# Patient Record
Sex: Female | Born: 1950 | Race: White | Hispanic: No | Marital: Married | Smoking: Former smoker
Health system: Southern US, Community
[De-identification: ages and names within clinical notes are randomized; demographics above are authoritative.]

## PROBLEM LIST (undated history)

## (undated) DIAGNOSIS — M5136 Other intervertebral disc degeneration, lumbar region: Secondary | ICD-10-CM

## (undated) DIAGNOSIS — M1990 Unspecified osteoarthritis, unspecified site: Secondary | ICD-10-CM

## (undated) DIAGNOSIS — K219 Gastro-esophageal reflux disease without esophagitis: Secondary | ICD-10-CM

## (undated) DIAGNOSIS — R569 Unspecified convulsions: Secondary | ICD-10-CM

## (undated) DIAGNOSIS — E119 Type 2 diabetes mellitus without complications: Secondary | ICD-10-CM

## (undated) DIAGNOSIS — R32 Unspecified urinary incontinence: Secondary | ICD-10-CM

## (undated) DIAGNOSIS — IMO0001 Reserved for inherently not codable concepts without codable children: Secondary | ICD-10-CM

## (undated) DIAGNOSIS — K589 Irritable bowel syndrome without diarrhea: Secondary | ICD-10-CM

## (undated) DIAGNOSIS — F32A Depression, unspecified: Secondary | ICD-10-CM

## (undated) DIAGNOSIS — F419 Anxiety disorder, unspecified: Secondary | ICD-10-CM

## (undated) DIAGNOSIS — F329 Major depressive disorder, single episode, unspecified: Secondary | ICD-10-CM

## (undated) DIAGNOSIS — I639 Cerebral infarction, unspecified: Secondary | ICD-10-CM

## (undated) DIAGNOSIS — M797 Fibromyalgia: Secondary | ICD-10-CM

## (undated) DIAGNOSIS — E669 Obesity, unspecified: Secondary | ICD-10-CM

## (undated) DIAGNOSIS — M51369 Other intervertebral disc degeneration, lumbar region without mention of lumbar back pain or lower extremity pain: Secondary | ICD-10-CM

## (undated) DIAGNOSIS — I10 Essential (primary) hypertension: Secondary | ICD-10-CM

## (undated) DIAGNOSIS — E782 Mixed hyperlipidemia: Secondary | ICD-10-CM

## (undated) DIAGNOSIS — R079 Chest pain, unspecified: Secondary | ICD-10-CM

## (undated) DIAGNOSIS — J449 Chronic obstructive pulmonary disease, unspecified: Secondary | ICD-10-CM

## (undated) DIAGNOSIS — J45909 Unspecified asthma, uncomplicated: Secondary | ICD-10-CM

## (undated) DIAGNOSIS — G629 Polyneuropathy, unspecified: Secondary | ICD-10-CM

## (undated) HISTORY — PX: TONSILLECTOMY: SUR1361

## (undated) HISTORY — DX: Essential (primary) hypertension: I10

## (undated) HISTORY — DX: Unspecified urinary incontinence: R32

## (undated) HISTORY — DX: Mixed hyperlipidemia: E78.2

## (undated) HISTORY — DX: Obesity, unspecified: E66.9

## (undated) HISTORY — PX: OTHER SURGICAL HISTORY: SHX169

## (undated) HISTORY — DX: Chronic obstructive pulmonary disease, unspecified: J44.9

## (undated) HISTORY — PX: CHOLECYSTECTOMY: SHX55

## (undated) HISTORY — DX: Irritable bowel syndrome, unspecified: K58.9

## (undated) HISTORY — PX: CARPAL TUNNEL RELEASE: SHX101

## (undated) HISTORY — DX: Unspecified osteoarthritis, unspecified site: M19.90

## (undated) HISTORY — DX: Fibromyalgia: M79.7

## (undated) HISTORY — DX: Type 2 diabetes mellitus without complications: E11.9

## (undated) HISTORY — DX: Chest pain, unspecified: R07.9

## (undated) HISTORY — DX: Gastro-esophageal reflux disease without esophagitis: K21.9

---

## 2002-09-13 ENCOUNTER — Encounter: Admission: RE | Admit: 2002-09-13 | Discharge: 2002-12-12 | Payer: Self-pay

## 2004-08-06 ENCOUNTER — Ambulatory Visit: Payer: Self-pay | Admitting: Internal Medicine

## 2004-08-06 ENCOUNTER — Inpatient Hospital Stay (HOSPITAL_COMMUNITY): Admission: AD | Admit: 2004-08-06 | Discharge: 2004-08-10 | Payer: Self-pay | Admitting: Cardiology

## 2005-05-13 ENCOUNTER — Ambulatory Visit: Payer: Self-pay | Admitting: *Deleted

## 2005-12-13 ENCOUNTER — Ambulatory Visit: Payer: Self-pay | Admitting: Cardiology

## 2006-04-28 ENCOUNTER — Ambulatory Visit: Payer: Self-pay | Admitting: Cardiology

## 2007-03-14 ENCOUNTER — Ambulatory Visit: Payer: Self-pay | Admitting: Cardiology

## 2009-02-22 ENCOUNTER — Ambulatory Visit: Payer: Self-pay | Admitting: Cardiology

## 2009-08-19 ENCOUNTER — Ambulatory Visit: Payer: Self-pay | Admitting: Cardiology

## 2009-12-08 ENCOUNTER — Emergency Department (HOSPITAL_COMMUNITY): Admission: EM | Admit: 2009-12-08 | Discharge: 2009-12-08 | Payer: Self-pay | Admitting: Emergency Medicine

## 2010-01-28 ENCOUNTER — Ambulatory Visit: Payer: Self-pay | Admitting: Cardiology

## 2010-11-05 NOTE — Discharge Summary (Signed)
NAME:  Valerie Wagner, Valerie Wagner NO.:  [id number]   MEDICAL RECORD NO.:  [id number]          PATIENT TYPE:  INP   LOCATION:  4714                         FACILITY:  [facility]   PHYSICIAN:  Charlies Constable, M.D. [facility] DATE OF BIRTH:  June 03, 1951   DATE OF ADMISSION:  08/06/2004  DATE OF DISCHARGE:  08/10/2004                           DISCHARGE SUMMARY - REFERRING   SUMMARY OF HISTORY:  Ms. Alton is a 60 year old white female who presented  with a sudden onset of chest discomfort while at rest which she described as  a sharp pressing substernal discomfort radiating to her left arm associated  with shortness of breath, diaphoresis, nausea, vomiting and palpitations.  Discomfort lasted approximately 30 minutes and she initially attributed this  to unpacking from her move. She presented to the ER, however, symptoms were  relieved with nitroglycerin. She ruled out for myocardial infarction at  [facility].  Due to her multiple risk factors, it was felt that she  should undergo cardiac catheterization.   History is notable for prior cardiac catheterization approximately three to  four years ago at [facility] which was unremarkable. An  adenosine Cardiolite in March 2005, showed EF of 66%, no ischemia. She also  has a history of hypertension, hyperlipidemia, diabetes, morbid obesity and  inactivity.   LABORATORY DATA:  Fasting lipids showed a total cholesterol 232,  triglycerides 311, HDL 35, LDL 135. Admission sodium was 139, potassium 3.9,  BUN 13, creatinine 1.0, glucose 94.  PT 13.0, PTT 26.  H&H 12.8 and 37.6,  normal indices, platelets 297, WBCs 9.5. Subsequent blood work was  essentially unchanged.   Chest two-view showed mild bronchitic changes, questioned 20 lung nodules,  recommend follow-up CT scan.  CT was performed and showed a 5 mm nodular  opacity in the lingula too small to completely characterize.  Felt that it  was benign process in the  absence cancer history, recommend follow-up CT  scan.   HOSPITAL COURSE:  Ms. Mascari was transferred to [facility] for  further evaluation.  She did not have any further problems with chest  discomfort or shortness of breath.  On August 09, 2004, Dr. Riley Kill  performed cardiac catheterization which did not show any coronary artery  disease with EF greater than 60%.  Findings were discussed with Dr. Reuel Boom.  CT was ordered as previously described.  This was negative for acute  processes.  Thus, it was felt that she could be discharged home with  continued cardiac risk factor modifications with her primary care physician.   DISCHARGE DIAGNOSES:  1.  Noncardiac chest discomfort without coronary artery disease on cardiac      catheterization.  2.  Abnormal chest x-ray with CT as previously described, history as      previously.   DISPOSITION:  She is discharged home. She was asked to continue her home  medications. These include:  1.  Xanax 0.5 mg q.8h. p.r.n.  2.  Relafen 750 mg b.i.d.  3.  Flexeril 10 mg q.8h. p.r.n.  4.  __________ 500 mg b.i.d.  5.  Lotensin/HCT  20/25 daily.  6.  Aspirin 81 daily.  7.  Resume Glucophage XR 500 mg four tablets daily on Wednesday.  8.  Advair 500/50 one puff b.i.d.  9.  Lipitor 80 mg q.h.s.  10. Albuterol and Atrovent as previously.   She was advised no lifting, driving, sexual activity or heavy exertion for  two days.  Maintain low salt, fat, cholesterol diet. If she had any problems  with the catheterization site, she was asked to call. She was also asked to  arrange a follow-up appointment with Dr. Reuel Boom and a noncontrast chest CT  in approximately three months for follow-up of her abnormal chest x-ray.      EW/MEDQ  D:  11/02/2004  T:  11/02/2004  Job:  [id number]   cc:   Donzetta Sprung  [address]  [place]  [place] [address]  Fax: [phone]

## 2010-11-05 NOTE — Cardiovascular Report (Signed)
Valerie Wagner, Valerie Wagner NO.:  [id number]   MEDICAL RECORD NO.:  [id number]          PATIENT TYPE:  INP   LOCATION:  4714                         FACILITY:  [facility]   PHYSICIAN:  Arturo Morton. Riley Kill, M.D. [facility] OF BIRTH:  10-03-1950   DATE OF PROCEDURE:  08/09/2004  DATE OF DISCHARGE:                              CARDIAC CATHETERIZATION   INDICATIONS:  Ms. Cliff is a 60 year old who has presented with recurrent  episodes of chest pain. The patient has non-insulin-dependent diabetes  mellitus and hypertension compatible with an adult dysmetabolic profile. She  has significant obesity. The current study was done to assess coronary  anatomy.   PROCEDURES:  1.  Left heart catheterization  2.  Selective coronary arteriography.  3.  Selective left ventriculography.   DESCRIPTION OF THE PROCEDURE:  The patient was brought to the  catheterization laboratory and prepped and draped in the usual fashion. We  were able to use an anterior puncture to gain access to the femoral artery.  We had some difficulty passing the wire. We therefore then used a Smart  needle and were able to gain immediate access and a wire was passed into the  central aorta. A 6-French sheath was placed. Central aortic and left atrial  pressures were measured with a pigtail. Ventriculography was performed in  the RAO projection. Following ventriculography the pigtail was pulled back  and removed. Coronary arteriography was then performed without complication.   Following this, she was taken to the holding area in satisfactory clinical  condition for sheath removal.   HEMODYNAMIC DATA:  1.  Central aortic pressure 130/80, mean 102.  2.  Left ventricle 120/12.  3.  No gradient pullback across aortic valve.   ANGIOGRAPHIC DATA:  1.  Ventriculography was performed in the RAO projection because of      ventricular ectopy. Ejection fraction could not be calculated, but      appeared to be vigorous. No  definite wall motion abnormalities were      seen.  2.  The left main was free of critical disease.  3.  The LAD courses to the apex. There is minimal luminal irregularity, but      no significant focal areas of stenosis. The LAD wraps the apex. There is      insignificant diagonal branches and a major septal perforator.  4.  The circumflex provides a first marginal branch and then a second      marginal branch and a fairly large AV circumflex that provides a      posterolateral wall. No significant __________ obstruction is noted.  5.  The right coronary artery provides a single PDA. The RCA is without      significant focal narrowing.   CONCLUSIONS:  1.  Preserved left ventricular function.  2.  Recurrent episodes of substernal chest pain.  3.  Well-preserved left ventricular function.  4.  No critical coronary obstruction.   DISPOSITION:  1.  Supportive treatment.  2.  Check D-dimer.  3.  The patient does have an abnormal CT with small lung nodules which need  a three-month follow-up. Follow-up will be arranged with her primary      care physician, Dr. Reuel Boom.      TDS/MEDQ  D:  08/09/2004  T:  08/09/2004  Job:  [id number]   cc:   Donzetta Sprung  [address]  [place]  [place] [address]  Fax: [phone]   CV Lab   Arvilla Meres, M.D. [facility]

## 2011-12-21 DIAGNOSIS — R079 Chest pain, unspecified: Secondary | ICD-10-CM

## 2011-12-23 DIAGNOSIS — R079 Chest pain, unspecified: Secondary | ICD-10-CM

## 2012-01-06 ENCOUNTER — Encounter: Payer: Self-pay | Admitting: Cardiology

## 2012-01-16 ENCOUNTER — Encounter: Payer: Medicaid Other | Admitting: Cardiology

## 2012-02-09 ENCOUNTER — Encounter: Payer: Self-pay | Admitting: Cardiology

## 2012-02-09 ENCOUNTER — Ambulatory Visit (INDEPENDENT_AMBULATORY_CARE_PROVIDER_SITE_OTHER): Payer: Medicaid Other | Admitting: Cardiology

## 2012-02-09 VITALS — BP 131/71 | HR 66 | Ht 63.0 in | Wt 289.0 lb

## 2012-02-09 DIAGNOSIS — I10 Essential (primary) hypertension: Secondary | ICD-10-CM

## 2012-02-09 DIAGNOSIS — R0789 Other chest pain: Secondary | ICD-10-CM

## 2012-02-09 DIAGNOSIS — E782 Mixed hyperlipidemia: Secondary | ICD-10-CM

## 2012-02-09 NOTE — Progress Notes (Signed)
Clinical Summary Ms. Shevchenko is a 61 y.o.female presenting for hospital followup. She was seen in consultation back in July with atypical chest pain at [facility]. She ruled out for myocardial nfarction and underwent a surface echocardiogram demonstrating LVEF of 60% without wall motion abnormalities or significant valvular abnormalities. She was not able to tolerate a dobutamine echocardiogram related to significant nausea and emesis, and the study was canceled.  She presents today stating that she feels much better, has not had any recurrent chest pain symptoms. We discussed the possibility of ultimately considering a followup Lexiscan Myoview for assessment of ischemia, however at this point adopted the strategy of observation since her symptoms had resolved, and she has had fairly consistent reassuring cardiac testing over time. Recommended continued followup in the interim with Dr. Reuel Boom for risk factor modification.  Allergies  Allergen Reactions  . Bee Venom   . Chocolate   . Ivp Dye (Iodinated Diagnostic Agents)   . Nsaids     Shortness of breath.   . Penicillins     Rash   . Propoxyphene And Methadone     Current Outpatient Prescriptions  Medication Sig Dispense Refill  . ALPRAZolam (XANAX) 0.5 MG tablet Take 0.5 mg by mouth 3 (three) times daily as needed.      Marland Kitchen atorvastatin (LIPITOR) 80 MG tablet Take 80 mg by mouth daily.      . benazepril-hydrochlorthiazide (LOTENSIN HCT) 20-25 MG per tablet Take 1 tablet by mouth daily.      Marland Kitchen diltiazem (CARDIZEM CD) 240 MG 24 hr capsule Take 240 mg by mouth daily.      . Fluticasone-Salmeterol (ADVAIR) 500-50 MCG/DOSE AEPB Inhale 1 puff into the lungs every 12 (twelve) hours.      Marland Kitchen glipiZIDE (GLUCOTROL XL) 10 MG 24 hr tablet Take 10 mg by mouth daily.      Marland Kitchen HYDROcodone-acetaminophen (NORCO/VICODIN) 5-325 MG per tablet Take 1 tablet by mouth 4 times daily as needed.      Marland Kitchen ipratropium (ATROVENT HFA) 17 MCG/ACT inhaler Inhale 1 puff into  the lungs 4 (four) times daily as needed.      Marland Kitchen ipratropium-albuterol (DUONEB) 0.5-2.5 (3) MG/3ML SOLN Take 3 mLs by nebulization every 6 (six) hours as needed.      . nabumetone (RELAFEN) 750 MG tablet Take 750 mg by mouth 2 (two) times daily.      . nitroGLYCERIN (NITROSTAT) 0.4 MG SL tablet Place 0.4 mg under the tongue every 5 (five) minutes as needed.      . pantoprazole (PROTONIX) 40 MG tablet Take 40 mg by mouth daily.      . pioglitazone (ACTOS) 45 MG tablet Take 45 mg by mouth daily.      . solifenacin (VESICARE) 10 MG tablet Take 5 mg by mouth daily.      Marland Kitchen tiotropium (SPIRIVA) 18 MCG inhalation capsule Place 18 mcg into inhaler and inhale daily.      . traMADol (ULTRAM) 50 MG tablet Take 50 mg by mouth every 8 (eight) hours as needed.        Past Medical History  Diagnosis Date  . Chest pain     Reassuring workup of her time including negative cardiac catheterization and stress testing  . Type 2 diabetes mellitus   . Mixed hyperlipidemia   . Obesity   . Essential hypertension, benign   . COPD (chronic obstructive pulmonary disease)   . DJD (degenerative joint disease)   . Urinary incontinence   . GERD (  gastroesophageal reflux disease)   . IBS (irritable bowel syndrome)   . Fibromyalgia     Social History Ms. Ransome reports that she quit smoking about 29 years ago. Her smoking use included Cigarettes. She has a 2 pack-year smoking history. She has never used smokeless tobacco. Ms. Leitch reports that she does not drink alcohol.  Review of Systems No palpitations, dizziness, syncope. Otherwise negative.  Physical Examination Filed Vitals:   02/09/12 1417  BP: 131/71  Pulse: 66   Obese woman in no acute distress. HEENT: Conjunctiva and lids normal, oropharynx clear. Neck: Supple, no elevated JVP or carotid bruits, no thyromegaly. Lungs: Clear to auscultation, nonlabored breathing at rest. Cardiac: Regular rate and rhythm, no S3 or significant systolic murmur, no  pericardial rub. Abdomen: Soft, nontender, bowel sounds present, no guarding or rebound. Extremities: No pitting edema, distal pulses 2+.    Problem List and Plan   Atypical chest pain Presently resolved. Although she does have multiple cardiac risk factors, previous testing has been reassuring including both angiography and noninvasive imaging. At this point would recommend observation, and follow up with Dr. Reuel Boom for risk factor modification. If she has progressive symptoms, we can consider a followup Lexiscan Myoview.  Essential hypertension, benign Blood pressure is reasonable today. Continue medical therapy.  Mixed hyperlipidemia On Lipitor. Goal LDL should be close to 70 for aggressive management.    Jonelle Sidle, M.D., F.A.C.C.

## 2012-02-09 NOTE — Assessment & Plan Note (Signed)
Presently resolved. Although she does have multiple cardiac risk factors, previous testing has been reassuring including both angiography and noninvasive imaging. At this point would recommend observation, and follow up with Dr. Reuel Boom for risk factor modification. If she has progressive symptoms, we can consider a followup Lexiscan Myoview.

## 2012-02-09 NOTE — Assessment & Plan Note (Signed)
Blood pressure is reasonable today. Continue medical therapy.

## 2012-02-09 NOTE — Assessment & Plan Note (Signed)
On Lipitor. Goal LDL should be close to 70 for aggressive management.

## 2012-02-09 NOTE — Patient Instructions (Addendum)

## 2012-08-09 ENCOUNTER — Ambulatory Visit: Payer: Medicaid Other | Admitting: Cardiology

## 2012-09-24 ENCOUNTER — Ambulatory Visit (INDEPENDENT_AMBULATORY_CARE_PROVIDER_SITE_OTHER): Payer: Medicaid Other | Admitting: Cardiology

## 2012-09-24 ENCOUNTER — Encounter: Payer: Self-pay | Admitting: Cardiology

## 2012-09-24 VITALS — BP 164/92 | HR 74 | Ht 63.0 in | Wt 294.8 lb

## 2012-09-24 DIAGNOSIS — R0789 Other chest pain: Secondary | ICD-10-CM

## 2012-09-24 DIAGNOSIS — E782 Mixed hyperlipidemia: Secondary | ICD-10-CM

## 2012-09-24 DIAGNOSIS — I10 Essential (primary) hypertension: Secondary | ICD-10-CM

## 2012-09-24 NOTE — Patient Instructions (Addendum)

## 2012-09-24 NOTE — Assessment & Plan Note (Signed)
Blood pressure is elevated today. Weight loss would be beneficial, also sodium restriction. Continue medical therapy and follow up with Dr. Reuel Boom.

## 2012-09-24 NOTE — Progress Notes (Signed)
Clinical Summary Valerie Wagner is a 62 y.o.female last seen in August 2013. Reports no significant chest pain symptoms, has not used any nitroglycerin since I last saw her.  We have discussed possibility of a followup Lexiscan Myoview were she to have progressive chest pain symptoms, and this has not been the case. Previous cardiac testing was reassuring.  Continue to recommend basic risk factor modification, and followup with Dr. Reuel Boom. She sees him on a regular basis.  Allergies  Allergen Reactions  . Bee Venom   . Chocolate   . Ivp Dye (Iodinated Diagnostic Agents)   . Nsaids     Shortness of breath.   . Penicillins     Rash   . Propoxyphene And Methadone     Current Outpatient Prescriptions  Medication Sig Dispense Refill  . ALPRAZolam (XANAX) 0.5 MG tablet Take 0.5 mg by mouth 3 (three) times daily as needed.      Marland Kitchen atorvastatin (LIPITOR) 80 MG tablet Take 80 mg by mouth daily.      . benazepril-hydrochlorthiazide (LOTENSIN HCT) 20-12.5 MG per tablet Take 1 tablet by mouth daily.      Marland Kitchen diltiazem (CARDIZEM CD) 240 MG 24 hr capsule Take 240 mg by mouth daily.      Marland Kitchen EPINEPHrine (EPIPEN JR) 0.15 MG/0.3ML injection Inject 0.15 mg into the muscle as needed for anaphylaxis.      Marland Kitchen Fluticasone-Salmeterol (ADVAIR) 500-50 MCG/DOSE AEPB Inhale 1 puff into the lungs every 12 (twelve) hours.      Marland Kitchen HYDROcodone-acetaminophen (NORCO/VICODIN) 5-325 MG per tablet Take 1 tablet by mouth 4 times daily as needed.      . insulin glargine (LANTUS) 100 UNIT/ML injection Inject 22 Units into the skin at bedtime.      Marland Kitchen ipratropium (ATROVENT HFA) 17 MCG/ACT inhaler Inhale 1 puff into the lungs 4 (four) times daily as needed.      Marland Kitchen ipratropium-albuterol (DUONEB) 0.5-2.5 (3) MG/3ML SOLN Take 3 mLs by nebulization every 6 (six) hours as needed.      . metFORMIN (GLUCOPHAGE) 500 MG tablet Take 2,000 mg by mouth daily.      . nabumetone (RELAFEN) 750 MG tablet Take 1,500 mg by mouth daily.       .  nitroGLYCERIN (NITROSTAT) 0.4 MG SL tablet Place 0.4 mg under the tongue every 5 (five) minutes as needed.      . pantoprazole (PROTONIX) 40 MG tablet Take 40 mg by mouth daily.      . pregabalin (LYRICA) 50 MG capsule Take 50 mg by mouth 2 (two) times daily.      Marland Kitchen tiotropium (SPIRIVA) 18 MCG inhalation capsule Place 18 mcg into inhaler and inhale daily.      . traMADol (ULTRAM) 50 MG tablet Take 50 mg by mouth every 8 (eight) hours as needed.       No current facility-administered medications for this visit.    Past Medical History  Diagnosis Date  . Chest pain     Reassuring workup of her time including negative cardiac catheterization and stress testing  . Type 2 diabetes mellitus   . Mixed hyperlipidemia   . Obesity   . Essential hypertension, benign   . COPD (chronic obstructive pulmonary disease)   . DJD (degenerative joint disease)   . Urinary incontinence   . GERD (gastroesophageal reflux disease)   . IBS (irritable bowel syndrome)   . Fibromyalgia     Social History Valerie Wagner reports that she quit smoking about 30  years ago. Her smoking use included Cigarettes. She has a 2 pack-year smoking history. She has never used smokeless tobacco. Valerie Wagner reports that she does not drink alcohol.  Review of Systems No palpitations. Reports an episode of amnesia recently after a stressful situation. States that she is having this further evaluated. Also to see Dr. Andrey Campanile for possible sleep testing. Otherwise negative.  Physical Examination Filed Vitals:   09/24/12 1348  BP: 164/92  Pulse: 74   Filed Weights   09/24/12 1348  Weight: 294 lb 12.8 oz (133.72 kg)    Obese woman in no acute distress.  HEENT: Conjunctiva and lids normal, oropharynx clear.  Neck: Supple, no elevated JVP or carotid bruits, no thyromegaly.  Lungs: Clear to auscultation, nonlabored breathing at rest.  Cardiac: Regular rate and rhythm, no S3 or significant systolic murmur, no pericardial rub.    Abdomen: Soft, nontender, bowel sounds present, no guarding or rebound.  Extremities: No pitting edema, distal pulses 2+.   Problem List and Plan   Atypical chest pain Has not been recurrent or progressive. Continue to recommend risk factor modification, followup with Dr. Reuel Boom. Followup stress testing could be considered if her symptoms were to progress.  Essential hypertension, benign Blood pressure is elevated today. Weight loss would be beneficial, also sodium restriction. Continue medical therapy and follow up with Dr. Reuel Boom.  Mixed hyperlipidemia She continues on high-dose Lipitor, followed by Dr. Reuel Boom. With diabetes, LDL should be under 100.    Jonelle Sidle, M.D., F.A.C.C.

## 2012-09-24 NOTE — Assessment & Plan Note (Signed)
Has not been recurrent or progressive. Continue to recommend risk factor modification, followup with Dr. Reuel Boom. Followup stress testing could be considered if her symptoms were to progress.

## 2012-09-24 NOTE — Assessment & Plan Note (Signed)
She continues on high-dose Lipitor, followed by Dr. Reuel Boom. With diabetes, LDL should be under 100.

## 2013-09-24 ENCOUNTER — Emergency Department (HOSPITAL_COMMUNITY): Payer: Medicaid Other

## 2013-09-24 ENCOUNTER — Encounter (HOSPITAL_COMMUNITY): Payer: Self-pay | Admitting: Emergency Medicine

## 2013-09-24 ENCOUNTER — Emergency Department (HOSPITAL_COMMUNITY)
Admission: EM | Admit: 2013-09-24 | Discharge: 2013-09-25 | Disposition: A | Discharge status: Home / Self Care | Payer: Medicaid Other | Attending: Emergency Medicine | Admitting: Emergency Medicine

## 2013-09-24 DIAGNOSIS — Z8719 Personal history of other diseases of the digestive system: Secondary | ICD-10-CM | POA: Insufficient documentation

## 2013-09-24 DIAGNOSIS — J45901 Unspecified asthma with (acute) exacerbation: Principal | ICD-10-CM

## 2013-09-24 DIAGNOSIS — Z9889 Other specified postprocedural states: Secondary | ICD-10-CM | POA: Insufficient documentation

## 2013-09-24 DIAGNOSIS — J40 Bronchitis, not specified as acute or chronic: Secondary | ICD-10-CM

## 2013-09-24 DIAGNOSIS — E119 Type 2 diabetes mellitus without complications: Secondary | ICD-10-CM | POA: Insufficient documentation

## 2013-09-24 DIAGNOSIS — IMO0002 Reserved for concepts with insufficient information to code with codable children: Secondary | ICD-10-CM | POA: Insufficient documentation

## 2013-09-24 DIAGNOSIS — Z8742 Personal history of other diseases of the female genital tract: Secondary | ICD-10-CM | POA: Insufficient documentation

## 2013-09-24 DIAGNOSIS — Z87891 Personal history of nicotine dependence: Secondary | ICD-10-CM | POA: Insufficient documentation

## 2013-09-24 DIAGNOSIS — Z8669 Personal history of other diseases of the nervous system and sense organs: Secondary | ICD-10-CM | POA: Insufficient documentation

## 2013-09-24 DIAGNOSIS — Z880 Allergy status to penicillin: Secondary | ICD-10-CM | POA: Insufficient documentation

## 2013-09-24 DIAGNOSIS — Z79899 Other long term (current) drug therapy: Secondary | ICD-10-CM | POA: Insufficient documentation

## 2013-09-24 DIAGNOSIS — Z794 Long term (current) use of insulin: Secondary | ICD-10-CM | POA: Insufficient documentation

## 2013-09-24 DIAGNOSIS — E785 Hyperlipidemia, unspecified: Secondary | ICD-10-CM | POA: Insufficient documentation

## 2013-09-24 DIAGNOSIS — J441 Chronic obstructive pulmonary disease with (acute) exacerbation: Secondary | ICD-10-CM | POA: Insufficient documentation

## 2013-09-24 DIAGNOSIS — I10 Essential (primary) hypertension: Secondary | ICD-10-CM | POA: Insufficient documentation

## 2013-09-24 DIAGNOSIS — E669 Obesity, unspecified: Secondary | ICD-10-CM | POA: Insufficient documentation

## 2013-09-24 HISTORY — DX: Polyneuropathy, unspecified: G62.9

## 2013-09-24 HISTORY — DX: Unspecified asthma, uncomplicated: J45.909

## 2013-09-24 LAB — CBC WITH DIFFERENTIAL/PLATELET
BASOS ABS: 0 10*3/uL (ref 0.0–0.1)
Basophils Relative: 0 % (ref 0–1)
Eosinophils Absolute: 0.5 10*3/uL (ref 0.0–0.7)
Eosinophils Relative: 4 % (ref 0–5)
HEMATOCRIT: 39.3 % (ref 36.0–46.0)
Hemoglobin: 13.4 g/dL (ref 12.0–15.0)
LYMPHS ABS: 4.5 10*3/uL — AB (ref 0.7–4.0)
Lymphocytes Relative: 42 % (ref 12–46)
MCH: 31.1 pg (ref 26.0–34.0)
MCHC: 34.1 g/dL (ref 30.0–36.0)
MCV: 91.2 fL (ref 78.0–100.0)
Monocytes Absolute: 0.4 10*3/uL (ref 0.1–1.0)
Monocytes Relative: 4 % (ref 3–12)
NEUTROS ABS: 5.4 10*3/uL (ref 1.7–7.7)
Neutrophils Relative %: 50 % (ref 43–77)
PLATELETS: 256 10*3/uL (ref 150–400)
RBC: 4.31 MIL/uL (ref 3.87–5.11)
RDW: 12.1 % (ref 11.5–15.5)
WBC: 10.9 10*3/uL — AB (ref 4.0–10.5)

## 2013-09-24 LAB — COMPREHENSIVE METABOLIC PANEL
ALK PHOS: 88 U/L (ref 39–117)
ALT: 25 U/L (ref 0–35)
AST: 22 U/L (ref 0–37)
Albumin: 3.9 g/dL (ref 3.5–5.2)
BILIRUBIN TOTAL: 0.2 mg/dL — AB (ref 0.3–1.2)
BUN: 14 mg/dL (ref 6–23)
CHLORIDE: 98 meq/L (ref 96–112)
CO2: 27 meq/L (ref 19–32)
Calcium: 10.1 mg/dL (ref 8.4–10.5)
Creatinine, Ser: 0.76 mg/dL (ref 0.50–1.10)
GFR calc Af Amer: >90 mL/min (ref >90)
GFR calc non Af Amer: 90 mL/min — ABNORMAL LOW (ref >90)
Glucose, Bld: 385 mg/dL — ABNORMAL HIGH (ref 70–99)
Potassium: 3.9 mEq/L (ref 3.7–5.3)
Sodium: 141 mEq/L (ref 137–147)
Total Protein: 7.4 g/dL (ref 6.0–8.3)

## 2013-09-24 LAB — TROPONIN I: Troponin I: <0.3 ng/mL (ref <0.30)

## 2013-09-24 LAB — PRO B NATRIURETIC PEPTIDE: Pro B Natriuretic peptide (BNP): 239.8 pg/mL — ABNORMAL HIGH (ref 0–125)

## 2013-09-24 MED ORDER — LORAZEPAM 2 MG/ML IJ SOLN
0.5000 mg | Freq: Once | INTRAMUSCULAR | Status: AC
  Administered 2013-09-24: 0.5 mg via INTRAVENOUS
  Filled 2013-09-24: qty 1

## 2013-09-24 MED ORDER — IPRATROPIUM-ALBUTEROL 0.5-2.5 (3) MG/3ML IN SOLN
3.0000 mL | RESPIRATORY_TRACT | Status: DC
  Filled 2013-09-24: qty 3

## 2013-09-24 MED ORDER — LEVOFLOXACIN 500 MG PO TABS: 500.0000 mg | ORAL_TABLET | Freq: Every day | ORAL | Status: DC

## 2013-09-24 MED ORDER — IPRATROPIUM-ALBUTEROL 0.5-2.5 (3) MG/3ML IN SOLN
3.0000 mL | Freq: Once | RESPIRATORY_TRACT | Status: AC
  Administered 2013-09-24: 3 mL via RESPIRATORY_TRACT

## 2013-09-24 MED ORDER — RACEPINEPHRINE HCL 2.25 % IN NEBU
0.5000 mL | INHALATION_SOLUTION | Freq: Once | RESPIRATORY_TRACT | Status: AC
  Administered 2013-09-24: 0.5 mL via RESPIRATORY_TRACT
  Filled 2013-09-24: qty 0.5

## 2013-09-24 MED ORDER — LEVOFLOXACIN 500 MG PO TABS
500.0000 mg | ORAL_TABLET | Freq: Once | ORAL | Status: AC
  Administered 2013-09-24: 500 mg via ORAL
  Filled 2013-09-24: qty 1

## 2013-09-24 MED ORDER — METHYLPREDNISOLONE SODIUM SUCC 125 MG IJ SOLR
125.0000 mg | Freq: Once | INTRAMUSCULAR | Status: AC
  Administered 2013-09-24: 125 mg via INTRAVENOUS
  Filled 2013-09-24: qty 2

## 2013-09-24 MED ORDER — ALBUTEROL SULFATE (2.5 MG/3ML) 0.083% IN NEBU
2.5000 mg | INHALATION_SOLUTION | Freq: Once | RESPIRATORY_TRACT | Status: AC
  Administered 2013-09-24: 2.5 mg via RESPIRATORY_TRACT
  Filled 2013-09-24: qty 3

## 2013-09-24 MED ORDER — PREDNISONE 20 MG PO TABS: ORAL_TABLET | ORAL | Status: DC

## 2013-09-24 NOTE — ED Notes (Signed)
Pt reports SOB began about noon.  Reports that a cigar triggered an asthma attack.  States that she has been using both her nebulizer and inhalers with no relief.

## 2013-09-24 NOTE — ED Provider Notes (Signed)
CSN: [id number]     Arrival date & time 09/24/13  2056 History  This chart was scribed for Benny Lennert, MD by Blanchard Kelch, ED Scribe. The patient was seen in room APA10/APA10. Patient's care was started at 9:49 PM.    Chief Complaint  Patient presents with  . Shortness of Breath     Patient is a 63 y.o. female presenting with shortness of breath. The history is provided by the patient. No language interpreter was used.  Shortness of Breath Duration:  10 hours Timing:  Constant Progression:  Unchanged Relieved by:  Nothing Ineffective treatments:  Inhaler Associated symptoms: chest pain (chest wall) and cough   Associated symptoms: no abdominal pain, no headaches and no rash     HPI Comments: Valerie Wagner is a 63 y.o. female who presents to the Emergency Department complaining of constant shortness of breath that began ten hours ago. She has had associated productive cough with yellow/green sputum and chills. She also reports chest wall tenderness from coughing. She used her nebulizer and inhaler at home without relief.    Past Medical History  Diagnosis Date  . Chest pain     Reassuring workup of her time including negative cardiac catheterization and stress testing  . Type 2 diabetes mellitus   . Mixed hyperlipidemia   . Obesity   . Essential hypertension, benign   . COPD (chronic obstructive pulmonary disease)   . DJD (degenerative joint disease)   . Urinary incontinence   . GERD (gastroesophageal reflux disease)   . IBS (irritable bowel syndrome)   . Fibromyalgia   . Asthma   . Neuropathy    Past Surgical History  Procedure Laterality Date  . Tonsillectomy    . Hysterectomy -- unknown    . Cholecystectomy    . Carpal tunnel release      Right  . Left thumb surgery    . Cesarean section     Family History  Problem Relation Age of Onset  . Coronary artery disease Father   . Heart attack Father     Died age 91   History  Substance Use Topics  .  Smoking status: Former Smoker -- 1.00 packs/day for 2 years    Types: Cigarettes    Quit date: 06/20/1982  . Smokeless tobacco: Never Used  . Alcohol Use: No   OB History   Grav Para Term Preterm Abortions TAB SAB Ect Mult Living                 Review of Systems  Constitutional: Positive for chills. Negative for appetite change and fatigue.  HENT: Negative for congestion, ear discharge and sinus pressure.   Eyes: Negative for discharge.  Respiratory: Positive for cough and shortness of breath.   Cardiovascular: Positive for chest pain (chest wall).  Gastrointestinal: Negative for abdominal pain and diarrhea.  Genitourinary: Negative for frequency and hematuria.  Musculoskeletal: Negative for back pain.  Skin: Negative for rash.  Neurological: Negative for seizures and headaches.  Psychiatric/Behavioral: Negative for hallucinations.      Allergies  Nsaids; Bee venom; Chocolate; Darvon; Ivp Wagner; and Penicillins  Home Medications   Current Outpatient Rx  Name  Route  Sig  Dispense  Refill  . albuterol (VENTOLIN HFA) 108 (90 BASE) MCG/ACT inhaler   Inhalation   Inhale 1 puff into the lungs every 6 (six) hours as needed for wheezing or shortness of breath.         . ALPRAZolam Prudy Feeler)  0.5 MG tablet   Oral   Take 0.5 mg by mouth 3 (three) times daily as needed for anxiety or sleep.          Marland Kitchen. atorvastatin (LIPITOR) 80 MG tablet   Oral   Take 80 mg by mouth daily.         . benazepril-hydrochlorthiazide (LOTENSIN HCT) 20-12.5 MG per tablet   Oral   Take 1 tablet by mouth daily.         . cyclobenzaprine (FLEXERIL) 10 MG tablet   Oral   Take 10 mg by mouth at bedtime as needed for muscle spasms.         Marland Kitchen. EPINEPHrine (EPIPEN JR) 0.15 MG/0.3ML injection   Intramuscular   Inject 0.15 mg into the muscle as needed for anaphylaxis.         Marland Kitchen. Fluticasone-Salmeterol (ADVAIR) 500-50 MCG/DOSE AEPB   Inhalation   Inhale 1 puff into the lungs every 12 (twelve)  hours.         Marland Kitchen. glipiZIDE (GLUCOTROL XL) 10 MG 24 hr tablet   Oral   Take 10 mg by mouth daily with breakfast.         . HYDROcodone-acetaminophen (NORCO/VICODIN) 5-325 MG per tablet   Oral   Take 1 tablet by mouth 4 times daily as needed for moderate pain or severe pain.          Marland Kitchen. insulin glargine (LANTUS) 100 UNIT/ML injection   Subcutaneous   Inject 30 Units into the skin at bedtime.          Marland Kitchen. ipratropium (ATROVENT HFA) 17 MCG/ACT inhaler   Inhalation   Inhale 1 puff into the lungs 4 (four) times daily as needed.         Marland Kitchen. ipratropium-albuterol (DUONEB) 0.5-2.5 (3) MG/3ML SOLN   Nebulization   Take 3 mLs by nebulization every 6 (six) hours as needed (for shortness of breath).          . nitroGLYCERIN (NITROSTAT) 0.4 MG SL tablet   Sublingual   Place 0.4 mg under the tongue every 5 (five) minutes as needed.         . pregabalin (LYRICA) 50 MG capsule   Oral   Take 50 mg by mouth 2 (two) times daily.         Marland Kitchen. tiotropium (SPIRIVA) 18 MCG inhalation capsule   Inhalation   Place 18 mcg into inhaler and inhale daily.         . traMADol (ULTRAM) 50 MG tablet   Oral   Take 50 mg by mouth every 8 (eight) hours as needed for moderate pain.           Triage Vitals: BP 149/79  Pulse 76  Temp(Src) 97.9 F (36.6 C) (Oral)  Resp 26  Ht 5\' 3"  (1.6 m)  Wt 295 lb (133.811 kg)  BMI 52.27 kg/m2  SpO2 97%  Physical Exam  Nursing note and vitals reviewed. Constitutional: She is oriented to person, place, and time. She appears well-developed.  Obese  HENT:  Head: Normocephalic.  Eyes: Conjunctivae and EOM are normal. No scleral icterus.  Neck: Neck supple. No thyromegaly present.  Cardiovascular: Normal rate and regular rhythm.  Exam reveals no gallop and no friction rub.   No murmur heard. Pulmonary/Chest: No stridor. She exhibits no tenderness.  Moderate stridor.  Abdominal: She exhibits no distension. There is no tenderness. There is no rebound.   Musculoskeletal: Normal range of motion. She exhibits no edema.  Lymphadenopathy:    She has no cervical adenopathy.  Neurological: She is oriented to person, place, and time. She exhibits normal muscle tone. Coordination normal.  Skin: No rash noted. No erythema.  Psychiatric: She has a normal mood and affect. Her behavior is normal.    ED Course  Procedures (including critical care time)  DIAGNOSTIC STUDIES: Oxygen Saturation is 97% on room air, adequate by my interpretation.    COORDINATION OF CARE: 10:33 PM -Ordered CBC, CMP, Troponin I, BNP, EKG and chest x-ray. Patient verbalizes understanding and agrees with treatment plan.    Labs Review Labs Reviewed  CBC WITH DIFFERENTIAL - Abnormal; Notable for the following:    WBC 10.9 (*)    Lymphs Abs 4.5 (*)    All other components within normal limits  COMPREHENSIVE METABOLIC PANEL  TROPONIN I  PRO B NATRIURETIC PEPTIDE   Imaging Review Dg Chest Portable 1 View  09/24/2013   CLINICAL DATA:  Shortness of breath and wheezing. History of asthma and smoking.  EXAM: PORTABLE CHEST - 1 VIEW  COMPARISON:  Chest radiograph performed 07/06/2013  FINDINGS: The lungs are well-aerated. Mild vascular congestion is noted. There is no evidence of pleural effusion or pneumothorax.  The cardiomediastinal silhouette is within normal limits. No acute osseous abnormalities are seen.  IMPRESSION: Mild vascular congestion noted; lungs remain grossly clear.   Electronically Signed   By: Roanna Raider M.D.   On: 09/24/2013 22:13     EKG Interpretation None      MDM   Final diagnoses:  None    Pt improved with tx.  Follow up with pcp in 2 days.  tx with levaquin and prednisone The chart was scribed for me under my direct supervision.  I personally performed the history, physical, and medical decision making and all procedures in the evaluation of this patient.Benny Lennert, MD 09/24/13 [phone]

## 2013-09-24 NOTE — Discharge Instructions (Signed)
Follow up with your md in 2 days for recheck 

## 2013-09-25 MED ORDER — IPRATROPIUM-ALBUTEROL 0.5-2.5 (3) MG/3ML IN SOLN
RESPIRATORY_TRACT | Status: AC
  Filled 2013-09-25: qty 3

## 2013-09-25 NOTE — ED Notes (Signed)
Pt discharged at 0030 on 09/25/13. Unable to document at that time due to system downtime.

## 2013-10-14 ENCOUNTER — Emergency Department (HOSPITAL_COMMUNITY)
Admission: EM | Admit: 2013-10-14 | Discharge: 2013-10-14 | Disposition: A | Discharge status: Home / Self Care | Payer: Medicaid Other | Attending: Emergency Medicine | Admitting: Emergency Medicine

## 2013-10-14 ENCOUNTER — Emergency Department (HOSPITAL_COMMUNITY): Payer: Medicaid Other

## 2013-10-14 ENCOUNTER — Encounter (HOSPITAL_COMMUNITY): Payer: Self-pay | Admitting: Emergency Medicine

## 2013-10-14 DIAGNOSIS — Z79899 Other long term (current) drug therapy: Secondary | ICD-10-CM | POA: Insufficient documentation

## 2013-10-14 DIAGNOSIS — Z794 Long term (current) use of insulin: Secondary | ICD-10-CM | POA: Insufficient documentation

## 2013-10-14 DIAGNOSIS — J449 Chronic obstructive pulmonary disease, unspecified: Secondary | ICD-10-CM | POA: Insufficient documentation

## 2013-10-14 DIAGNOSIS — J4489 Other specified chronic obstructive pulmonary disease: Secondary | ICD-10-CM | POA: Insufficient documentation

## 2013-10-14 DIAGNOSIS — N200 Calculus of kidney: Secondary | ICD-10-CM | POA: Insufficient documentation

## 2013-10-14 DIAGNOSIS — E669 Obesity, unspecified: Secondary | ICD-10-CM | POA: Insufficient documentation

## 2013-10-14 DIAGNOSIS — E119 Type 2 diabetes mellitus without complications: Secondary | ICD-10-CM | POA: Insufficient documentation

## 2013-10-14 DIAGNOSIS — I10 Essential (primary) hypertension: Secondary | ICD-10-CM | POA: Insufficient documentation

## 2013-10-14 DIAGNOSIS — Z8669 Personal history of other diseases of the nervous system and sense organs: Secondary | ICD-10-CM | POA: Insufficient documentation

## 2013-10-14 DIAGNOSIS — E785 Hyperlipidemia, unspecified: Secondary | ICD-10-CM | POA: Insufficient documentation

## 2013-10-14 DIAGNOSIS — Z880 Allergy status to penicillin: Secondary | ICD-10-CM | POA: Insufficient documentation

## 2013-10-14 DIAGNOSIS — M1990 Unspecified osteoarthritis, unspecified site: Secondary | ICD-10-CM | POA: Insufficient documentation

## 2013-10-14 DIAGNOSIS — Z87891 Personal history of nicotine dependence: Secondary | ICD-10-CM | POA: Insufficient documentation

## 2013-10-14 DIAGNOSIS — Z8719 Personal history of other diseases of the digestive system: Secondary | ICD-10-CM | POA: Insufficient documentation

## 2013-10-14 DIAGNOSIS — Z792 Long term (current) use of antibiotics: Secondary | ICD-10-CM | POA: Insufficient documentation

## 2013-10-14 DIAGNOSIS — IMO0002 Reserved for concepts with insufficient information to code with codable children: Secondary | ICD-10-CM | POA: Insufficient documentation

## 2013-10-14 LAB — CBC WITH DIFFERENTIAL/PLATELET
Basophils Absolute: 0.1 10*3/uL (ref 0.0–0.1)
Basophils Relative: 0 % (ref 0–1)
EOS ABS: 0.5 10*3/uL (ref 0.0–0.7)
Eosinophils Relative: 4 % (ref 0–5)
HEMATOCRIT: 43.6 % (ref 36.0–46.0)
HEMOGLOBIN: 14.5 g/dL (ref 12.0–15.0)
LYMPHS ABS: 3.9 10*3/uL (ref 0.7–4.0)
Lymphocytes Relative: 32 % (ref 12–46)
MCH: 30.3 pg (ref 26.0–34.0)
MCHC: 33.3 g/dL (ref 30.0–36.0)
MCV: 91 fL (ref 78.0–100.0)
MONOS PCT: 4 % (ref 3–12)
Monocytes Absolute: 0.5 10*3/uL (ref 0.1–1.0)
NEUTROS PCT: 60 % (ref 43–77)
Neutro Abs: 7.4 10*3/uL (ref 1.7–7.7)
Platelets: 294 10*3/uL (ref 150–400)
RBC: 4.79 MIL/uL (ref 3.87–5.11)
RDW: 12.4 % (ref 11.5–15.5)
WBC: 12.4 10*3/uL — ABNORMAL HIGH (ref 4.0–10.5)

## 2013-10-14 LAB — COMPREHENSIVE METABOLIC PANEL
ALK PHOS: 95 U/L (ref 39–117)
ALT: 36 U/L — ABNORMAL HIGH (ref 0–35)
AST: 38 U/L — ABNORMAL HIGH (ref 0–37)
Albumin: 3.9 g/dL (ref 3.5–5.2)
BILIRUBIN TOTAL: 0.3 mg/dL (ref 0.3–1.2)
BUN: 12 mg/dL (ref 6–23)
CHLORIDE: 100 meq/L (ref 96–112)
CO2: 27 mEq/L (ref 19–32)
Calcium: 10.3 mg/dL (ref 8.4–10.5)
Creatinine, Ser: 0.68 mg/dL (ref 0.50–1.10)
GFR calc non Af Amer: >90 mL/min (ref >90)
GLUCOSE: 248 mg/dL — AB (ref 70–99)
Potassium: 3.9 mEq/L (ref 3.7–5.3)
Sodium: 141 mEq/L (ref 137–147)
TOTAL PROTEIN: 7.2 g/dL (ref 6.0–8.3)

## 2013-10-14 LAB — URINALYSIS, ROUTINE W REFLEX MICROSCOPIC
Bilirubin Urine: NEGATIVE
GLUCOSE, UA: 500 mg/dL — AB
HGB URINE DIPSTICK: NEGATIVE
KETONES UR: NEGATIVE mg/dL
LEUKOCYTES UA: NEGATIVE
Nitrite: NEGATIVE
PH: 5.5 (ref 5.0–8.0)
Protein, ur: NEGATIVE mg/dL
Specific Gravity, Urine: 1.025 (ref 1.005–1.030)
Urobilinogen, UA: 0.2 mg/dL (ref 0.0–1.0)

## 2013-10-14 LAB — CBG MONITORING, ED: Glucose-Capillary: 296 mg/dL — ABNORMAL HIGH (ref 70–99)

## 2013-10-14 MED ORDER — TAMSULOSIN HCL 0.4 MG PO CAPS: 0.4000 mg | ORAL_CAPSULE | Freq: Every day | ORAL | Status: DC

## 2013-10-14 MED ORDER — HYDROMORPHONE HCL PF 1 MG/ML IJ SOLN
1.0000 mg | Freq: Once | INTRAMUSCULAR | Status: AC
  Administered 2013-10-14: 1 mg via INTRAMUSCULAR
  Filled 2013-10-14: qty 1

## 2013-10-14 MED ORDER — HYDROMORPHONE HCL PF 2 MG/ML IJ SOLN
2.0000 mg | Freq: Once | INTRAMUSCULAR | Status: AC
  Administered 2013-10-14: 2 mg via INTRAMUSCULAR
  Filled 2013-10-14: qty 1

## 2013-10-14 MED ORDER — ONDANSETRON 4 MG PO TBDP: ORAL_TABLET | ORAL | Status: DC

## 2013-10-14 MED ORDER — OXYCODONE-ACETAMINOPHEN 5-325 MG PO TABS
1.0000 | ORAL_TABLET | Freq: Four times a day (QID) | ORAL | Status: DC | PRN
Start: 2013-10-14 — End: 2015-06-25

## 2013-10-14 NOTE — ED Provider Notes (Signed)
CSN: [id number]     Arrival date & time 10/14/13  1634 History  This chart was scribed for Valerie LennertJoseph L Latrecia Capito, MD by Beverly MilchJ Harrison Collins, ED Scribe. This patient was seen in room APA19/APA19 and the patient's care was started at 9:19 PM.    Chief Complaint  Patient presents with  . Flank Pain    Patient is a 63 y.o. female presenting with flank pain. The history is provided by the patient. No language interpreter was used.  Flank Pain This is a new problem. The current episode started more than 2 days ago. The problem occurs constantly. Pertinent negatives include no chest pain, no abdominal pain and no headaches. The symptoms are aggravated by bending, twisting, exertion and walking. The symptoms are relieved by lying down and rest. She has tried nothing for the symptoms. The treatment provided no relief.  Pt reports experiencing mild burning on urination and decreased urine output.   Past Medical History  Diagnosis Date  . Chest pain     Reassuring workup of her time including negative cardiac catheterization and stress testing  . Type 2 diabetes mellitus   . Mixed hyperlipidemia   . Obesity   . Essential hypertension, benign   . COPD (chronic obstructive pulmonary disease)   . DJD (degenerative joint disease)   . Urinary incontinence   . GERD (gastroesophageal reflux disease)   . IBS (irritable bowel syndrome)   . Fibromyalgia   . Asthma   . Neuropathy     Past Surgical History  Procedure Laterality Date  . Tonsillectomy    . Hysterectomy -- unknown    . Cholecystectomy    . Carpal tunnel release      Right  . Left thumb surgery    . Cesarean section      Family History  Problem Relation Age of Onset  . Coronary artery disease Father   . Heart attack Father     Died age 61    History  Substance Use Topics  . Smoking status: Former Smoker -- 1.00 packs/day for 2 years    Types: Cigarettes    Quit date: 06/20/1982  . Smokeless tobacco: Never Used  . Alcohol Use:  No    OB History   Grav Para Term Preterm Abortions TAB SAB Ect Mult Living                  Review of Systems  Constitutional: Negative for appetite change and fatigue.  HENT: Negative for congestion, ear discharge and sinus pressure.   Eyes: Negative for discharge.  Respiratory: Negative for cough.   Cardiovascular: Negative for chest pain.  Gastrointestinal: Negative for abdominal pain and diarrhea.  Genitourinary: Positive for dysuria, flank pain and decreased urine volume. Negative for frequency and hematuria.  Musculoskeletal: Negative for back pain.  Skin: Negative for rash.  Neurological: Negative for seizures and headaches.  Psychiatric/Behavioral: Negative for hallucinations.      Allergies  Nsaids; Bee venom; Chocolate; Darvon; Ivp dye; and Penicillins  Home Medications   Prior to Admission medications   Medication Sig Start Date End Date Taking? Authorizing Provider  albuterol (VENTOLIN HFA) 108 (90 BASE) MCG/ACT inhaler Inhale 1 puff into the lungs every 6 (six) hours as needed for wheezing or shortness of breath.    Historical Provider, MD  ALPRAZolam Prudy Feeler(XANAX) 0.5 MG tablet Take 0.5 mg by mouth 3 (three) times daily as needed for anxiety or sleep.     Historical Provider, MD  atorvastatin (LIPITOR)  80 MG tablet Take 80 mg by mouth daily.    Historical Provider, MD  benazepril-hydrochlorthiazide (LOTENSIN HCT) 20-12.5 MG per tablet Take 1 tablet by mouth daily.    Historical Provider, MD  cyclobenzaprine (FLEXERIL) 10 MG tablet Take 10 mg by mouth at bedtime as needed for muscle spasms.    Historical Provider, MD  EPINEPHrine (EPIPEN JR) 0.15 MG/0.3ML injection Inject 0.15 mg into the muscle as needed for anaphylaxis.    Historical Provider, MD  Fluticasone-Salmeterol (ADVAIR) 500-50 MCG/DOSE AEPB Inhale 1 puff into the lungs every 12 (twelve) hours.    Historical Provider, MD  glipiZIDE (GLUCOTROL XL) 10 MG 24 hr tablet Take 10 mg by mouth daily with breakfast.     Historical Provider, MD  HYDROcodone-acetaminophen (NORCO/VICODIN) 5-325 MG per tablet Take 1 tablet by mouth 4 times daily as needed for moderate pain or severe pain.  11/28/11   Historical Provider, MD  insulin glargine (LANTUS) 100 UNIT/ML injection Inject 30 Units into the skin at bedtime.     Historical Provider, MD  ipratropium (ATROVENT HFA) 17 MCG/ACT inhaler Inhale 1 puff into the lungs 4 (four) times daily as needed.    Historical Provider, MD  ipratropium-albuterol (DUONEB) 0.5-2.5 (3) MG/3ML SOLN Take 3 mLs by nebulization every 6 (six) hours as needed (for shortness of breath).     Historical Provider, MD  levofloxacin (LEVAQUIN) 500 MG tablet Take 1 tablet (500 mg total) by mouth daily. 09/24/13   Valerie Lennert, MD  nitroGLYCERIN (NITROSTAT) 0.4 MG SL tablet Place 0.4 mg under the tongue every 5 (five) minutes as needed.    Historical Provider, MD  predniSONE (DELTASONE) 20 MG tablet 2 tabs po daily x 3 days 09/24/13   Valerie Lennert, MD  pregabalin (LYRICA) 50 MG capsule Take 50 mg by mouth 2 (two) times daily.    Historical Provider, MD  tiotropium (SPIRIVA) 18 MCG inhalation capsule Place 18 mcg into inhaler and inhale daily.    Historical Provider, MD  traMADol (ULTRAM) 50 MG tablet Take 50 mg by mouth every 8 (eight) hours as needed for moderate pain.     Historical Provider, MD   Triage Vitals: BP 155/85  Pulse 91  Temp(Src) 97.7 F (36.5 C) (Oral)  Resp 22  Ht 5\' 3"  (1.6 m)  Wt 283 lb (128.368 kg)  BMI 50.14 kg/m2  SpO2 100%  Physical Exam  Nursing note and vitals reviewed. Constitutional: She is oriented to person, place, and time. She appears well-developed.  HENT:  Head: Normocephalic.  Eyes: Conjunctivae and EOM are normal. No scleral icterus.  Neck: Neck supple. No thyromegaly present.  Cardiovascular: Normal rate and regular rhythm.  Exam reveals no gallop and no friction rub.   No murmur heard. Pulmonary/Chest: No stridor. She has no wheezes. She has no rales.  She exhibits no tenderness.  Abdominal: She exhibits no distension. There is tenderness (Moderate left flank tenderness). There is no rebound.  Musculoskeletal: Normal range of motion. She exhibits no edema.  Lymphadenopathy:    She has no cervical adenopathy.  Neurological: She is oriented to person, place, and time. She exhibits normal muscle tone. Coordination normal.  Skin: No rash noted. No erythema.  Psychiatric: She has a normal mood and affect. Her behavior is normal.    ED Course  Procedures (including critical care time)  DIAGNOSTIC STUDIES: Oxygen Saturation is 100% on RA, normal by my interpretation.    COORDINATION OF CARE: 9:23 PM- Pt advised of plan for treatment  and pt agrees.   Labs Review Labs Reviewed  URINALYSIS, ROUTINE W REFLEX MICROSCOPIC - Abnormal; Notable for the following:    APPearance CLOUDY (*)    Glucose, UA 500 (*)    All other components within normal limits  CBG MONITORING, ED - Abnormal; Notable for the following:    Glucose-Capillary 296 (*)    All other components within normal limits  CBG MONITORING, ED    Imaging Review No results found.   EKG Interpretation None      MDM   Final diagnoses:  None   Kidney stone The chart was scribed for me under my direct supervision.  I personally performed the history, physical, and medical decision making and all procedures in the evaluation of this patient.Valerie Wagner.   Harbour Nordmeyer L Wilbert Schouten, MD 10/14/13 [phone]

## 2013-10-14 NOTE — ED Notes (Signed)
Pt c/o left flank pain and dysuria x 3 days.

## 2013-10-17 ENCOUNTER — Encounter: Payer: Medicaid Other | Admitting: Cardiology

## 2013-10-17 NOTE — Progress Notes (Signed)
Patient canceled.  This encounter was created in error - please disregard. 

## 2013-11-19 ENCOUNTER — Encounter: Payer: Self-pay | Admitting: Cardiology

## 2013-11-19 ENCOUNTER — Encounter: Payer: Medicaid Other | Admitting: Cardiology

## 2013-11-19 NOTE — Progress Notes (Signed)
No show  This encounter was created in error - please disregard.

## 2014-04-06 ENCOUNTER — Emergency Department (HOSPITAL_COMMUNITY)
Admission: EM | Admit: 2014-04-06 | Discharge: 2014-04-07 | Disposition: A | Discharge status: Home / Self Care | Payer: Medicaid Other | Attending: Emergency Medicine | Admitting: Emergency Medicine

## 2014-04-06 ENCOUNTER — Encounter (HOSPITAL_COMMUNITY): Payer: Self-pay | Admitting: Emergency Medicine

## 2014-04-06 ENCOUNTER — Emergency Department (HOSPITAL_COMMUNITY): Payer: Medicaid Other

## 2014-04-06 DIAGNOSIS — R51 Headache: Secondary | ICD-10-CM | POA: Diagnosis not present

## 2014-04-06 DIAGNOSIS — E782 Mixed hyperlipidemia: Secondary | ICD-10-CM | POA: Insufficient documentation

## 2014-04-06 DIAGNOSIS — Z87891 Personal history of nicotine dependence: Secondary | ICD-10-CM | POA: Diagnosis not present

## 2014-04-06 DIAGNOSIS — Z794 Long term (current) use of insulin: Secondary | ICD-10-CM | POA: Insufficient documentation

## 2014-04-06 DIAGNOSIS — E119 Type 2 diabetes mellitus without complications: Secondary | ICD-10-CM | POA: Diagnosis not present

## 2014-04-06 DIAGNOSIS — Z880 Allergy status to penicillin: Secondary | ICD-10-CM | POA: Diagnosis not present

## 2014-04-06 DIAGNOSIS — R2242 Localized swelling, mass and lump, left lower limb: Secondary | ICD-10-CM

## 2014-04-06 DIAGNOSIS — I10 Essential (primary) hypertension: Secondary | ICD-10-CM | POA: Diagnosis not present

## 2014-04-06 DIAGNOSIS — M549 Dorsalgia, unspecified: Secondary | ICD-10-CM | POA: Diagnosis not present

## 2014-04-06 DIAGNOSIS — R1032 Left lower quadrant pain: Secondary | ICD-10-CM | POA: Insufficient documentation

## 2014-04-06 DIAGNOSIS — E669 Obesity, unspecified: Secondary | ICD-10-CM | POA: Diagnosis not present

## 2014-04-06 DIAGNOSIS — R110 Nausea: Secondary | ICD-10-CM | POA: Insufficient documentation

## 2014-04-06 DIAGNOSIS — Z87442 Personal history of urinary calculi: Secondary | ICD-10-CM | POA: Diagnosis not present

## 2014-04-06 DIAGNOSIS — R109 Unspecified abdominal pain: Secondary | ICD-10-CM

## 2014-04-06 DIAGNOSIS — J441 Chronic obstructive pulmonary disease with (acute) exacerbation: Secondary | ICD-10-CM | POA: Insufficient documentation

## 2014-04-06 DIAGNOSIS — Z8719 Personal history of other diseases of the digestive system: Secondary | ICD-10-CM | POA: Diagnosis not present

## 2014-04-06 DIAGNOSIS — Z7951 Long term (current) use of inhaled steroids: Secondary | ICD-10-CM | POA: Insufficient documentation

## 2014-04-06 DIAGNOSIS — Z79899 Other long term (current) drug therapy: Secondary | ICD-10-CM | POA: Diagnosis not present

## 2014-04-06 DIAGNOSIS — G629 Polyneuropathy, unspecified: Secondary | ICD-10-CM | POA: Insufficient documentation

## 2014-04-06 LAB — URINALYSIS, ROUTINE W REFLEX MICROSCOPIC
Bilirubin Urine: NEGATIVE
Glucose, UA: NEGATIVE mg/dL
Hgb urine dipstick: NEGATIVE
KETONES UR: NEGATIVE mg/dL
LEUKOCYTES UA: NEGATIVE
NITRITE: NEGATIVE
Protein, ur: NEGATIVE mg/dL
Specific Gravity, Urine: >1.03 — ABNORMAL HIGH (ref 1.005–1.030)
UROBILINOGEN UA: 0.2 mg/dL (ref 0.0–1.0)
pH: 5.5 (ref 5.0–8.0)

## 2014-04-06 LAB — BASIC METABOLIC PANEL
ANION GAP: 12 (ref 5–15)
BUN: 19 mg/dL (ref 6–23)
CO2: 28 mEq/L (ref 19–32)
Calcium: 10.6 mg/dL — ABNORMAL HIGH (ref 8.4–10.5)
Chloride: 99 mEq/L (ref 96–112)
Creatinine, Ser: 0.77 mg/dL (ref 0.50–1.10)
GFR calc non Af Amer: 89 mL/min — ABNORMAL LOW (ref >90)
Glucose, Bld: 191 mg/dL — ABNORMAL HIGH (ref 70–99)
Potassium: 4 mEq/L (ref 3.7–5.3)
Sodium: 139 mEq/L (ref 137–147)

## 2014-04-06 LAB — CBC WITH DIFFERENTIAL/PLATELET
BASOS PCT: 0 % (ref 0–1)
Basophils Absolute: 0.1 10*3/uL (ref 0.0–0.1)
EOS ABS: 0.7 10*3/uL (ref 0.0–0.7)
Eosinophils Relative: 5 % (ref 0–5)
HCT: 43.2 % (ref 36.0–46.0)
Hemoglobin: 14.5 g/dL (ref 12.0–15.0)
Lymphocytes Relative: 30 % (ref 12–46)
Lymphs Abs: 4.1 10*3/uL — ABNORMAL HIGH (ref 0.7–4.0)
MCH: 30.7 pg (ref 26.0–34.0)
MCHC: 33.6 g/dL (ref 30.0–36.0)
MCV: 91.3 fL (ref 78.0–100.0)
MONO ABS: 0.6 10*3/uL (ref 0.1–1.0)
Monocytes Relative: 4 % (ref 3–12)
NEUTROS ABS: 8.2 10*3/uL — AB (ref 1.7–7.7)
Neutrophils Relative %: 61 % (ref 43–77)
Platelets: 345 10*3/uL (ref 150–400)
RBC: 4.73 MIL/uL (ref 3.87–5.11)
RDW: 12.3 % (ref 11.5–15.5)
WBC: 13.6 10*3/uL — ABNORMAL HIGH (ref 4.0–10.5)

## 2014-04-06 MED ORDER — SODIUM CHLORIDE 0.9 % IV BOLUS (SEPSIS)
500.0000 mL | Freq: Once | INTRAVENOUS | Status: AC
  Administered 2014-04-06: 500 mL via INTRAVENOUS

## 2014-04-06 MED ORDER — HYDROMORPHONE HCL 1 MG/ML IJ SOLN
1.0000 mg | Freq: Once | INTRAMUSCULAR | Status: AC
  Administered 2014-04-06: 1 mg via INTRAVENOUS
  Filled 2014-04-06: qty 1

## 2014-04-06 NOTE — ED Provider Notes (Signed)
CSN: [id number]     Arrival date & time 04/06/14  2121 History  This chart was scribed for Enid SkeensJoshua M Alta Shober, MD by Richarda Overlieichard Holland, ED Scribe. This patient was seen in room APA11/APA11 and the patient's care was started 10:40 PM.    Chief Complaint  Patient presents with  . Flank Pain    The history is provided by the patient. No language interpreter was used.   HPI Comments: Valerie Wagner is a 63 y.o. female with a history of kidney starts presents to the Emergency Department complaining of gradually worsening, constant left sided flank pain that radiates to her abdomen and groin for the past month. She reports she was admitted for the same symptoms at the beginning of September for 5 days. She states that she was diagnosed with mass on kidney. She reports associated nausea and headaches. She reports a PSHx of hysterectomy, cholecystectomy, and appendectomy. Patient says her current symptoms do not feel like her symptoms from kidney stones in the past. She denies cough and SOB as associated symptoms.   Past Medical History  Diagnosis Date  . Chest pain     Reassuring workup of her time including negative cardiac catheterization and stress testing  . Type 2 diabetes mellitus   . Mixed hyperlipidemia   . Obesity   . Essential hypertension, benign   . COPD (chronic obstructive pulmonary disease)   . DJD (degenerative joint disease)   . Urinary incontinence   . GERD (gastroesophageal reflux disease)   . IBS (irritable bowel syndrome)   . Fibromyalgia   . Asthma   . Neuropathy    Past Surgical History  Procedure Laterality Date  . Tonsillectomy    . Hysterectomy -- unknown    . Cholecystectomy    . Carpal tunnel release      Right  . Left thumb surgery    . Cesarean section     Family History  Problem Relation Age of Onset  . Coronary artery disease Father   . Heart attack Father     Died age 61   History  Substance Use Topics  . Smoking status: Former Smoker -- 1.00  packs/day for 2 years    Types: Cigarettes    Quit date: 06/20/1982  . Smokeless tobacco: Never Used  . Alcohol Use: No   OB History   Grav Para Term Preterm Abortions TAB SAB Ect Mult Living                 Review of Systems  Respiratory: Negative for cough and shortness of breath.   Gastrointestinal: Positive for nausea and abdominal pain. Negative for vomiting.  Genitourinary: Positive for flank pain.  Musculoskeletal: Positive for back pain.  Neurological: Positive for headaches.  All other systems reviewed and are negative.     Allergies  Nsaids; Bee venom; Chocolate; Darvon; Ivp dye; and Penicillins  Home Medications   Prior to Admission medications   Medication Sig Start Date End Date Taking? Authorizing Provider  albuterol (VENTOLIN HFA) 108 (90 BASE) MCG/ACT inhaler Inhale 1 puff into the lungs every 6 (six) hours as needed for wheezing or shortness of breath.   Yes Historical Provider, MD  ALPRAZolam Prudy Feeler(XANAX) 0.5 MG tablet Take 0.5 mg by mouth 3 (three) times daily as needed for anxiety or sleep.    Yes Historical Provider, MD  atorvastatin (LIPITOR) 80 MG tablet Take 80 mg by mouth daily.   Yes Historical Provider, MD  benazepril-hydrochlorthiazide (LOTENSIN HCT) 20-12.5 MG  per tablet Take 1 tablet by mouth daily.   Yes Historical Provider, MD  cyclobenzaprine (FLEXERIL) 10 MG tablet Take 10 mg by mouth at bedtime as needed for muscle spasms.   Yes Historical Provider, MD  DULoxetine (CYMBALTA) 60 MG capsule Take 60 mg by mouth daily.   Yes Historical Provider, MD  EPINEPHrine (EPIPEN JR) 0.15 MG/0.3ML injection Inject 0.15 mg into the muscle as needed for anaphylaxis.   Yes Historical Provider, MD  Fluticasone-Salmeterol (ADVAIR) 500-50 MCG/DOSE AEPB Inhale 1 puff into the lungs every 12 (twelve) hours.   Yes Historical Provider, MD  glipiZIDE (GLUCOTROL XL) 10 MG 24 hr tablet Take 10 mg by mouth daily with breakfast.   Yes Historical Provider, MD   HYDROcodone-acetaminophen (NORCO) 7.5-325 MG per tablet Take 1 tablet by mouth every 6 (six) hours as needed for moderate pain.   Yes Historical Provider, MD  insulin glargine (LANTUS) 100 UNIT/ML injection Inject 42 Units into the skin at bedtime.    Yes Historical Provider, MD  ipratropium (ATROVENT HFA) 17 MCG/ACT inhaler Inhale 1 puff into the lungs 4 (four) times daily as needed.   Yes Historical Provider, MD  ipratropium-albuterol (DUONEB) 0.5-2.5 (3) MG/3ML SOLN Take 3 mLs by nebulization every 6 (six) hours as needed (for shortness of breath).    Yes Historical Provider, MD  nitroGLYCERIN (NITROSTAT) 0.4 MG SL tablet Place 0.4 mg under the tongue every 5 (five) minutes as needed.   Yes Historical Provider, MD  potassium chloride SA (KLOR-CON M15) 15 MEQ tablet Take 15 mEq by mouth 2 (two) times daily.   Yes Historical Provider, MD  pregabalin (LYRICA) 50 MG capsule Take 50 mg by mouth 2 (two) times daily.   Yes Historical Provider, MD  tiotropium (SPIRIVA) 18 MCG inhalation capsule Place 18 mcg into inhaler and inhale daily.   Yes Historical Provider, MD  traMADol (ULTRAM) 50 MG tablet Take 50 mg by mouth every 8 (eight) hours as needed for moderate pain.    Yes Historical Provider, MD  HYDROcodone-acetaminophen (NORCO) 5-325 MG per tablet Take 2 tablets by mouth every 4 (four) hours as needed. 04/07/14   Enid SkeensJoshua M Icyss Skog, MD  ondansetron (ZOFRAN ODT) 4 MG disintegrating tablet 4mg  ODT q4 hours prn nausea/vomit 10/14/13   Benny LennertJoseph L Zammit, MD  ondansetron (ZOFRAN ODT) 4 MG disintegrating tablet 4mg  ODT q4 hours prn nausea/vomit 04/07/14   Enid SkeensJoshua M Orvell Careaga, MD  oxyCODONE-acetaminophen (PERCOCET/ROXICET) 5-325 MG per tablet Take 1 tablet by mouth every 6 (six) hours as needed for severe pain. 10/14/13   Benny LennertJoseph L Zammit, MD  tamsulosin (FLOMAX) 0.4 MG CAPS capsule Take 1 capsule (0.4 mg total) by mouth daily. 10/14/13   Benny LennertJoseph L Zammit, MD   BP 131/63  Pulse 76  Temp(Src) 97.9 F (36.6 C) (Oral)   Resp 16  Ht 5\' 3"  (1.6 m)  Wt 264 lb (119.75 kg)  BMI 46.78 kg/m2  SpO2 96% Physical Exam  Nursing note and vitals reviewed. Constitutional: She is oriented to person, place, and time. She appears well-developed and well-nourished.  HENT:  Head: Normocephalic and atraumatic.  Eyes: Pupils are equal, round, and reactive to light. No scleral icterus.  Neck: Neck supple.  Cardiovascular: Normal rate, regular rhythm and normal heart sounds.   Pulmonary/Chest: Effort normal. No respiratory distress. She has wheezes.  Slight expiratory wheeze  Abdominal: Soft. There is no guarding.  No focal tenderness    Musculoskeletal: Normal range of motion. She exhibits tenderness. She exhibits no edema.  Tenderness  in left lower flank   Patient has mild tenderness anterior left mid thigh, no significant swelling, no erythema warmth or sign of infection.   Neurological: She is alert and oriented to person, place, and time.  Skin: Skin is warm and dry.  Psychiatric: She has a normal mood and affect.    ED Course  Procedures  DIAGNOSTIC STUDIES: Oxygen Saturation is 96% on RA, normal by my interpretation.    COORDINATION OF CARE: 10:45 PM Discussed treatment plan with pt at bedside and pt agreed to plan.   Labs Review Labs Reviewed  URINALYSIS, ROUTINE W REFLEX MICROSCOPIC - Abnormal; Notable for the following:    APPearance HAZY (*)    Specific Gravity, Urine >1.030 (*)    All other components within normal limits  BASIC METABOLIC PANEL - Abnormal; Notable for the following:    Glucose, Bld 191 (*)    Calcium 10.6 (*)    GFR calc non Af Amer 89 (*)    All other components within normal limits  CBC WITH DIFFERENTIAL - Abnormal; Notable for the following:    WBC 13.6 (*)    Neutro Abs 8.2 (*)    Lymphs Abs 4.1 (*)    All other components within normal limits    Imaging Review Ct Renal Stone Study  04/07/2014   CLINICAL DATA:  Gradually worsening constant left-sided flank pain  radiating to the abdomen and groin for a month. Nausea and headaches. Prior history of renal stones. Patient passed 10 stones in August 2015.  EXAM: CT ABDOMEN AND PELVIS WITHOUT CONTRAST  TECHNIQUE: Multidetector CT imaging of the abdomen and pelvis was performed following the standard protocol without IV contrast.  COMPARISON:  Ultrasound kidneys 03/16/2014. CT abdomen and pelvis 03/15/2014.  FINDINGS: Lung bases are clear.  No renal, ureteral, or bladder stones are identified. No hydronephrosis or hydroureter. Prominent intrarenal collecting system versus parapelvic cysts on the left are unchanged. Small exophytic lesion off of the right kidney is unchanged. This was terminal ultrasound represent a small cyst. Bladder wall is not thickened.  Surgical absence of the gallbladder. The unenhanced appearance of the liver, spleen, pancreas, adrenal glands, abdominal aorta, inferior vena cava, and retroperitoneal lymph nodes is unremarkable. Small accessory spleens. Stomach, small bowel, and colon are not abnormally distended. No free air or free fluid in the abdomen. Small umbilical hernia containing fat.  Pelvis: Tiny gas bubble demonstrated in the bladder could be result of instrumentation or bladder infection. No bladder wall thickening. No free or loculated pelvic fluid collections. No pelvic mass or lymphadenopathy. Appendix is not identified but no inflammatory changes are seen in the right lower quadrant. No evidence of diverticulitis. Cystic collection again demonstrated in the left upper thigh musculature anterior to the proximal femoral shaft. This could represent bursal fluid collection or mass lesion. No change since prior study. Degenerative changes in the lumbar spine. No destructive bone lesions appreciated.  IMPRESSION: No evidence of renal or ureteral stone or obstruction. Tiny gas bubble demonstrated in the bladder could indicate infection or prior instrumentation. Unchanged appearance of the kidneys  since previous study. Again demonstrated is a mass or fluid collection in the left upper thigh musculature anteriorly.   Electronically Signed   By: Burman Nieves M.D.   On: 04/07/2014 00:03     EKG Interpretation None      MDM   Final diagnoses:  Acute left flank pain  Mass of left thigh   I personally performed the services described in  this documentation, which was scribed in my presence. The recorded information has been reviewed and is accurate.  Patient with recurrent left flank pain for months, patient feels this feels different than her kidney stone history. Patient was evaluated at an outside small hospital and was told she had "kidney mass. With worsening pain discussed repeating CT scan to look for any acute findings. Discussed outpatient followup with urology.  Pain medicines given in ER. Discuss CT results with the patient. Stressed very close followup outpatient and with primary Dr. in orthopedics for further workup of left thigh mass. No sign of infection in ER.   Filed Vitals:   04/06/14 2137  BP: 131/63  Pulse: 76  Temp: 97.9 F (36.6 C)  Resp: 16       Enid Skeens, MD 04/07/14 [phone]

## 2014-04-06 NOTE — ED Notes (Signed)
Patient reports left flank pain for over a month. States recently admitted for same and diagnosed with mass on kidney. States pain is increasing and she has noticed blood in urine.

## 2014-04-07 LAB — CBG MONITORING, ED: Glucose-Capillary: 160 mg/dL — ABNORMAL HIGH (ref 70–99)

## 2014-04-07 MED ORDER — ONDANSETRON HCL 4 MG/2ML IJ SOLN
4.0000 mg | Freq: Once | INTRAMUSCULAR | Status: AC
  Administered 2014-04-07: 4 mg via INTRAVENOUS
  Filled 2014-04-07: qty 2

## 2014-04-07 MED ORDER — ONDANSETRON 4 MG PO TBDP: ORAL_TABLET | ORAL | Status: DC

## 2014-04-07 MED ORDER — HYDROCODONE-ACETAMINOPHEN 5-325 MG PO TABS: 2.0000 | ORAL_TABLET | ORAL | Status: DC | PRN

## 2014-04-07 NOTE — Discharge Instructions (Signed)
If you were given medicines take as directed.  If you are on coumadin or contraceptives realize their levels and effectiveness is altered by many different medicines.  If you have any reaction (rash, tongues swelling, other) to the medicines stop taking and see a physician.   Discuss further work up for your thigh mass with your doctor and orthopaedics this week. Please follow up as directed and return to the ER or see a physician for new or worsening symptoms.  Thank you. Filed Vitals:   04/06/14 2137  BP: 131/63  Pulse: 76  Temp: 97.9 F (36.6 C)  TempSrc: Oral  Resp: 16  Height: 5\' 3"  (1.6 m)  Weight: 264 lb (119.75 kg)  SpO2: 96%

## 2014-12-02 IMAGING — CT CT ABD-PELV W/O CM
2 of 4 series · 16 of 46 positions shown, 18 images · non-contrast
Comparison: None.

CLINICAL DATA: Left flank pain and lower abdominal pain. With
dysuria and oliguria

EXAM:
CT ABDOMEN AND PELVIS WITHOUT CONTRAST
TECHNIQUE: Multidetector CT imaging of the abdomen and pelvis was performed
following the standard protocol without intravenous contrast.

[Series 2: standard/full over (age)lbs 5.0 · axial · 0.98mm/px · z∈[-474,-30]mm · 13 of 97 slices shown, 15 images]
[im 4/97  soft-tissue]
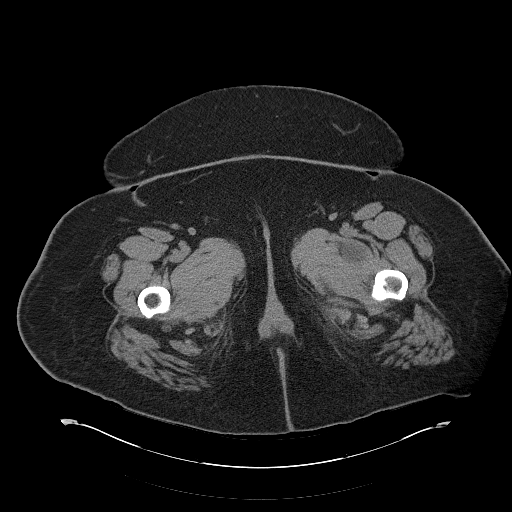
[im 4/97  bone]
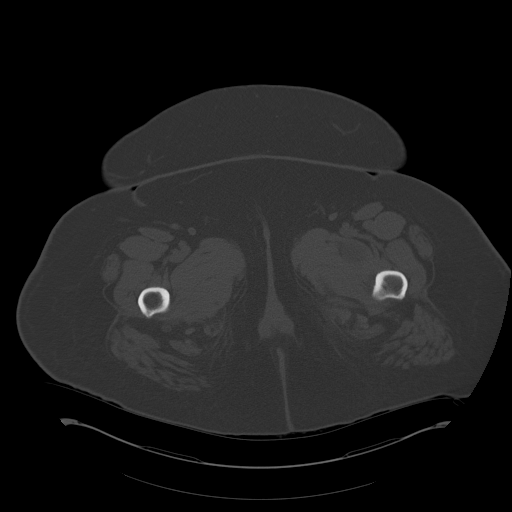
[im 12/97  soft-tissue]
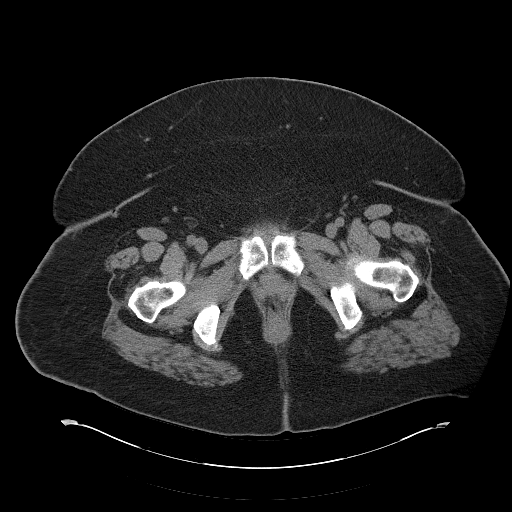
[im 20/97  soft-tissue]
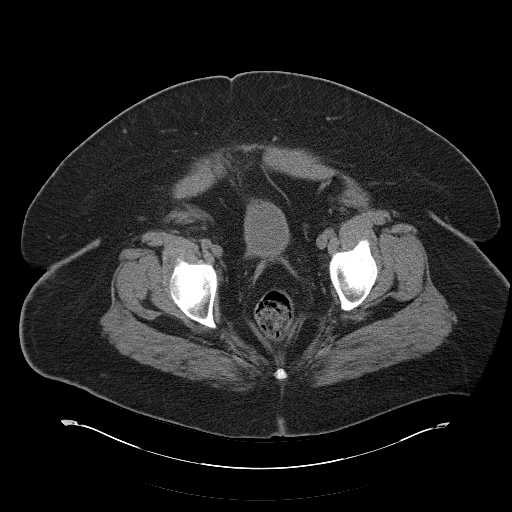
[im 27/97  soft-tissue]
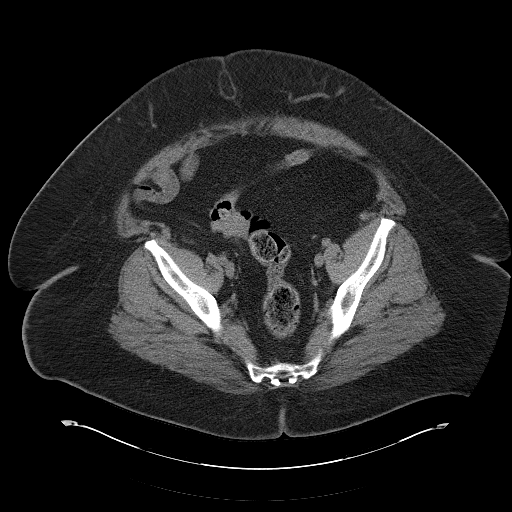
[im 35/97  soft-tissue]
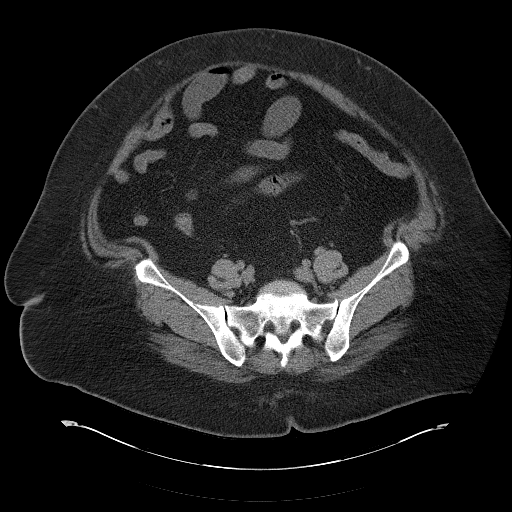
[im 43/97  soft-tissue]
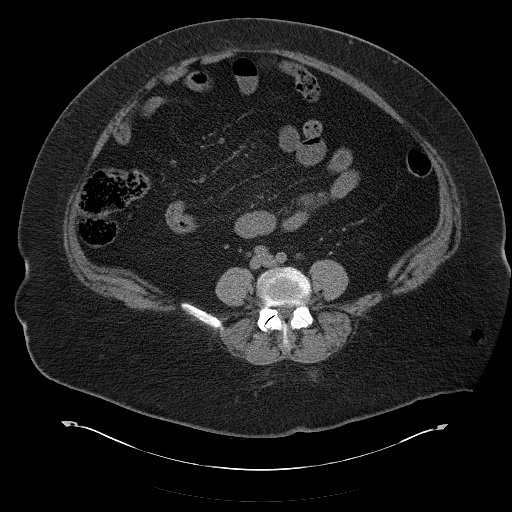
[im 50/97  soft-tissue]
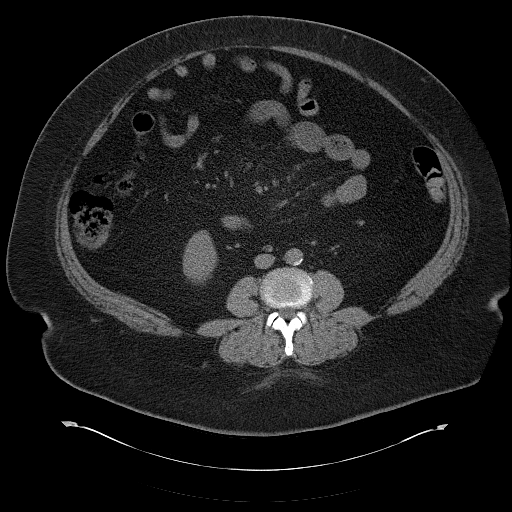
[im 54/97  soft-tissue]
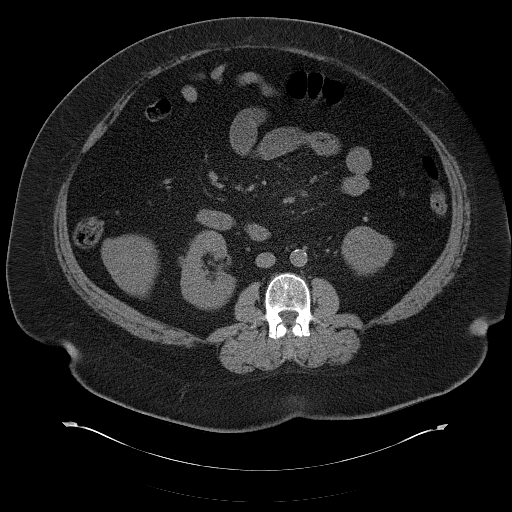
[im 62/97  soft-tissue]
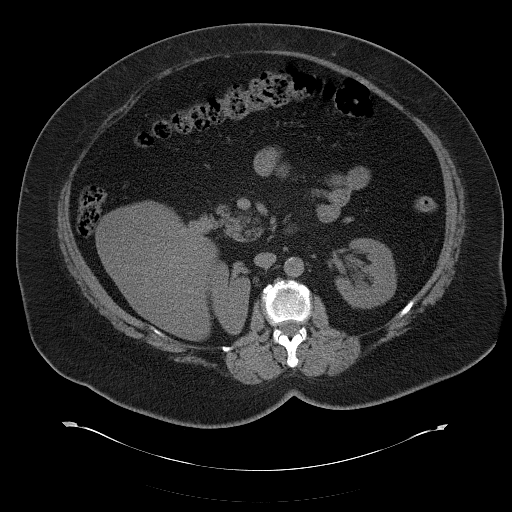
[im 62/97  bone]
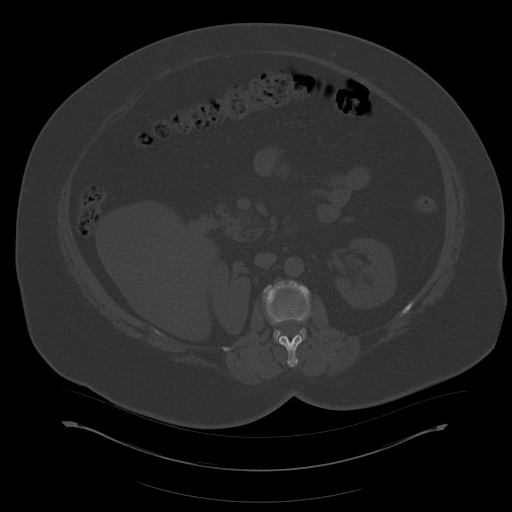
[im 70/97  soft-tissue]
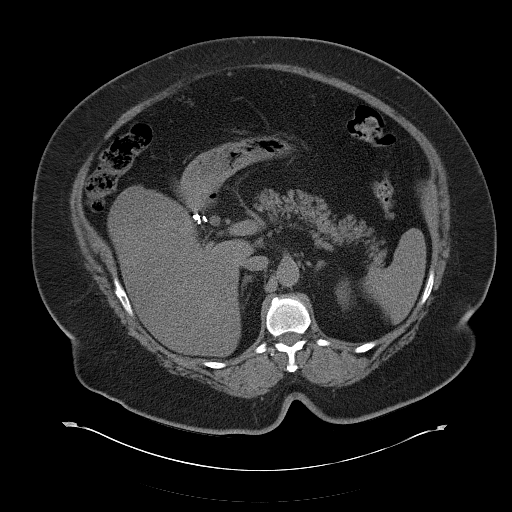
[im 77/97  soft-tissue]
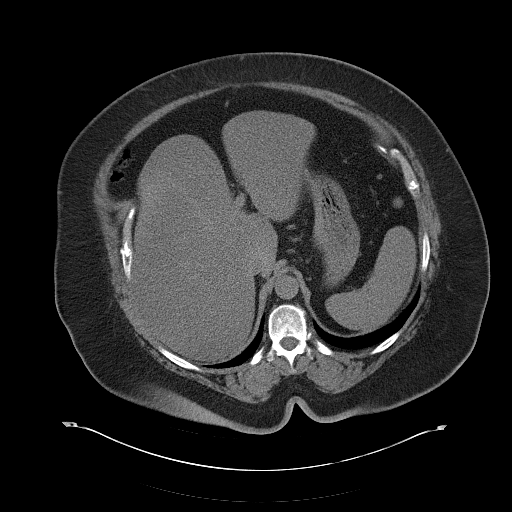
[im 85/97  soft-tissue]
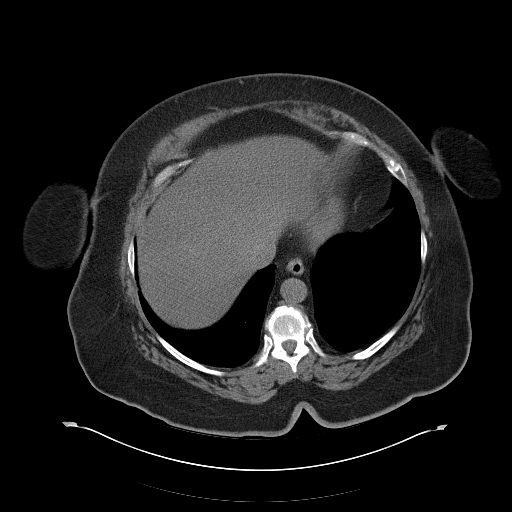
[im 93/97  soft-tissue]
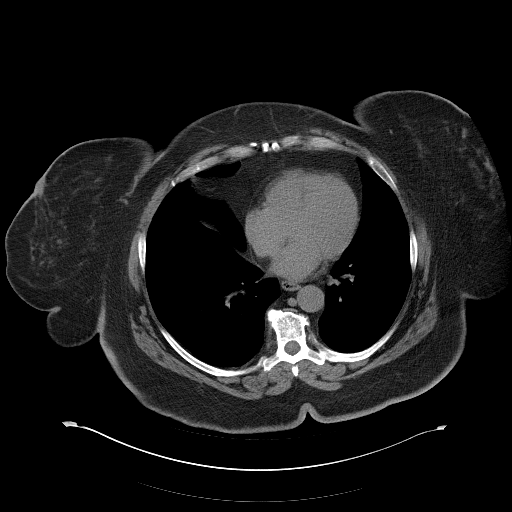

[Series 5: mpr coronal · coronal · 0.95mm/px · 3 of 129 slices shown]
[im 43/129  soft-tissue]
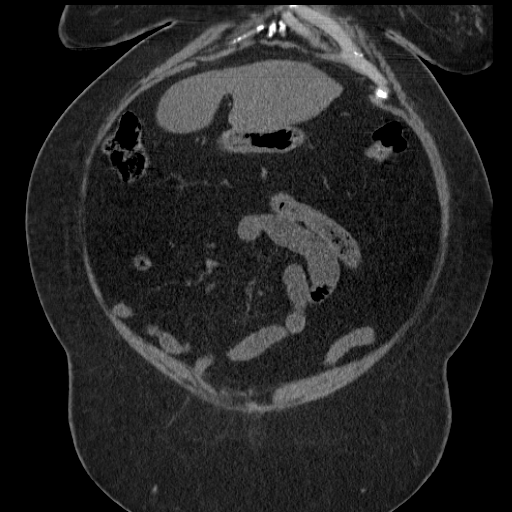
[im 57/129  soft-tissue]
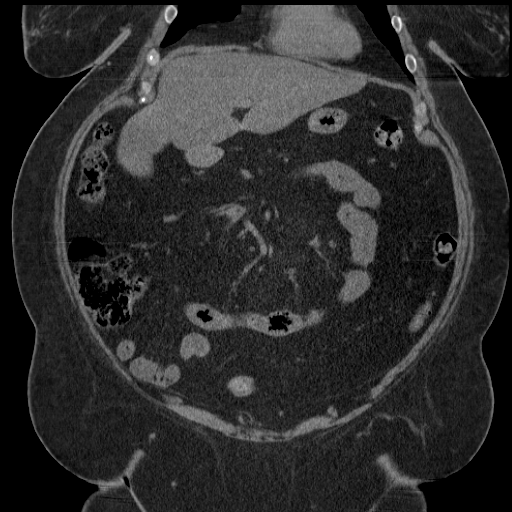
[im 72/129  soft-tissue]
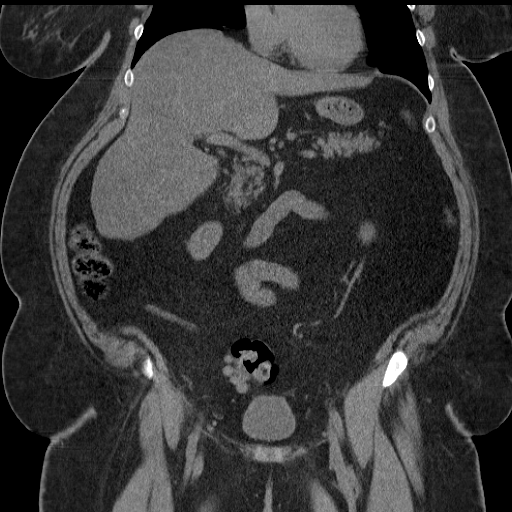

[16 of 46 positions shown; findings below may reference images not displayed]

FINDINGS: The kidneys are normal in contour. There is mild hydronephrosis on
the left which may be secondary to a 1 mm diameter stone at the
ureteropelvic junction. This demonstrated on image 40. The
perinephric fat is normal in density. The right kidney is normal in
contour. There is an exophytic 1 cm diameter hypodensity arising
from the lateral aspect of the mid pole with Hounsfield measurement
of 24. This may reflect a cyst but is nonspecific. Along the course
of the ureters no stones are evident. The partially distended
urinary bladder is normal in appearance. The uterus is surgically
absent. There are no adnexal masses. There is no free pelvic fluid.

The liver, spleen, pancreas, and adrenal glands are normal in
appearance. There is a 1.5 cm diameter accessory spleen on image 22
of series 2. The gallbladder is surgically absent. The caliber of
the abdominal aorta is normal. The periaortic and pericaval regions
are normal.

The stomach, small bowel, and large bowel are normal in appearance
for the noncontrast technique. No pathologic size intra-abdominal or
pelvic lymph nodes are demonstrated.

The lumbar vertebral bodies are preserved in height. The
intervertebral disc space heights are well maintained. The bony
pelvis exhibits no acute abnormality.

The lung bases are clear.  The
IMPRESSION: 1. There is mild left-sided hydronephrosis which may be secondary to
an approximately 1 mm diameter stone at the UPJ demonstrated on
image 40 of series 2. The right kidney exhibits no evidence of
obstruction or stones. An exophytic 1 cm diameter focus arising from
the lateral aspect of the midpole of the right kidney merits further
evaluation. Elective ultrasound or renal protocol MRI is recommended
when the patient has recovered from her acute illness.
2. No acute abnormality elsewhere within the abdomen or pelvis is
demonstrated.

## 2015-06-25 ENCOUNTER — Inpatient Hospital Stay (HOSPITAL_COMMUNITY): Payer: Medicaid Other

## 2015-06-25 ENCOUNTER — Emergency Department (HOSPITAL_COMMUNITY): Payer: Medicaid Other

## 2015-06-25 ENCOUNTER — Inpatient Hospital Stay (HOSPITAL_COMMUNITY)
Admission: EM | Admit: 2015-06-25 | Discharge: 2015-07-07 | DRG: 871 | Disposition: A | Discharge status: Skilled Nursing Facility | Payer: Medicaid Other | Attending: Internal Medicine | Admitting: Internal Medicine

## 2015-06-25 DIAGNOSIS — Z79899 Other long term (current) drug therapy: Secondary | ICD-10-CM

## 2015-06-25 DIAGNOSIS — M797 Fibromyalgia: Secondary | ICD-10-CM | POA: Diagnosis present

## 2015-06-25 DIAGNOSIS — R509 Fever, unspecified: Secondary | ICD-10-CM | POA: Diagnosis present

## 2015-06-25 DIAGNOSIS — R0902 Hypoxemia: Secondary | ICD-10-CM | POA: Diagnosis not present

## 2015-06-25 DIAGNOSIS — E876 Hypokalemia: Secondary | ICD-10-CM | POA: Diagnosis not present

## 2015-06-25 DIAGNOSIS — R400 Somnolence: Secondary | ICD-10-CM

## 2015-06-25 DIAGNOSIS — Z6841 Body Mass Index (BMI) 40.0 and over, adult: Secondary | ICD-10-CM | POA: Diagnosis not present

## 2015-06-25 DIAGNOSIS — K118 Other diseases of salivary glands: Secondary | ICD-10-CM | POA: Diagnosis not present

## 2015-06-25 DIAGNOSIS — J449 Chronic obstructive pulmonary disease, unspecified: Secondary | ICD-10-CM | POA: Diagnosis present

## 2015-06-25 DIAGNOSIS — R269 Unspecified abnormalities of gait and mobility: Secondary | ICD-10-CM | POA: Diagnosis present

## 2015-06-25 DIAGNOSIS — R6520 Severe sepsis without septic shock: Secondary | ICD-10-CM | POA: Diagnosis present

## 2015-06-25 DIAGNOSIS — E875 Hyperkalemia: Secondary | ICD-10-CM | POA: Diagnosis present

## 2015-06-25 DIAGNOSIS — G9340 Encephalopathy, unspecified: Secondary | ICD-10-CM | POA: Diagnosis not present

## 2015-06-25 DIAGNOSIS — R062 Wheezing: Secondary | ICD-10-CM

## 2015-06-25 DIAGNOSIS — E1140 Type 2 diabetes mellitus with diabetic neuropathy, unspecified: Secondary | ICD-10-CM | POA: Diagnosis present

## 2015-06-25 DIAGNOSIS — I10 Essential (primary) hypertension: Secondary | ICD-10-CM | POA: Diagnosis present

## 2015-06-25 DIAGNOSIS — R4020 Unspecified coma: Secondary | ICD-10-CM | POA: Diagnosis present

## 2015-06-25 DIAGNOSIS — Z87891 Personal history of nicotine dependence: Secondary | ICD-10-CM

## 2015-06-25 DIAGNOSIS — Z794 Long term (current) use of insulin: Secondary | ICD-10-CM | POA: Diagnosis not present

## 2015-06-25 DIAGNOSIS — J45909 Unspecified asthma, uncomplicated: Secondary | ICD-10-CM | POA: Diagnosis present

## 2015-06-25 DIAGNOSIS — Z7401 Bed confinement status: Secondary | ICD-10-CM | POA: Diagnosis not present

## 2015-06-25 DIAGNOSIS — R1310 Dysphagia, unspecified: Secondary | ICD-10-CM | POA: Diagnosis not present

## 2015-06-25 DIAGNOSIS — R0600 Dyspnea, unspecified: Secondary | ICD-10-CM

## 2015-06-25 DIAGNOSIS — R0602 Shortness of breath: Secondary | ICD-10-CM

## 2015-06-25 DIAGNOSIS — G629 Polyneuropathy, unspecified: Secondary | ICD-10-CM | POA: Diagnosis not present

## 2015-06-25 DIAGNOSIS — Z7951 Long term (current) use of inhaled steroids: Secondary | ICD-10-CM

## 2015-06-25 DIAGNOSIS — G8929 Other chronic pain: Secondary | ICD-10-CM | POA: Diagnosis present

## 2015-06-25 DIAGNOSIS — A419 Sepsis, unspecified organism: Principal | ICD-10-CM | POA: Diagnosis present

## 2015-06-25 DIAGNOSIS — E872 Acidosis: Secondary | ICD-10-CM | POA: Diagnosis present

## 2015-06-25 DIAGNOSIS — G0490 Encephalitis and encephalomyelitis, unspecified: Secondary | ICD-10-CM

## 2015-06-25 DIAGNOSIS — G92 Toxic encephalopathy: Secondary | ICD-10-CM | POA: Diagnosis present

## 2015-06-25 DIAGNOSIS — E119 Type 2 diabetes mellitus without complications: Secondary | ICD-10-CM

## 2015-06-25 DIAGNOSIS — R4189 Other symptoms and signs involving cognitive functions and awareness: Secondary | ICD-10-CM | POA: Diagnosis present

## 2015-06-25 DIAGNOSIS — Z4659 Encounter for fitting and adjustment of other gastrointestinal appliance and device: Secondary | ICD-10-CM

## 2015-06-25 DIAGNOSIS — E782 Mixed hyperlipidemia: Secondary | ICD-10-CM | POA: Diagnosis present

## 2015-06-25 DIAGNOSIS — R4182 Altered mental status, unspecified: Secondary | ICD-10-CM

## 2015-06-25 DIAGNOSIS — R2990 Unspecified symptoms and signs involving the nervous system: Secondary | ICD-10-CM

## 2015-06-25 DIAGNOSIS — M549 Dorsalgia, unspecified: Secondary | ICD-10-CM | POA: Diagnosis present

## 2015-06-25 DIAGNOSIS — E669 Obesity, unspecified: Secondary | ICD-10-CM | POA: Diagnosis not present

## 2015-06-25 HISTORY — DX: Anxiety disorder, unspecified: F41.9

## 2015-06-25 HISTORY — DX: Depression, unspecified: F32.A

## 2015-06-25 HISTORY — DX: Major depressive disorder, single episode, unspecified: F32.9

## 2015-06-25 HISTORY — DX: Other intervertebral disc degeneration, lumbar region: M51.36

## 2015-06-25 HISTORY — DX: Other intervertebral disc degeneration, lumbar region without mention of lumbar back pain or lower extremity pain: M51.369

## 2015-06-25 HISTORY — DX: Reserved for inherently not codable concepts without codable children: IMO0001

## 2015-06-25 LAB — COMPREHENSIVE METABOLIC PANEL
ALBUMIN: 4.5 g/dL (ref 3.5–5.0)
ALK PHOS: 81 U/L (ref 38–126)
ALT: 36 U/L (ref 14–54)
ANION GAP: 13 (ref 5–15)
AST: 38 U/L (ref 15–41)
BUN: 11 mg/dL (ref 6–20)
CALCIUM: 10 mg/dL (ref 8.9–10.3)
CO2: 25 mmol/L (ref 22–32)
Chloride: 101 mmol/L (ref 101–111)
Creatinine, Ser: 0.85 mg/dL (ref 0.44–1.00)
GFR calc Af Amer: >60 mL/min (ref >60)
GFR calc non Af Amer: >60 mL/min (ref >60)
GLUCOSE: 351 mg/dL — AB (ref 65–99)
Potassium: 3.7 mmol/L (ref 3.5–5.1)
Sodium: 139 mmol/L (ref 135–145)
TOTAL PROTEIN: 8.1 g/dL (ref 6.5–8.1)
Total Bilirubin: 0.9 mg/dL (ref 0.3–1.2)

## 2015-06-25 LAB — CBC WITH DIFFERENTIAL/PLATELET
BASOS ABS: 0.1 10*3/uL (ref 0.0–0.1)
BASOS ABS: 0 10*3/uL (ref 0.0–0.1)
BASOS PCT: 0 %
Basophils Relative: 0 %
EOS ABS: 0 10*3/uL (ref 0.0–0.7)
EOS PCT: 1 %
EOS PCT: 0 %
Eosinophils Absolute: 0.1 10*3/uL (ref 0.0–0.7)
HCT: 45.1 % (ref 36.0–46.0)
HCT: 39.3 % (ref 36.0–46.0)
Hemoglobin: 15.6 g/dL — ABNORMAL HIGH (ref 12.0–15.0)
Hemoglobin: 12.7 g/dL (ref 12.0–15.0)
Lymphocytes Relative: 10 %
Lymphocytes Relative: 7 %
Lymphs Abs: 1.8 10*3/uL (ref 0.7–4.0)
Lymphs Abs: 1.2 10*3/uL (ref 0.7–4.0)
MCH: 31.4 pg (ref 26.0–34.0)
MCH: 29.1 pg (ref 26.0–34.0)
MCHC: 34.6 g/dL (ref 30.0–36.0)
MCHC: 32.3 g/dL (ref 30.0–36.0)
MCV: 90.7 fL (ref 78.0–100.0)
MCV: 90.1 fL (ref 78.0–100.0)
Monocytes Absolute: 0.6 10*3/uL (ref 0.1–1.0)
Monocytes Absolute: 0.6 10*3/uL (ref 0.1–1.0)
Monocytes Relative: 3 %
Monocytes Relative: 4 %
Neutro Abs: 16.7 10*3/uL — ABNORMAL HIGH (ref 1.7–7.7)
Neutro Abs: 14.5 10*3/uL — ABNORMAL HIGH (ref 1.7–7.7)
Neutrophils Relative %: 86 %
Neutrophils Relative %: 89 %
PLATELETS: 319 10*3/uL (ref 150–400)
PLATELETS: 254 10*3/uL (ref 150–400)
RBC: 4.97 MIL/uL (ref 3.87–5.11)
RBC: 4.36 MIL/uL (ref 3.87–5.11)
RDW: 12.1 % (ref 11.5–15.5)
RDW: 12.2 % (ref 11.5–15.5)
WBC: 19.3 10*3/uL — ABNORMAL HIGH (ref 4.0–10.5)
WBC: 16.4 10*3/uL — AB (ref 4.0–10.5)

## 2015-06-25 LAB — RAPID URINE DRUG SCREEN, HOSP PERFORMED
AMPHETAMINES: NOT DETECTED
BARBITURATES: NOT DETECTED
Benzodiazepines: POSITIVE — AB
Cocaine: NOT DETECTED
Opiates: POSITIVE — AB
TETRAHYDROCANNABINOL: NOT DETECTED

## 2015-06-25 LAB — URINALYSIS, ROUTINE W REFLEX MICROSCOPIC
Bilirubin Urine: NEGATIVE
Glucose, UA: >1000 mg/dL — AB
Ketones, ur: 40 mg/dL — AB
Leukocytes, UA: NEGATIVE
Nitrite: NEGATIVE
Protein, ur: 100 mg/dL — AB
Specific Gravity, Urine: 1.025 (ref 1.005–1.030)
pH: 5.5 (ref 5.0–8.0)

## 2015-06-25 LAB — BLOOD GAS, VENOUS
Acid-Base Excess: 4.5 mmol/L — ABNORMAL HIGH (ref 0.0–2.0)
Bicarbonate: 20.8 mEq/L (ref 20.0–24.0)
DRAWN BY: 25788
O2 Saturation: 93 %
PCO2 VEN: 35.4 mmHg — AB (ref 45.0–50.0)
PO2 VEN: 70.9 mmHg — AB (ref 30.0–45.0)
pH, Ven: 7.367 — ABNORMAL HIGH (ref 7.250–7.300)

## 2015-06-25 LAB — PROTIME-INR
INR: 1.24 (ref 0.00–1.49)
Prothrombin Time: 15.8 seconds — ABNORMAL HIGH (ref 11.6–15.2)

## 2015-06-25 LAB — I-STAT CG4 LACTIC ACID, ED
LACTIC ACID, VENOUS: 2.97 mmol/L — AB (ref 0.5–2.0)
Lactic Acid, Venous: 4.24 mmol/L (ref 0.5–2.0)

## 2015-06-25 LAB — URINE MICROSCOPIC-ADD ON
Bacteria, UA: NONE SEEN
Squamous Epithelial / LPF: NONE SEEN
WBC, UA: NONE SEEN WBC/hpf (ref 0–5)

## 2015-06-25 LAB — TROPONIN I: TROPONIN I: 0.03 ng/mL (ref <0.031)

## 2015-06-25 LAB — LACTIC ACID, PLASMA: LACTIC ACID, VENOUS: 3.8 mmol/L — AB (ref 0.5–2.0)

## 2015-06-25 LAB — APTT: APTT: 27 s (ref 24–37)

## 2015-06-25 LAB — MRSA PCR SCREENING: MRSA BY PCR: NEGATIVE

## 2015-06-25 MED ORDER — ACETAMINOPHEN 650 MG RE SUPP
RECTAL | Status: AC
  Administered 2015-06-25: 650 mg via RECTAL
  Filled 2015-06-25: qty 1

## 2015-06-25 MED ORDER — LORAZEPAM 2 MG/ML IJ SOLN
INTRAMUSCULAR | Status: AC
  Administered 2015-06-25: 1 mg
  Filled 2015-06-25: qty 1

## 2015-06-25 MED ORDER — ACETAMINOPHEN 650 MG RE SUPP
RECTAL | Status: AC
  Administered 2015-06-25: 650 mg
  Filled 2015-06-25: qty 1

## 2015-06-25 MED ORDER — LORAZEPAM 2 MG/ML IJ SOLN
INTRAMUSCULAR | Status: AC
  Administered 2015-06-25: 2 mg via INTRAVENOUS
  Filled 2015-06-25: qty 1

## 2015-06-25 MED ORDER — SODIUM CHLORIDE 0.9 % IV SOLN
INTRAVENOUS | Status: DC
  Administered 2015-06-25 – 2015-06-27 (×3): via INTRAVENOUS

## 2015-06-25 MED ORDER — LORAZEPAM 2 MG/ML IJ SOLN
2.0000 mg | Freq: Once | INTRAMUSCULAR | Status: AC
  Administered 2015-06-25: 2 mg via INTRAVENOUS

## 2015-06-25 MED ORDER — MORPHINE SULFATE (PF) 2 MG/ML IV SOLN
1.0000 mg | INTRAVENOUS | Status: DC | PRN
Start: 2015-06-25 — End: 2015-06-26
  Administered 2015-06-25 – 2015-06-26 (×2): 1 mg via INTRAVENOUS
  Filled 2015-06-25 (×3): qty 1

## 2015-06-25 MED ORDER — HYDROCODONE-ACETAMINOPHEN 5-325 MG PO TABS: 1.0000 | ORAL_TABLET | ORAL | Status: DC | PRN

## 2015-06-25 MED ORDER — ACETAMINOPHEN 650 MG RE SUPP
650.0000 mg | Freq: Once | RECTAL | Status: AC
  Administered 2015-06-25: 650 mg via RECTAL

## 2015-06-25 MED ORDER — ONDANSETRON HCL 4 MG/2ML IJ SOLN
4.0000 mg | Freq: Four times a day (QID) | INTRAMUSCULAR | Status: DC | PRN
  Administered 2015-06-26: 4 mg via INTRAVENOUS
  Filled 2015-06-25: qty 2

## 2015-06-25 MED ORDER — INSULIN ASPART 100 UNIT/ML ~~LOC~~ SOLN
0.0000 [IU] | Freq: Three times a day (TID) | SUBCUTANEOUS | Status: DC
  Administered 2015-06-26: 7 [IU] via SUBCUTANEOUS
  Administered 2015-06-26: 11 [IU] via SUBCUTANEOUS

## 2015-06-25 MED ORDER — VANCOMYCIN HCL IN DEXTROSE 1-5 GM/200ML-% IV SOLN: 1000.0000 mg | Freq: Once | INTRAVENOUS | Status: DC

## 2015-06-25 MED ORDER — SODIUM CHLORIDE 0.9 % IJ SOLN
3.0000 mL | Freq: Two times a day (BID) | INTRAMUSCULAR | Status: DC
  Administered 2015-06-27 – 2015-07-07 (×15): 3 mL via INTRAVENOUS

## 2015-06-25 MED ORDER — DEXTROSE 5 % IV SOLN
2.0000 g | Freq: Once | INTRAVENOUS | Status: DC
  Filled 2015-06-25: qty 2

## 2015-06-25 MED ORDER — ACYCLOVIR SODIUM 50 MG/ML IV SOLN
600.0000 mg | Freq: Once | INTRAVENOUS | Status: AC
  Administered 2015-06-25: 600 mg via INTRAVENOUS
  Filled 2015-06-25: qty 12

## 2015-06-25 MED ORDER — VANCOMYCIN HCL IN DEXTROSE 1-5 GM/200ML-% IV SOLN
1000.0000 mg | Freq: Once | INTRAVENOUS | Status: AC
  Administered 2015-06-25: 1000 mg via INTRAVENOUS
  Filled 2015-06-25: qty 200

## 2015-06-25 MED ORDER — ACETAMINOPHEN 650 MG RE SUPP
650.0000 mg | Freq: Four times a day (QID) | RECTAL | Status: DC | PRN
  Administered 2015-06-25 – 2015-06-28 (×5): 650 mg via RECTAL
  Filled 2015-06-25 (×6): qty 1

## 2015-06-25 MED ORDER — LEVOFLOXACIN IN D5W 750 MG/150ML IV SOLN
750.0000 mg | INTRAVENOUS | Status: DC
  Administered 2015-06-26: 750 mg via INTRAVENOUS
  Filled 2015-06-25: qty 150

## 2015-06-25 MED ORDER — VANCOMYCIN HCL IN DEXTROSE 1-5 GM/200ML-% IV SOLN
1000.0000 mg | Freq: Two times a day (BID) | INTRAVENOUS | Status: DC
  Administered 2015-06-26 – 2015-06-29 (×8): 1000 mg via INTRAVENOUS
  Filled 2015-06-25 (×9): qty 200

## 2015-06-25 MED ORDER — SODIUM CHLORIDE 0.9 % IV BOLUS (SEPSIS)
1000.0000 mL | INTRAVENOUS | Status: AC
  Administered 2015-06-25 – 2015-06-26 (×3): 1000 mL via INTRAVENOUS

## 2015-06-25 MED ORDER — SODIUM CHLORIDE 0.9 % IV BOLUS (SEPSIS)
1000.0000 mL | INTRAVENOUS | Status: AC
  Administered 2015-06-25 (×4): 1000 mL via INTRAVENOUS

## 2015-06-25 MED ORDER — SODIUM CHLORIDE 0.9 % IV SOLN
Freq: Once | INTRAVENOUS | Status: AC
  Administered 2015-06-25: 18:00:00 via INTRAVENOUS

## 2015-06-25 MED ORDER — DEXTROSE 5 % IV SOLN
2.0000 g | Freq: Three times a day (TID) | INTRAVENOUS | Status: DC
  Administered 2015-06-25 – 2015-06-26 (×3): 2 g via INTRAVENOUS
  Filled 2015-06-25 (×5): qty 2

## 2015-06-25 MED ORDER — DEXTROSE 5 % IV SOLN
2.0000 g | Freq: Once | INTRAVENOUS | Status: AC
  Administered 2015-06-25: 2 g via INTRAVENOUS
  Filled 2015-06-25: qty 2

## 2015-06-25 MED ORDER — ONDANSETRON HCL 4 MG PO TABS: 4.0000 mg | ORAL_TABLET | Freq: Four times a day (QID) | ORAL | Status: DC | PRN

## 2015-06-25 MED ORDER — LEVOFLOXACIN IN D5W 750 MG/150ML IV SOLN
750.0000 mg | Freq: Once | INTRAVENOUS | Status: AC
  Administered 2015-06-25: 750 mg via INTRAVENOUS
  Filled 2015-06-25: qty 150

## 2015-06-25 MED ORDER — ACETAMINOPHEN 325 MG PO TABS
650.0000 mg | ORAL_TABLET | Freq: Four times a day (QID) | ORAL | Status: DC | PRN
  Administered 2015-07-02 – 2015-07-04 (×2): 650 mg via ORAL
  Filled 2015-06-25 (×2): qty 2

## 2015-06-25 NOTE — Progress Notes (Signed)
Pt arrived to 2C17 via [facility] around 2050. Pt opens eyes spontaneously and moves all 4 extremities but does not respond to commands. On RA pt sats 92-95%, on 2L [place] pt sats are 99%. Temperature remains high between 101 and 102.4.  Tylenol suppository given at 2230 and has NOT been effective. BP trending up from 150 systolic to now 180's.  HR has been mildly tachy in the low 100's. Pt has had one episode of vomiting since arriving to 2C17, small amount of green emesis. RN will continue to monitor.   Three of the pts children arrived and stated that the pt had been having an increasingly worse headaches over the past 3 days and that she had not been acting like herself. Pt had been less talkative and very "snappy". Pts son found her naked on the floor "smashed between the bed and the closet" Son is concerned she may have hit her head.  Also, son stated that pt has had bug bites on her left forearm that had been concerning and the doctor had given her antibiotics for them.

## 2015-06-25 NOTE — ED Notes (Signed)
Pt found on floor at 1000 by husband, pt was unresponsive at time. Pt combative with EMS, 2 mg of versed given. Son reports pt was normal at 0700 this morning.

## 2015-06-25 NOTE — ED Notes (Signed)
1mg of ativan given

## 2015-06-25 NOTE — ED Notes (Signed)
Pt starting to move around more and making more facial movements. Moving head around. Dr Saul FordyceZacowski  In room

## 2015-06-25 NOTE — ED Notes (Signed)
Patient vomited large amt of bright yellow vomitus and large loose brown stool. Sats 93-94% on room air. No resp. Distress noted. lungs clear Dr Rennis ChrisJacobowitz notified

## 2015-06-25 NOTE — ED Notes (Signed)
Dr. Zackowski at bedside  

## 2015-06-25 NOTE — ED Notes (Signed)
Pt moving extremities but remains unresponsive.

## 2015-06-25 NOTE — Progress Notes (Signed)
ANTIBIOTIC CONSULT NOTE-Preliminary  Pharmacy Consult for Vancomycin, Aztreonam, Levaquin Indication: rule out sepsis  Allergies  Allergen Reactions  . Nsaids Shortness Of Breath  . Bee Venom   . Chocolate   . Darvon [Propoxyphene]   . Ivp Dye [Iodinated Diagnostic Agents] Swelling  . Penicillins Rash   Patient Measurements:   Last recorded weight = 119.7 Kg  Vital Signs: Temp: 100.5 F (38.1 C) (01/05 1101) Temp Source: Rectal (01/05 1101) BP: 144/84 mmHg (01/05 1101) Pulse Rate: 103 (01/05 1101)  Labs: No results for input(s): WBC, HGB, PLT, LABCREA, CREATININE in the last 72 hours.  CrCl cannot be calculated (Unknown ideal weight.).  No results for input(s): VANCOTROUGH, VANCOPEAK, VANCORANDOM, GENTTROUGH, GENTPEAK, GENTRANDOM, TOBRATROUGH, TOBRAPEAK, TOBRARND, AMIKACINPEAK, AMIKACINTROU, AMIKACIN in the last 72 hours.   Microbiology: No results found for this or any previous visit (from the past 720 hour(s)).  Medical History: Past Medical History  Diagnosis Date  . Chest pain     Reassuring workup of her time including negative cardiac catheterization and stress testing  . Type 2 diabetes mellitus   . Mixed hyperlipidemia   . Obesity   . Essential hypertension, benign   . COPD (chronic obstructive pulmonary disease)   . DJD (degenerative joint disease)   . Urinary incontinence   . GERD (gastroesophageal reflux disease)   . IBS (irritable bowel syndrome)   . Fibromyalgia   . Asthma   . Neuropathy    Medications:   (Not in a hospital admission)  Assessment: 65yo female presented to ED, initially unresponsive with low BP.  Asked to initiate ABX for suspected sepsis.  SCr OK.   Goal of Therapy:  Eradicate infection.  Plan:  Preliminary review of pertinent patient information completed.  Protocol will be initiated with a one-time dose(s) of Aztreonam 2gm, Levaquin 750mg , and Vancomycin 2000mg .   Jeani HawkingAnnie Penn clinical pharmacist will complete review  during morning rounds to assess patient and finalize treatment regimen.  Valrie HartHall, Eula Jaster A, [facility]inteRPH 06/25/2015,11:22 AM

## 2015-06-25 NOTE — ED Notes (Addendum)
Husband no longer at bedside. Attempted to call family to give update of pt. [facility]. No answer.

## 2015-06-25 NOTE — ED Notes (Signed)
IV antibx started prior to second set of blood culture being drawn due to fact that patient is a difficult stick and is still combative.

## 2015-06-25 NOTE — Progress Notes (Signed)
ANTIBIOTIC CONSULT NOTE-Preliminary  Pharmacy Consult for Acyclovir Indication: rule out sepsis unresponsive / r/o meningitis - CNS infection  Allergies  Allergen Reactions  . Nsaids Shortness Of Breath  . Bee Venom   . Chocolate   . Darvon [Propoxyphene]   . Ivp Dye [Iodinated Diagnostic Agents] Swelling  . Penicillins Rash   Patient Measurements:   Last recorded weight = 119.7 Kg IBW ~ 60Kg  Vital Signs: Temp: 102.4 F (39.1 C) (01/05 1400) Temp Source: Core (Comment) (01/05 1221) BP: 167/80 mmHg (01/05 1400) Pulse Rate: 103 (01/05 1330)  Labs:  Recent Labs  06/25/15 1105  WBC 19.3*  HGB 15.6*  PLT 319  CREATININE 0.85    CrCl cannot be calculated (Unknown ideal weight.).  No results for input(s): VANCOTROUGH, VANCOPEAK, VANCORANDOM, GENTTROUGH, GENTPEAK, GENTRANDOM, TOBRATROUGH, TOBRAPEAK, TOBRARND, AMIKACINPEAK, AMIKACINTROU, AMIKACIN in the last 72 hours.   Microbiology: No results found for this or any previous visit (from the past 720 hour(s)).  Medical History: Past Medical History  Diagnosis Date  . Chest pain     Reassuring workup of her time including negative cardiac catheterization and stress testing  . Type 2 diabetes mellitus   . Mixed hyperlipidemia   . Obesity   . Essential hypertension, benign   . COPD (chronic obstructive pulmonary disease)   . DJD (degenerative joint disease)   . Urinary incontinence   . GERD (gastroesophageal reflux disease)   . IBS (irritable bowel syndrome)   . Fibromyalgia   . Asthma   . Neuropathy    Medications:   (Not in a hospital admission)  Assessment: 65yo female presented to ED, initially unresponsive with low BP.  Asked to initiate ABX and Acyclovir for suspected sepsis / meningitis.  SCr OK.   Goal of Therapy:  Eradicate infection.  Plan:  Preliminary review of pertinent patient information completed.  Protocol will be initiated with a one-time dose(s) of Acyclovir 600mg .   Jeani HawkingAnnie Penn clinical  pharmacist will complete review during morning rounds to assess patient and finalize treatment regimen.  Valrie HartHall, Eberardo Demello A, RPH 06/25/2015,2:05 PM

## 2015-06-25 NOTE — ED Notes (Signed)
[facility] called [facility]

## 2015-06-25 NOTE — ED Notes (Signed)
POC lactic ran but not resulted. Attempted to transmit data, but unsuccessful. However, Lactic acid was drawn with 1st blood draw and placed on ice. Lab notified to run lactic acid on this specimen.

## 2015-06-25 NOTE — H&P (Signed)
[facility] History and Physical  Valerie Wagner ZOX:[id number] DOB: 1950/10/01 DOA: 06/25/2015  Referring physician: EDP PCP: [name], [name], MD   Chief Complaint: Fever and somnolence  HPI: Valerie Patriciaonya Swor is a 65 y.o. female with past medical history of type 2 diabetes mellitus, essential hypertension, chronic back pain and obesity. She was brought to the hospital by EMS for altered mental status and fever. She is unresponsive, the history was taken from EDP notes and husband at bedside, husband reported he found her on the floor, he tried to wake her up but she is unresponsive so he called 911. On route according to EMS she was combative and unresponsive so she was given 1 dose of Versed. In the ED temperature 102.9, WBC is 19.3 and lactic acid of 3.8. Code sepsis initiated, quick scan without contrast of head and abdomen/pelvis did not show acute abnormalities (both pictures motion degraded), patient responds to painful stimuli able to protect her airway. Unable to identify the reason for sepsis, patient needs LP, [facility].  Review of Systems:  Unable to do review of systems secondary to unresponsiveness  Past Medical History  Diagnosis Date  . Chest pain     Reassuring workup of her time including negative cardiac catheterization and stress testing  . Type 2 diabetes mellitus   . Mixed hyperlipidemia   . Obesity   . Essential hypertension, benign   . COPD (chronic obstructive pulmonary disease)   . DJD (degenerative joint disease)   . Urinary incontinence   . GERD (gastroesophageal reflux disease)   . IBS (irritable bowel syndrome)   . Fibromyalgia   . Asthma   . Neuropathy    Past Surgical History  Procedure Laterality Date  . Tonsillectomy    . Hysterectomy -- unknown    . Cholecystectomy    . Carpal tunnel release      Right  . Left thumb surgery    . Cesarean section     Social History:   reports that she quit smoking about 33  years ago. Her smoking use included Cigarettes. She has a 2 pack-year smoking history. She has never used smokeless tobacco. She reports that she does not drink alcohol or use illicit drugs.  Allergies  Allergen Reactions  . Nsaids Shortness Of Breath  . Bee Venom   . Chocolate   . Darvon [Propoxyphene]   . Ivp Dye [Iodinated Diagnostic Agents] Swelling  . Penicillins Rash    Family History  Problem Relation Age of Onset  . Coronary artery disease Father   . Heart attack Father     Died age 63     Prior to Admission medications   Medication Sig Start Date End Date Taking? Authorizing Provider  albuterol (VENTOLIN HFA) 108 (90 BASE) MCG/ACT inhaler Inhale 2 puffs into the lungs every 6 (six) hours as needed for wheezing or shortness of breath.    Yes Historical Provider, MD  ALPRAZolam Prudy Feeler(XANAX) 0.5 MG tablet Take 0.5 mg by mouth 3 (three) times daily as needed for anxiety or sleep.    Yes Historical Provider, MD  benazepril-hydrochlorthiazide (LOTENSIN HCT) 20-12.5 MG per tablet Take 2 tablets by mouth daily.    Yes Historical Provider, MD  EPINEPHrine (EPIPEN JR) 0.15 MG/0.3ML injection Inject 0.15 mg into the muscle as needed for anaphylaxis.   Yes Historical Provider, MD  Fluticasone-Salmeterol (ADVAIR) 500-50 MCG/DOSE AEPB Inhale 1 puff into the lungs every 12 (twelve) hours.   Yes Historical  Provider, MD  glipiZIDE (GLUCOTROL XL) 10 MG 24 hr tablet Take 10 mg by mouth daily with breakfast.   Yes Historical Provider, MD  HYDROcodone-acetaminophen (NORCO) 7.5-325 MG per tablet Take 1 tablet by mouth every 6 (six) hours as needed for moderate pain.   Yes Historical Provider, MD  insulin glargine (LANTUS) 100 UNIT/ML injection Inject 70 Units into the skin at bedtime.    Yes Historical Provider, MD  ipratropium-albuterol (DUONEB) 0.5-2.5 (3) MG/3ML SOLN Take 3 mLs by nebulization every 4 (four) hours as needed (wheezing).    Yes Historical Provider, MD  nitroGLYCERIN (NITROSTAT) 0.4 MG  SL tablet Place 0.4 mg under the tongue every 5 (five) minutes as needed for chest pain.    Yes Historical Provider, MD  Potassium Citrate (UROCIT-K 15) 15 MEQ (1620 MG) TBCR Take 1 tablet by mouth 2 (two) times daily.   Yes Historical Provider, MD  pregabalin (LYRICA) 50 MG capsule Take 50 mg by mouth 2 (two) times daily.   Yes Historical Provider, MD  tiotropium (SPIRIVA) 18 MCG inhalation capsule Place 18 mcg into inhaler and inhale daily.   Yes Historical Provider, MD   Physical Exam: Filed Vitals:   06/25/15 1330 06/25/15 1400  BP: 174/82 167/80  Pulse: 103   Temp: 102.4 F (39.1 C) 102.4 F (39.1 C)  Resp: 17 16   Constitutional: Responds only to painful stimuli, she clenches her mouth, obese Caucasian female Head: Normocephalic and atraumatic.  Nose: Nose normal.  Mouth/Throat: Very dry mucous membranes and lips. Eyes: Conjunctivae and EOM are normal. Pupils are equal, round, and reactive to light.  Neck: Trachea normal and normal range of motion. Neck supple.  Cardiovascular: Normal rate, regular rhythm, S1 normal, S2 normal, normal heart sounds and intact distal pulses.   Pulmonary/Chest: Effort normal and breath sounds normal.  Abdominal: Soft. Bowel sounds are normal. There is no hepatosplenomegaly. There is no tenderness.  Musculoskeletal: No lower extremity edema  Neurological: Responds only to painful stimuli. Skin: Skin is warm, dry and intact.  Psychiatric: Unable to examine  Labs on Admission:  Basic Metabolic Panel:  Recent Labs Lab 06/25/15 1105  NA 139  K 3.7  CL 101  CO2 25  GLUCOSE 351*  BUN 11  CREATININE 0.85  CALCIUM 10.0   Liver Function Tests:  Recent Labs Lab 06/25/15 1105  AST 38  ALT 36  ALKPHOS 81  BILITOT 0.9  PROT 8.1  ALBUMIN 4.5   No results for input(s): LIPASE, AMYLASE in the last 168 hours. No results for input(s): AMMONIA in the last 168 hours. CBC:  Recent Labs Lab 06/25/15 1105  WBC 19.3*  NEUTROABS 16.7*  HGB  15.6*  HCT 45.1  MCV 90.7  PLT 319   Cardiac Enzymes: No results for input(s): CKTOTAL, CKMB, CKMBINDEX, TROPONINI in the last 168 hours.  BNP (last 3 results) No results for input(s): BNP in the last 8760 hours.  ProBNP (last 3 results) No results for input(s): PROBNP in the last 8760 hours.  CBG: No results for input(s): GLUCAP in the last 168 hours.  Radiological Exams on Admission: Ct Abdomen Pelvis Wo Contrast  06/25/2015  CLINICAL DATA:  Found on the floor unresponsive at 10 a.m. EXAM: CT ABDOMEN AND PELVIS WITHOUT CONTRAST TECHNIQUE: Multidetector CT imaging of the abdomen and pelvis was performed following the standard protocol without IV contrast. COMPARISON:  10/14/2013, 05/20/2014 FINDINGS: Lower chest:  Clear lung bases.  Normal heart size. Hepatobiliary: Normal liver.  Prior cholecystectomy. Pancreas: Normal.  Spleen: Normal. Adrenals/Urinary Tract: Normal adrenal glands. 12 mm exophytic hypodense, fluid attenuating right renal mass most consistent with a cyst. No urolithiasis or obstructive uropathy. Decompressed bladder with a Foley catheter present. Stomach/Bowel: No bowel wall thickening or dilatation. Small hiatal hernia. No pneumatosis, pneumoperitoneum or portal venous gas. No abdominal pelvic free fluid. Vascular/Lymphatic: Normal caliber abdominal aorta with mild atherosclerosis. No lymphadenopathy. Reproductive: No adnexal mass.  Prior hysterectomy. Musculoskeletal: No acute osseous abnormality. No lytic or sclerotic osseous lesion. There is a 2.3 cm hypodense, fluid attenuating partially visualized mass in the left adductor brevis muscle concerning for a liquified hematoma versus ganglion cyst, but this is incompletely characterized. IMPRESSION: 1. No acute abdominal or pelvic pathology. 2. 2.3 cm hypodense, fluid attenuating partially visualized mass in the left adductor brevis muscle concerning for a liquified hematoma versus ganglion cyst, but this is incompletely  characterized. This is unchanged compared with 05/20/2014. Electronically Signed   By: Elige Ko   On: 06/25/2015 12:25   Ct Head Wo Contrast  06/25/2015  CLINICAL DATA:  Found on floor.  Altered mental status EXAM: CT HEAD WITHOUT CONTRAST TECHNIQUE: Contiguous axial images were obtained from the base of the skull through the vertex without intravenous contrast. COMPARISON:  MRI 01/22/2015 FINDINGS: Image quality degraded by significant motion. This study was repeated however there is persistent motion on the repeat exam Ventricle size normal. Negative for acute infarct. Negative for acute hemorrhage or mass. Subtle findings could be missed on the study given the amount of motion. IMPRESSION: Image quality degraded by significant motion. No acute abnormality. Repeat study recommended if symptoms persist. Electronically Signed   By: Marlan Palau M.D.   On: 06/25/2015 12:24   Dg Chest Port 1 View  06/25/2015  CLINICAL DATA:  Altered mental status. EXAM: PORTABLE CHEST 1 VIEW COMPARISON:  01/21/2015 FINDINGS: Cardiac enlargement with vascular congestion. Mild right lower lobe atelectasis. No effusion. IMPRESSION: Cardiac enlargement with vascular congestion Mild right lower lobe atelectasis. Electronically Signed   By: Marlan Palau M.D.   On: 06/25/2015 11:44    EKG: Independently reviewed. Sinus tachycardia at 100.  Assessment/Plan Principal Problem:   Severe sepsis (HCC) Active Problems:   Essential hypertension, benign   Somnolence   Chronic pain    Severe sepsis Patient presented with temperature of 102.6, WBC of 19.3 and suspicion of infection. Lactic acid of 3.8. Reportedly BP was 60/40 and pulse of 120 on route to the hospital. Patient started on aggressive IV fluid hydration, 30 mL/KG bolus, she will receive 4 L of fluids. Continue IV fluids at 100. Started on broad-spectrum antibiotics after culture obtained, she will be on levofloxacin, aztreonam and vancomycin. No  identifiable reason for sepsis, will rule out meningitis needs help.  Somnolence Altered mental status secondary to sepsis, cannot rule out meningitis. I couldn't appreciate neck stiffness, but still cannot rule out meningitis as a cause of sepsis. Able to protect her airway, tongue depressor used, patient has gag reflex and clenches her teeth.  Chronic pain Hold narcotic home medications.  Essential hypertension Hold home medications.   Code Status: Full code Family Communication: plan discussed with hospital bedside.  Disposition Plan: Stepdown, transferred to [facility] for consideration of LP. DVT prophylaxis: Started on SCDs can be placed on Lovenox or subcutaneous heparin after obtaining LP.  Time spent: 70 minutes  Hadasah Brugger A, MD [facility] Pager [phone]

## 2015-06-25 NOTE — ED Notes (Signed)
[facility] aware of pt., waiting on bed to be assigned.

## 2015-06-25 NOTE — ED Notes (Addendum)
Pt. sats remain 100% on 2L. Nasal canula removed, if sats remain good, will draw a venous blood gas per EDP order.

## 2015-06-25 NOTE — ED Notes (Signed)
CRITICAL VALUE ALERT  Critical value received:  Lactic acid 3.8  Date of notification:  06/25/15  Time of notification:  1318  Critical value read back:Yes.    Nurse who received alert:  Docia BarrierMeagan Cristiano Capri MD notified (1st page): Dr. Ethelda ChickJacubowitz.

## 2015-06-25 NOTE — ED Notes (Signed)
Son called room requesting to speak with mother. Informed she would be transferred to [place]. States he is on his way

## 2015-06-25 NOTE — ED Notes (Signed)
lactic acid obtained left hand

## 2015-06-25 NOTE — ED Notes (Signed)
Soft wrist restraints applied

## 2015-06-25 NOTE — ED Notes (Signed)
Dr Deretha EmoryZackowski notified that pt vomited small amt of green substance. Noted to be wheezing at this time. o2 sat 93 % on room air. Resp 14. Order to obtain portable cxr and fluids at 100ml /hr

## 2015-06-25 NOTE — Progress Notes (Signed)
eLink Physician-Brief Progress Note Patient Name: Valerie Wagner DOB: [birth year] MRN: [id number]   Date of Service  06/25/2015  HPI/Events of Note  Spoke with EDP at OSH. Patient presented with being found down at home this AM by husband comatose. Patient combative for EMS and given Ativan. Lactic acidosis is improving post IVF. Mental status also improving as patient now opening eyes spontaneously and looking around but not following commands. Hyperkalemia & early signs of DKA on labs. No obvious source of infection with UA & CXR. Patient did have some nausea & emesis. Question possible overdose, myocardial infarction, etc.  eICU Interventions  1. Continue current plan for SDU admission 2. Close monitoring of mental status 3. Consider EEG 4. Consider Urine Drug Screen 5. Consider Troponin I, total CK, & CKMB 6. Consider serum Procalcitonin     Intervention Category Evaluation Type: New Patient Evaluation  Valerie Wagner 06/25/2015, 7:26 PM

## 2015-06-25 NOTE — ED Notes (Signed)
Pt continues to be combative. Another order for ativan given,

## 2015-06-25 NOTE — ED Notes (Signed)
Respiratory notified of venous blood gas. 

## 2015-06-25 NOTE — ED Provider Notes (Addendum)
CSN: [id number]     Arrival date & time 06/25/15  1058 History   None   Seen on arrival Chief Complaint  Patient presents with  . Altered Mental Status     (Consider location/radiation/quality/duration/timing/severity/associated sxs/prior Treatment) HPI Level V caveat altered mental status history is obtained from EMS. I attempted to call patient's home for further history. No answer. EMS reports to me the patient was well at 7 AM. A thallium or asked if she wants a beach she stated no. Shortly thereafter patient was on the floor. Initially unresponsive. Patient regained consciousness spontaneously, and became combative. EMS performed Cb which was 382. They reported to me that blood pressure in the field was 60/40 pulse 120. They treated patient with intraosseous line into her left shin as they were unable to establish IV access she received saline 300 mL via intraosseous line prior to arrival. EMS called me for online commanded patient was combative and they could not manage her in the right ambulance. I ordered Versed 2 mg IO in the prehospital setting.  Past Medical History  Diagnosis Date  . Chest pain     Reassuring workup of her time including negative cardiac catheterization and stress testing  . Type 2 diabetes mellitus   . Mixed hyperlipidemia   . Obesity   . Essential hypertension, benign   . COPD (chronic obstructive pulmonary disease)   . DJD (degenerative joint disease)   . Urinary incontinence   . GERD (gastroesophageal reflux disease)   . IBS (irritable bowel syndrome)   . Fibromyalgia   . Asthma   . Neuropathy    Past Surgical History  Procedure Laterality Date  . Tonsillectomy    . Hysterectomy -- unknown    . Cholecystectomy    . Carpal tunnel release      Right  . Left thumb surgery    . Cesarean section     Family History  Problem Relation Age of Onset  . Coronary artery disease Father   . Heart attack Father     Died age 82   Social History  Substance  Use Topics  . Smoking status: Former Smoker -- 1.00 packs/day for 2 years    Types: Cigarettes    Quit date: 06/20/1982  . Smokeless tobacco: Never Used  . Alcohol Use: No   OB History    No data available     Review of Systems  Unable to perform ROS: Mental status change  Allergic/Immunologic: Positive for immunocompromised state.       Diabetic      Allergies  Nsaids; Bee venom; Chocolate; Darvon; Ivp dye; and Penicillins  Home Medications   Prior to Admission medications   Medication Sig Start Date End Date Taking? Authorizing Provider  albuterol (VENTOLIN HFA) 108 (90 BASE) MCG/ACT inhaler Inhale 1 puff into the lungs every 6 (six) hours as needed for wheezing or shortness of breath.    Historical Provider, MD  ALPRAZolam Prudy Feeler) 0.5 MG tablet Take 0.5 mg by mouth 3 (three) times daily as needed for anxiety or sleep.     Historical Provider, MD  atorvastatin (LIPITOR) 80 MG tablet Take 80 mg by mouth daily.    Historical Provider, MD  benazepril-hydrochlorthiazide (LOTENSIN HCT) 20-12.5 MG per tablet Take 1 tablet by mouth daily.    Historical Provider, MD  cyclobenzaprine (FLEXERIL) 10 MG tablet Take 10 mg by mouth at bedtime as needed for muscle spasms.    Historical Provider, MD  DULoxetine (CYMBALTA) 60 MG  capsule Take 60 mg by mouth daily.    Historical Provider, MD  EPINEPHrine (EPIPEN JR) 0.15 MG/0.3ML injection Inject 0.15 mg into the muscle as needed for anaphylaxis.    Historical Provider, MD  Fluticasone-Salmeterol (ADVAIR) 500-50 MCG/DOSE AEPB Inhale 1 puff into the lungs every 12 (twelve) hours.    Historical Provider, MD  glipiZIDE (GLUCOTROL XL) 10 MG 24 hr tablet Take 10 mg by mouth daily with breakfast.    Historical Provider, MD  HYDROcodone-acetaminophen (NORCO) 5-325 MG per tablet Take 2 tablets by mouth every 4 (four) hours as needed. 04/07/14   Blane Ohara, MD  HYDROcodone-acetaminophen (NORCO) 7.5-325 MG per tablet Take 1 tablet by mouth every 6 (six)  hours as needed for moderate pain.    Historical Provider, MD  insulin glargine (LANTUS) 100 UNIT/ML injection Inject 42 Units into the skin at bedtime.     Historical Provider, MD  ipratropium (ATROVENT HFA) 17 MCG/ACT inhaler Inhale 1 puff into the lungs 4 (four) times daily as needed.    Historical Provider, MD  ipratropium-albuterol (DUONEB) 0.5-2.5 (3) MG/3ML SOLN Take 3 mLs by nebulization every 6 (six) hours as needed (for shortness of breath).     Historical Provider, MD  nitroGLYCERIN (NITROSTAT) 0.4 MG SL tablet Place 0.4 mg under the tongue every 5 (five) minutes as needed.    Historical Provider, MD  ondansetron (ZOFRAN ODT) 4 MG disintegrating tablet 4mg  ODT q4 hours prn nausea/vomit 10/14/13   Bethann Berkshire, MD  ondansetron (ZOFRAN ODT) 4 MG disintegrating tablet 4mg  ODT q4 hours prn nausea/vomit 04/07/14   Blane Ohara, MD  oxyCODONE-acetaminophen (PERCOCET/ROXICET) 5-325 MG per tablet Take 1 tablet by mouth every 6 (six) hours as needed for severe pain. 10/14/13   Bethann Berkshire, MD  potassium chloride SA (KLOR-CON M15) 15 MEQ tablet Take 15 mEq by mouth 2 (two) times daily.    Historical Provider, MD  pregabalin (LYRICA) 50 MG capsule Take 50 mg by mouth 2 (two) times daily.    Historical Provider, MD  tamsulosin (FLOMAX) 0.4 MG CAPS capsule Take 1 capsule (0.4 mg total) by mouth daily. 10/14/13   Bethann Berkshire, MD  tiotropium (SPIRIVA) 18 MCG inhalation capsule Place 18 mcg into inhaler and inhale daily.    Historical Provider, MD  traMADol (ULTRAM) 50 MG tablet Take 50 mg by mouth every 8 (eight) hours as needed for moderate pain.     Historical Provider, MD   BP 144/84 mmHg  Pulse 103  Temp(Src) 100.5 F (38.1 C) (Rectal)  Resp 27  SpO2 100% Physical Exam  Constitutional:  Chronically and acutely ill-appearing  HENT:  Head: Normocephalic and atraumatic.  Mucous membranes dry  Eyes: Conjunctivae are normal. Pupils are equal, round, and reactive to light.  Neck: Neck supple.  No tracheal deviation present. No thyromegaly present.  Cardiovascular: Normal rate and regular rhythm.   No murmur heard. Pulmonary/Chest: Effort normal and breath sounds normal.  Abdominal: Soft. Bowel sounds are normal. She exhibits no distension. There is no tenderness.  Morbidly obese  Musculoskeletal: Normal range of motion. She exhibits no edema or tenderness.  Motor strength 5 over 5 overall  Neurological: She is alert.  Strength 5 over 5 overall response to noxious stimulus with purposeful movement. Glasgow Coma Score equals 12. 5 for motor,4 for eye opening, 3 for verbal  Skin: Skin is warm and dry. Rash noted.  [place] abdomen  Psychiatric: She has a normal mood and affect.  Nursing note and vitals reviewed.   ED  Course  Procedures (including critical care time) Labs Review Labs Reviewed  CULTURE, BLOOD (ROUTINE X 2)  CULTURE, BLOOD (ROUTINE X 2)  URINE CULTURE  COMPREHENSIVE METABOLIC PANEL  CBC WITH DIFFERENTIAL/PLATELET  URINALYSIS, ROUTINE W REFLEX MICROSCOPIC (NOT [facility])  I-STAT CG4 LACTIC ACID, ED    Imaging Review No results found. I have personally reviewed and evaluated these images and lab results as part of my medical decision-making.   EKG Interpretation   Date/Time:  Thursday June 25 2015 11:15:08 EST Ventricular Rate:  100 PR Interval:  187 QRS Duration: 93 QT Interval:  330 QTC Calculation: 426 R Axis:   56 Text Interpretation:  Sinus tachycardia No significant change since last  tracing Confirmed by Ethelda ChickJACUBOWITZ  MD, [place] Anagnos [id number]) on 06/25/2015 11:25:09 AM     patient required Ativan for sedation as she was flailing all extremities and could not cooperate with imaging for CT scan 12:45 PM patient's mental status remains unchanged. She is nonverbal. Moves all extremities. Does not follow some commands. Glasgow Coma Score 12. Patient's husband arrived at 12:45 PM. Further history from him she was last seen by him is normal at 9 PM yesterday.  He found her on the floor of the bedroom this morning. He was unable to get her onto her feet.  called 911 1:40 PM patient is sleeping opens eyes to verbal stimulus. Does not follow commands. Moves all extremities Results for orders placed or performed during the hospital encounter of 06/25/15  Comprehensive metabolic panel  Result Value Ref Range   Sodium 139 135 - 145 mmol/L   Potassium 3.7 3.5 - 5.1 mmol/L   Chloride 101 101 - 111 mmol/L   CO2 25 22 - 32 mmol/L   Glucose, Bld 351 (H) 65 - 99 mg/dL   BUN 11 6 - 20 mg/dL   Creatinine, Ser 4.030.85 0.44 - 1.00 mg/dL   Calcium 47.410.0 8.9 - 25.910.3 mg/dL   Total Protein 8.1 6.5 - 8.1 g/dL   Albumin 4.5 3.5 - 5.0 g/dL   AST 38 15 - 41 U/L   ALT 36 14 - 54 U/L   Alkaline Phosphatase 81 38 - 126 U/L   Total Bilirubin 0.9 0.3 - 1.2 mg/dL   GFR calc non Af Amer >60 >60 mL/min   GFR calc Af Amer >60 >60 mL/min   Anion gap 13 5 - 15  CBC WITH DIFFERENTIAL  Result Value Ref Range   WBC 19.3 (H) 4.0 - 10.5 K/uL   RBC 4.97 3.87 - 5.11 MIL/uL   Hemoglobin 15.6 (H) 12.0 - 15.0 g/dL   HCT 56.345.1 87.536.0 - 64.346.0 %   MCV 90.7 78.0 - 100.0 fL   MCH 31.4 26.0 - 34.0 pg   MCHC 34.6 30.0 - 36.0 g/dL   RDW 32.912.1 51.811.5 - 84.115.5 %   Platelets 319 150 - 400 K/uL   Neutrophils Relative % 86 %   Neutro Abs 16.7 (H) 1.7 - 7.7 K/uL   Lymphocytes Relative 10 %   Lymphs Abs 1.8 0.7 - 4.0 K/uL   Monocytes Relative 3 %   Monocytes Absolute 0.6 0.1 - 1.0 K/uL   Eosinophils Relative 1 %   Eosinophils Absolute 0.1 0.0 - 0.7 K/uL   Basophils Relative 0 %   Basophils Absolute 0.1 0.0 - 0.1 K/uL  Urinalysis, Routine w reflex microscopic (not at [facility])  Result Value Ref Range   Color, Urine YELLOW YELLOW   APPearance CLEAR CLEAR   Specific Gravity, Urine 1.025  1.005 - 1.030   pH 5.5 5.0 - 8.0   Glucose, UA >1000 (A) NEGATIVE mg/dL   Hgb urine dipstick MODERATE (A) NEGATIVE   Bilirubin Urine NEGATIVE NEGATIVE   Ketones, ur 40 (A) NEGATIVE mg/dL   Protein, ur 409 (A)  NEGATIVE mg/dL   Nitrite NEGATIVE NEGATIVE   Leukocytes, UA NEGATIVE NEGATIVE  Urine microscopic-add on  Result Value Ref Range   Squamous Epithelial / LPF NONE SEEN NONE SEEN   WBC, UA NONE SEEN 0 - 5 WBC/hpf   RBC / HPF 6-30 0 - 5 RBC/hpf   Bacteria, UA NONE SEEN NONE SEEN  Lactic acid, plasma  Result Value Ref Range   Lactic Acid, Venous 3.8 (HH) 0.5 - 2.0 mmol/L   Ct Abdomen Pelvis Wo Contrast  06/25/2015  CLINICAL DATA:  Found on the floor unresponsive at 10 a.m. EXAM: CT ABDOMEN AND PELVIS WITHOUT CONTRAST TECHNIQUE: Multidetector CT imaging of the abdomen and pelvis was performed following the standard protocol without IV contrast. COMPARISON:  10/14/2013, 05/20/2014 FINDINGS: Lower chest:  Clear lung bases.  Normal heart size. Hepatobiliary: Normal liver.  Prior cholecystectomy. Pancreas: Normal. Spleen: Normal. Adrenals/Urinary Tract: Normal adrenal glands. 12 mm exophytic hypodense, fluid attenuating right renal mass most consistent with a cyst. No urolithiasis or obstructive uropathy. Decompressed bladder with a Foley catheter present. Stomach/Bowel: No bowel wall thickening or dilatation. Small hiatal hernia. No pneumatosis, pneumoperitoneum or portal venous gas. No abdominal pelvic free fluid. Vascular/Lymphatic: Normal caliber abdominal aorta with mild atherosclerosis. No lymphadenopathy. Reproductive: No adnexal mass.  Prior hysterectomy. Musculoskeletal: No acute osseous abnormality. No lytic or sclerotic osseous lesion. There is a 2.3 cm hypodense, fluid attenuating partially visualized mass in the left adductor brevis muscle concerning for a liquified hematoma versus ganglion cyst, but this is incompletely characterized. IMPRESSION: 1. No acute abdominal or pelvic pathology. 2. 2.3 cm hypodense, fluid attenuating partially visualized mass in the left adductor brevis muscle concerning for a liquified hematoma versus ganglion cyst, but this is incompletely characterized. This is  unchanged compared with 05/20/2014. Electronically Signed   By: Elige Ko   On: 06/25/2015 12:25   Ct Head Wo Contrast  06/25/2015  CLINICAL DATA:  Found on floor.  Altered mental status EXAM: CT HEAD WITHOUT CONTRAST TECHNIQUE: Contiguous axial images were obtained from the base of the skull through the vertex without intravenous contrast. COMPARISON:  MRI 01/22/2015 FINDINGS: Image quality degraded by significant motion. This study was repeated however there is persistent motion on the repeat exam Ventricle size normal. Negative for acute infarct. Negative for acute hemorrhage or mass. Subtle findings could be missed on the study given the amount of motion. IMPRESSION: Image quality degraded by significant motion. No acute abnormality. Repeat study recommended if symptoms persist. Electronically Signed   By: Marlan Palau M.D.   On: 06/25/2015 12:24   Dg Chest Port 1 View  06/25/2015  CLINICAL DATA:  Altered mental status. EXAM: PORTABLE CHEST 1 VIEW COMPARISON:  01/21/2015 FINDINGS: Cardiac enlargement with vascular congestion. Mild right lower lobe atelectasis. No effusion. IMPRESSION: Cardiac enlargement with vascular congestion Mild right lower lobe atelectasis. Electronically Signed   By: Marlan Palau M.D.   On: 06/25/2015 11:44    MDM  Code sepsis called based on sirs criteria fever, tachycardia, tachypnea  Pt  Final diagnoses:  Altered mental status   concern for encephalitis , cerebritis, meningitis. I don't feel the patient could undergo LP successfully without general anesthesia as she moves about, she also has  difficult body habitus due to morbid obesity Dr Arthor Captain consulted, saw pt in the ED and arranged for transfer to [facility] Dx #1 Severe sepsis# #2Acute encephalopathy #3htyperglycemia CRITICAL CARE Performed by: Doug Sou Total critical care time: 60 minutes Critical care time was exclusive of separately billable procedures and treating other patients. Critical care was  necessary to treat or prevent imminent or life-threatening deterioration. Critical care was time spent personally by me on the following activities: development of treatment plan with patient and/or surrogate as well as nursing, discussions with consultants, evaluation of patient's response to treatment, examination of patient, obtaining history from patient or surrogate, ordering and performing treatments and interventions, ordering and review of laboratory studies, ordering and review of radiographic studies, pulse oximetry and re-evaluation of patient's condition.    Doug Sou, MD 06/25/15 1630  Addendum 4:35 PM. I have been notified the patient vomited one time and had one episode of diarrhea. On repeat exam she remains with eyes open, nonverbal. Moving all extremities. Pulse oximetry on room air 95%. Lungs clear to auscultation. Neurologic status essentially unchanged. End of my shift. Dr Deretha Emory aware of pt pending transfer  Doug Sou, MD 06/25/15 1640  Doug Sou, MD 06/25/15 [phone]

## 2015-06-25 NOTE — ED Notes (Signed)
Attempting another IV site, pt is a difficult stick.

## 2015-06-26 ENCOUNTER — Inpatient Hospital Stay (HOSPITAL_COMMUNITY): Payer: Medicaid Other

## 2015-06-26 ENCOUNTER — Other Ambulatory Visit (HOSPITAL_COMMUNITY): Payer: Medicaid Other

## 2015-06-26 ENCOUNTER — Encounter: Payer: Self-pay | Admitting: Diagnostic Radiology

## 2015-06-26 DIAGNOSIS — G629 Polyneuropathy, unspecified: Secondary | ICD-10-CM

## 2015-06-26 DIAGNOSIS — G9340 Encephalopathy, unspecified: Secondary | ICD-10-CM

## 2015-06-26 DIAGNOSIS — A419 Sepsis, unspecified organism: Secondary | ICD-10-CM | POA: Diagnosis present

## 2015-06-26 DIAGNOSIS — R4182 Altered mental status, unspecified: Secondary | ICD-10-CM

## 2015-06-26 DIAGNOSIS — E119 Type 2 diabetes mellitus without complications: Secondary | ICD-10-CM | POA: Insufficient documentation

## 2015-06-26 DIAGNOSIS — R509 Fever, unspecified: Secondary | ICD-10-CM

## 2015-06-26 LAB — LACTIC ACID, PLASMA
LACTIC ACID, VENOUS: 2.3 mmol/L — AB (ref 0.5–2.0)
Lactic Acid, Venous: 2.2 mmol/L (ref 0.5–2.0)

## 2015-06-26 LAB — CBC WITH DIFFERENTIAL/PLATELET
BASOS ABS: 0 10*3/uL (ref 0.0–0.1)
Basophils Relative: 0 %
EOS ABS: 0 10*3/uL (ref 0.0–0.7)
EOS PCT: 0 %
HCT: 36.2 % (ref 36.0–46.0)
Hemoglobin: 12 g/dL (ref 12.0–15.0)
LYMPHS PCT: 12 %
Lymphs Abs: 1.7 10*3/uL (ref 0.7–4.0)
MCH: 29.8 pg (ref 26.0–34.0)
MCHC: 33.1 g/dL (ref 30.0–36.0)
MCV: 89.8 fL (ref 78.0–100.0)
MONO ABS: 0.7 10*3/uL (ref 0.1–1.0)
Monocytes Relative: 5 %
Neutro Abs: 11.3 10*3/uL — ABNORMAL HIGH (ref 1.7–7.7)
Neutrophils Relative %: 83 %
PLATELETS: 229 10*3/uL (ref 150–400)
RBC: 4.03 MIL/uL (ref 3.87–5.11)
RDW: 12.1 % (ref 11.5–15.5)
WBC: 13.8 10*3/uL — ABNORMAL HIGH (ref 4.0–10.5)

## 2015-06-26 LAB — BASIC METABOLIC PANEL
ANION GAP: 11 (ref 5–15)
BUN: 6 mg/dL (ref 6–20)
CALCIUM: 8.5 mg/dL — AB (ref 8.9–10.3)
CO2: 20 mmol/L — ABNORMAL LOW (ref 22–32)
Chloride: 108 mmol/L (ref 101–111)
Creatinine, Ser: 0.77 mg/dL (ref 0.44–1.00)
Glucose, Bld: 282 mg/dL — ABNORMAL HIGH (ref 65–99)
Potassium: 4.8 mmol/L (ref 3.5–5.1)
SODIUM: 139 mmol/L (ref 135–145)

## 2015-06-26 LAB — COMPREHENSIVE METABOLIC PANEL
ALK PHOS: 65 U/L (ref 38–126)
ALT: 30 U/L (ref 14–54)
AST: 32 U/L (ref 15–41)
Albumin: 3.6 g/dL (ref 3.5–5.0)
Anion gap: 14 (ref 5–15)
BUN: 7 mg/dL (ref 6–20)
CALCIUM: 8.7 mg/dL — AB (ref 8.9–10.3)
CHLORIDE: 104 mmol/L (ref 101–111)
CO2: 20 mmol/L — AB (ref 22–32)
CREATININE: 0.89 mg/dL (ref 0.44–1.00)
Glucose, Bld: 311 mg/dL — ABNORMAL HIGH (ref 65–99)
Potassium: 3.6 mmol/L (ref 3.5–5.1)
SODIUM: 138 mmol/L (ref 135–145)
Total Bilirubin: 0.6 mg/dL (ref 0.3–1.2)
Total Protein: 6.5 g/dL (ref 6.5–8.1)

## 2015-06-26 LAB — URINE CULTURE: CULTURE: NO GROWTH

## 2015-06-26 LAB — GLUCOSE, CAPILLARY
GLUCOSE-CAPILLARY: 264 mg/dL — AB (ref 65–99)
GLUCOSE-CAPILLARY: 265 mg/dL — AB (ref 65–99)
GLUCOSE-CAPILLARY: 267 mg/dL — AB (ref 65–99)
GLUCOSE-CAPILLARY: 293 mg/dL — AB (ref 65–99)
Glucose-Capillary: 231 mg/dL — ABNORMAL HIGH (ref 65–99)
Glucose-Capillary: 260 mg/dL — ABNORMAL HIGH (ref 65–99)

## 2015-06-26 LAB — BLOOD GAS, ARTERIAL
Acid-base deficit: 3.4 mmol/L — ABNORMAL HIGH (ref 0.0–2.0)
Bicarbonate: 20.8 mEq/L (ref 20.0–24.0)
Drawn by: 445891
O2 Content: 2 L/min
O2 Saturation: 93.8 %
PCO2 ART: 35.7 mmHg (ref 35.0–45.0)
PO2 ART: 69.3 mmHg — AB (ref 80.0–100.0)
Patient temperature: 98.6
TCO2: 21.9 mmol/L (ref 0–100)
pH, Arterial: 7.384 (ref 7.350–7.450)

## 2015-06-26 LAB — BRAIN NATRIURETIC PEPTIDE: B NATRIURETIC PEPTIDE 5: 362.7 pg/mL — AB (ref 0.0–100.0)

## 2015-06-26 LAB — T4, FREE: FREE T4: 0.78 ng/dL (ref 0.61–1.12)

## 2015-06-26 LAB — PROCALCITONIN: Procalcitonin: <0.1 ng/mL

## 2015-06-26 LAB — TSH: TSH: 0.958 u[IU]/mL (ref 0.350–4.500)

## 2015-06-26 LAB — AMMONIA: AMMONIA: 44 umol/L — AB (ref 9–35)

## 2015-06-26 MED ORDER — INSULIN ASPART 100 UNIT/ML ~~LOC~~ SOLN
0.0000 [IU] | Freq: Four times a day (QID) | SUBCUTANEOUS | Status: DC
Start: 2015-06-26 — End: 2015-06-27
  Administered 2015-06-27: 8 [IU] via SUBCUTANEOUS
  Administered 2015-06-27: 5 [IU] via SUBCUTANEOUS

## 2015-06-26 MED ORDER — LORAZEPAM 2 MG/ML IJ SOLN
0.5000 mg | Freq: Once | INTRAMUSCULAR | Status: AC
  Administered 2015-06-26: 0.5 mg via INTRAVENOUS

## 2015-06-26 MED ORDER — DEXTROSE 5 % IV SOLN
600.0000 mg | Freq: Three times a day (TID) | INTRAVENOUS | Status: DC
  Administered 2015-06-26 – 2015-07-01 (×16): 600 mg via INTRAVENOUS
  Filled 2015-06-26 (×21): qty 12

## 2015-06-26 MED ORDER — VANCOMYCIN HCL IN DEXTROSE 1-5 GM/200ML-% IV SOLN
1000.0000 mg | Freq: Once | INTRAVENOUS | Status: DC
Start: 2015-06-26 — End: 2015-06-26

## 2015-06-26 MED ORDER — LORAZEPAM 2 MG/ML IJ SOLN
INTRAMUSCULAR | Status: AC
  Filled 2015-06-26: qty 1

## 2015-06-26 MED ORDER — MORPHINE SULFATE (PF) 2 MG/ML IV SOLN
1.0000 mg | INTRAVENOUS | Status: AC
Start: 2015-06-26 — End: 2015-06-26
  Administered 2015-06-26: 1 mg via INTRAVENOUS
  Filled 2015-06-26: qty 1

## 2015-06-26 MED ORDER — HALOPERIDOL LACTATE 5 MG/ML IJ SOLN
1.0000 mg | Freq: Four times a day (QID) | INTRAMUSCULAR | Status: DC | PRN
  Administered 2015-06-27 (×2): 1 mg via INTRAVENOUS
  Filled 2015-06-26 (×2): qty 1

## 2015-06-26 MED ORDER — CETYLPYRIDINIUM CHLORIDE 0.05 % MT LIQD
7.0000 mL | Freq: Two times a day (BID) | OROMUCOSAL | Status: DC
  Administered 2015-06-26 – 2015-07-07 (×22): 7 mL via OROMUCOSAL

## 2015-06-26 MED ORDER — INSULIN GLARGINE 100 UNIT/ML ~~LOC~~ SOLN
25.0000 [IU] | Freq: Every day | SUBCUTANEOUS | Status: DC
  Administered 2015-06-26 – 2015-07-03 (×8): 25 [IU] via SUBCUTANEOUS
  Filled 2015-06-26 (×8): qty 0.25

## 2015-06-26 MED ORDER — HALOPERIDOL LACTATE 5 MG/ML IJ SOLN
1.0000 mg | Freq: Once | INTRAMUSCULAR | Status: AC
  Administered 2015-06-26: 1 mg via INTRAMUSCULAR
  Filled 2015-06-26: qty 1

## 2015-06-26 MED ORDER — SODIUM CHLORIDE 0.9 % IV SOLN
2.0000 g | INTRAVENOUS | Status: DC
  Administered 2015-06-26: 2 g via INTRAVENOUS
  Filled 2015-06-26 (×5): qty 2000

## 2015-06-26 MED ORDER — MORPHINE SULFATE (PF) 2 MG/ML IV SOLN
1.0000 mg | INTRAVENOUS | Status: DC | PRN
  Administered 2015-06-27 – 2015-07-04 (×2): 2 mg via INTRAVENOUS
  Administered 2015-07-05: 1 mg via INTRAVENOUS
  Filled 2015-06-26 (×3): qty 1

## 2015-06-26 MED ORDER — LORAZEPAM 2 MG/ML IJ SOLN
0.5000 mg | Freq: Once | INTRAMUSCULAR | Status: AC | PRN
  Administered 2015-06-26: 0.5 mg via INTRAVENOUS
  Filled 2015-06-26: qty 1

## 2015-06-26 MED ORDER — CEFTRIAXONE SODIUM 2 G IJ SOLR
2.0000 g | Freq: Two times a day (BID) | INTRAMUSCULAR | Status: DC
  Administered 2015-06-26 – 2015-07-02 (×12): 2 g via INTRAVENOUS
  Filled 2015-06-26 (×15): qty 2

## 2015-06-26 MED ORDER — LIDOCAINE HCL (PF) 1 % IJ SOLN
INTRAMUSCULAR | Status: AC
  Filled 2015-06-26: qty 5

## 2015-06-26 NOTE — Progress Notes (Signed)
Inpatient Diabetes Program Recommendations  AACE/ADA: New Consensus Statement on Inpatient Glycemic Control (2015)  Target Ranges:  Prepandial:   less than 140 mg/dL      Peak postprandial:   less than 180 mg/dL (1-2 hours)      Critically ill patients:  140 - 180 mg/dL   Results for Valerie Wagner, Valerie Wagner (MRN [id number]) as of 06/26/2015 13:05  Ref. Range 06/25/2015 23:43 06/26/2015 04:09 06/26/2015 07:57 06/26/2015 12:40  Glucose-Capillary Latest Ref Range: 65-99 mg/dL 914267 (H) 782293 (H) 956265 (H) 231 (H)   Review of Glycemic Control  Diabetes history: DM 2 Outpatient Diabetes medications: Lantus 70 HS, Glipizide 10 mg Daily Current orders for Inpatient glycemic control: Novolog Resistant TID  Inpatient Diabetes Program Recommendations: Insulin - Basal: Glucose consistently in the 200's, patient takes lantus 70 units QHS at home, please consider ordering a low dose portion of the patients home insulin, Lantus 24 units Q24hrs.  Thanks,  Christena DeemShannon Kaydn Kumpf RN, MSN, [facility] Inpatient Diabetes Coordinator Team Pager [phone] (8a-5p)

## 2015-06-26 NOTE — Progress Notes (Signed)
Pt has periods of dramatic anxiety/agitation where she continuously tries to pull herself out of bed and is crying out (no words, just crying/moaning). Pt has yet to speak any words, make eye contact, or follow commands.

## 2015-06-26 NOTE — Progress Notes (Signed)
When pt is "awake/alert" she appears to be in a lot of pain but cannot communicate it. Pt rolls around in the bed crying and holding her head. Pt has yet to verbalize anything or follow commands. Fever continues, but it is down to 101.5 after 2nd dose of Tylenol at 0530.

## 2015-06-26 NOTE — Progress Notes (Signed)
Utilization Review Completed.Jovanie Verge T1/11/2015  

## 2015-06-26 NOTE — Progress Notes (Addendum)
RN tried to contact pt's son to get a signature for consent. The son did not answer. RN will keep trying to contact someone. Sanda LingerMilam, Derell Bruun R, RN  RN was able to contact son around 09:47 number in the chart is correct. Will continue to monitor the pt & to keep the family updated.

## 2015-06-26 NOTE — Progress Notes (Signed)
CRITICAL VALUE ALERT  Critical value received:  Lactic acid 2.3  Date of notification:  06/26/15  Time of notification:  0315  Critical value read back: Yes  Nurse who received alert:  Rosamaria LintsKelsey Theora Vankirk, RN  MD notified (1st page):  Craige CottaKirby  Time of first page:  [phone]

## 2015-06-26 NOTE — Progress Notes (Signed)
MD Allena KatzPatel notified of pt being unable to get procedures done d/t uncooperativeness. Will monitor pt & try to send her back down once she calms down some. Radiology notified of this as well.  Sanda LingerMilam, Conya Ellinwood R, RN

## 2015-06-26 NOTE — Progress Notes (Unsigned)
Patient ID: Valerie Wagner, female   DOB: [birth year], 65 y.o.   MRN: [id number]  The patient was brought down to radiology for lumbar puncture under fluoroscopy this morning.  I obtained phone consent from the patient's son.  When I arrived in the room, three technologists were engaged holding the patient down in order to prevent a fall.  The patient was agitated, pushing up, and not tolerating laying prone as is required for the procedure.  She had 0.5 mg Ativan prior to coming down.  Radiology nursing was called stat and we attempted to give another 0.5 mg Ativan but the patient was actively struggling and the IV would not push.   At this time I decided that the risk of injury to the patient and caregivers was too great to reasonably perform an invasive procedure such as lumbar puncture unless at least conscious sedation is employed and with adequate IV access that works.  The patient tolerates laying supine but does not tolerate the required prone position for fluoro guided LP.  Accordingly the patient was sent back to her room and I discussed with Dr. Allena KatzPatel.  We are happy to work with anesthesia for sedation to get the procedure performed, or interventional radiology may be able to perform under conscious sedation.  In my opinion patient is not currently a good candidate for LP without conscious sedation or full sedation.

## 2015-06-26 NOTE — Clinical Documentation Improvement (Signed)
Hospitalist Please update your documentation within the medical record to reflect your response to this query.  Thank you  Can the diagnosis of altered mental status be further specified?   Coma  Stupor/Semi-coma  Encephalopathy - Alcoholic, Anoxic/Hypoxia, Drug Induced/Toxic (specify drug), Hepatic, Hypertensive, Hypoglycemic, Metabolic/Septic, Traumatic/post concussive, Wernicke, Other  Other  Clinically Undetermined  Document any associated diagnoses/conditions.  Supporting Information: 06/25/15 H&P.Marland Kitchen.Marland Kitchen."Somnolence -Altered mental status secondary to sepsis..." 06/26/15 progr note.Marland Kitchen.Marland Kitchen."Neurology was consulted for AMS.".Marland Kitchen.Marland Kitchen."Neurology was consulted for AMS.She has not been able to get MRI, EEG or LP due to combativeness even after multiple attempts with Ativan.Marland Kitchen.Marland Kitchen.Marland Kitchen.Currently she is only responsive to pain and will not follow commands.".Marland Kitchen.Marland Kitchen.  Please exercise your independent, professional judgment when responding. A specific answer is not anticipated or expected.  Thank You,  Toribio Harbourphelia R Rudell Marlowe, RN, BSN, CCDS Certified Clinical Documentation Specialist [place]: Health Information Management [phone]

## 2015-06-26 NOTE — Progress Notes (Signed)
EEG attempted; pt kept rolling to her right side, putting hand up, and then trying to sit up. Nurse will call after patient has LP with sedation.

## 2015-06-26 NOTE — Progress Notes (Signed)
[facility] Progress Note  Patient: Valerie Wagner VOZ:[id number]   PCP: [name], [name], MD DOB: 1951/01/23   DOA: 06/25/2015   DOS: 06/26/2015   Date of Service: the patient was seen and examined on 06/26/2015  Subjective: Patient remains somnolent, awake on verbal command, follows command, remains nonverbal, improvement over her initial admission presentation Nutrition: Nothing by mouth at present Activity: Mostly bedridden Last BM: Prior to arrival  Assessment and Plan: 1. Severe sepsis (HCC) Suspected meningitis Acute encephalopathy Patient presented to [facility] with sudden onset of altered mental status. Patient husband found her on the floor not waking up. She did not have loss of pulse. EMS found her combative and she was given Versed. In the ER the patient was not responsive but was found to be having fever, lactic acidosis, leukocytosis. With this the patient was found to be having that area for sepsis suspecting CNS infection. Patient was started on broad-spectrum antibiotics. After discussing with IR and [facility] the patient was [facility] for further workup.  Patient remains significantly combative during the lumbar puncture today. Will require conscious sedation. Infectious diseases consulted, patient has not received any penicillin and remote history as per the family due to it being listed as allergy on her list. Discussed with the pharmacy and the patient will be started on meningitic doses protocol per pharmacy with ampicillin, ceftriaxone, as well as acyclovir. In the patient's medical condition does not improve today patient will require conscious sedation in the morning for workup with EEG, MRI, as well as lumbar puncture. Check ABG.  2. Acute encephalopathy. CT scan is unremarkable but it has been a motion degraded exam. Patient is opening her eyes on verbal cue following simple command as well. Primary mowing the right  hand. Consulted neurology for further input. Unable to complete any workup due to her agitation. TSH low free T4 normal, ammonia level minimally elevated, LFT fine. Checking ABG. VBG was within the acceptable range.  3. Type 2 diabetes mellitus. Due to patient's nothing by mouth status insulin Lantus will be initiated at a lower dose. Changing sliding scale to moderate. Every 4 hours.  4. Essential hypertension. Holding antihypertensive medications at present.  5. Chronic pain with polyneuropathy. Patient is nonverbal but has significant grimacing while she is mooved in the bed.  CT abdomen and pelvis did not show any acute abnormality. Patient is on chronic pain medication at home, will use when necessary morphine.  6. Severe obesity. Patient may have obesity hypoventilation syndrome. May benefit from outpatient sleep study. Checking ABG.  DVT Prophylaxis: subcutaneous Heparin Nutrition: Nothing by mouth at present, day 1 Advance goals of care discussion: Full code  Brief Summary of Hospitalization:  HPI: As per the H and P dictated on admission, "patient presented [facility] with an episode of unresponsiveness. Was not waking up on verbal command did not lose pulse." Daily update, Procedures: 06/26/2015 unable to complete fluoroscopy guided lumbar puncture, MRI after sedation, EEG. IV as well as neurology consulted. Consultants: Infectious disease, neurology Antibiotics: Anti-infectives    Start     Dose/Rate Route Frequency Ordered Stop   06/26/15 1600  ampicillin (OMNIPEN) 2 g in sodium chloride 0.9 % 50 mL IVPB     2 g 150 mL/hr over 20 Minutes Intravenous 6 times per day 06/26/15 1529     06/26/15 1530  vancomycin (VANCOCIN) IVPB 1000 mg/200 mL premix  Status:  Discontinued     1,000 mg 200 mL/hr  over 60 Minutes Intravenous  Once 06/26/15 1529 06/26/15 1533   06/26/15 1530  cefTRIAXone (ROCEPHIN) 2 g in dextrose 5 % 50 mL IVPB     2 g 100 mL/hr over 30  Minutes Intravenous Every 12 hours 06/26/15 1529     06/26/15 1000  levofloxacin (LEVAQUIN) IVPB 750 mg  Status:  Discontinued     750 mg 100 mL/hr over 90 Minutes Intravenous Every 24 hours 06/25/15 2052 06/26/15 1530   06/26/15 0200  vancomycin (VANCOCIN) IVPB 1000 mg/200 mL premix     1,000 mg 200 mL/hr over 60 Minutes Intravenous Every 12 hours 06/25/15 2052     06/25/15 2200  aztreonam (AZACTAM) 2 g in dextrose 5 % 50 mL IVPB  Status:  Discontinued     2 g 100 mL/hr over 30 Minutes Intravenous 3 times per day 06/25/15 2052 06/26/15 1530   06/25/15 2100  aztreonam (AZACTAM) 2 g in dextrose 5 % 50 mL IVPB  Status:  Discontinued     2 g 100 mL/hr over 30 Minutes Intravenous  Once 06/25/15 1625 06/25/15 2052   06/25/15 1430  acyclovir (ZOVIRAX) 600 mg in dextrose 5 % 100 mL IVPB     600 mg 112 mL/hr over 60 Minutes Intravenous  Once 06/25/15 1405 06/25/15 1601   06/25/15 1315  vancomycin (VANCOCIN) IVPB 1000 mg/200 mL premix    Comments:  ** Give 1000mg  IV x 2 bags total back to back to = total dose of 2000mg  **   1,000 mg 200 mL/hr over 60 Minutes Intravenous  Once 06/25/15 1115 06/25/15 1459   06/25/15 1215  vancomycin (VANCOCIN) IVPB 1000 mg/200 mL premix    Comments:  ** Give 1000mg  IV x 2 bags total back to back to = total dose of 2000mg  **   1,000 mg 200 mL/hr over 60 Minutes Intravenous  Once 06/25/15 1115 06/25/15 1403   06/25/15 1115  levofloxacin (LEVAQUIN) IVPB 750 mg     750 mg 100 mL/hr over 90 Minutes Intravenous  Once 06/25/15 1106 06/25/15 1336   06/25/15 1115  aztreonam (AZACTAM) 2 g in dextrose 5 % 50 mL IVPB     2 g 100 mL/hr over 30 Minutes Intravenous  Once 06/25/15 1106 06/25/15 1245   06/25/15 1115  vancomycin (VANCOCIN) IVPB 1000 mg/200 mL premix  Status:  Discontinued     1,000 mg 200 mL/hr over 60 Minutes Intravenous  Once 06/25/15 1106 06/25/15 1110       Family Communication: Discussed with the family on phone, at the time of interview.  Opportunity  was given to ask question and all questions were answered satisfactorily.   Disposition:  Barriers to safe discharge: Altered sensorium   Intake/Output Summary (Last 24 hours) at 06/26/15 1630 Last data filed at 06/26/15 1445  Gross per 24 hour  Intake   4200 ml  Output   4500 ml  Net   -300 ml   Filed Weights   06/25/15 1420 06/25/15 2104  Weight: 158.759 kg (350 lb) 125.7 kg (277 lb 1.9 oz)    Objective: Physical Exam: Filed Vitals:   06/26/15 0400 06/26/15 0500 06/26/15 0600 06/26/15 1242  BP: 160/82 171/78 167/80 166/77  Pulse: 80 74 51 86  Temp: 101.5 F (38.6 C) 101.5 F (38.6 C) 101.5 F (38.6 C)   TempSrc:      Resp: 24  26 22   Height:      Weight:      SpO2: 96% 97% 72% 98%  General: Appear in marked distress, no Rash; Oral Mucosa moist. Cardiovascular: S1 and S2 Present, no Murmur, no JVD Respiratory: Bilateral Air entry present and Clear to Auscultation, no Crackles, no wheezes Abdomen: Bowel Sound present, Soft and no tenderness Extremities: bilateral Pedal edema, no calf tenderness Neurology: Mental status nonverbal, follow command, Cranial Nerves PERRL,  Motor strength spontaneous movement of both extremities, right more than left Sensation present to painful stimuli reflexes  babinski unable to elicit  Data Reviewed: CBC:  Recent Labs Lab 06/25/15 1105 06/25/15 2306 06/26/15 1355  WBC 19.3* 16.4* 13.8*  NEUTROABS 16.7* 14.5* 11.3*  HGB 15.6* 12.7 12.0  HCT 45.1 39.3 36.2  MCV 90.7 90.1 89.8  PLT 319 254 229   Basic Metabolic Panel:  Recent Labs Lab 06/25/15 1105 06/25/15 2306 06/26/15 0420  NA 139 138 139  K 3.7 3.6 4.8  CL 101 104 108  CO2 25 20* 20*  GLUCOSE 351* 311* 282*  BUN 11 7 6   CREATININE 0.85 0.89 0.77  CALCIUM 10.0 8.7* 8.5*   Liver Function Tests:  Recent Labs Lab 06/25/15 1105 06/25/15 2306  AST 38 32  ALT 36 30  ALKPHOS 81 65  BILITOT 0.9 0.6  PROT 8.1 6.5  ALBUMIN 4.5 3.6   No results for  input(s): LIPASE, AMYLASE in the last 168 hours.  Recent Labs Lab 06/26/15 1355  AMMONIA 44*    Cardiac Enzymes:  Recent Labs Lab 06/25/15 1933  TROPONINI 0.03    BNP (last 3 results)  Recent Labs  06/26/15 1355  BNP 362.7*    CBG:  Recent Labs Lab 06/25/15 2343 06/26/15 0409 06/26/15 0757 06/26/15 1240  GLUCAP 267* 293* 265* 231*    Recent Results (from the past 240 hour(s))  Blood Culture (routine x 2)     Status: None (Preliminary result)   Collection Time: 06/25/15 11:05 AM  Result Value Ref Range Status   Specimen Description BLOOD LEFT ANTECUBITAL DRAWN BY RN  Final   Special Requests BOTTLES DRAWN AEROBIC AND ANAEROBIC 6CC EACH  Final   Culture NO GROWTH < 24 HOURS  Final   Report Status PENDING  Incomplete  Urine culture     Status: None (Preliminary result)   Collection Time: 06/25/15 11:06 AM  Result Value Ref Range Status   Specimen Description URINE, CATHETERIZED  Final   Special Requests NONE  Final   Culture   Final    NO GROWTH < 24 HOURS Performed at [facility]    Report Status PENDING  Incomplete  Blood Culture (routine x 2)     Status: None (Preliminary result)   Collection Time: 06/25/15 12:21 PM  Result Value Ref Range Status   Specimen Description BLOOD LEFT HAND  Final   Special Requests BOTTLES DRAWN AEROBIC ONLY 4CC  Final   Culture NO GROWTH < 12 HOURS  Final   Report Status PENDING  Incomplete  MRSA PCR Screening     Status: None   Collection Time: 06/25/15  9:08 PM  Result Value Ref Range Status   MRSA by PCR NEGATIVE NEGATIVE Final    Comment:        The GeneXpert MRSA Assay (FDA approved for NASAL specimens only), is one component of a comprehensive MRSA colonization surveillance program. It is not intended to diagnose MRSA infection nor to guide or monitor treatment for MRSA infections.   Culture, blood (x 2)     Status: None (Preliminary result)   Collection Time: 06/25/15 11:06 PM  Result Value Ref  Range Status   Specimen Description LEFT ANTECUBITAL  Final   Special Requests BOTTLES DRAWN AEROBIC ONLY 4CC  Final   Culture NO GROWTH < 12 HOURS  Final   Report Status PENDING  Incomplete  Culture, blood (x 2)     Status: None (Preliminary result)   Collection Time: 06/25/15 11:16 PM  Result Value Ref Range Status   Specimen Description RIGHT ANTECUBITAL  Final   Special Requests IN PEDIATRIC BOTTLE 3CC  Final   Culture NO GROWTH < 12 HOURS  Final   Report Status PENDING  Incomplete     Studies: Dg Chest Port 1 View  06/25/2015  CLINICAL DATA:  Sepsis.  Emesis immediately prior to the exam. EXAM: PORTABLE CHEST 1 VIEW COMPARISON:  Earlier this day at 1743 hour FINDINGS: Cardiomegaly and vascular congestion, unchanged allowing for differences in technique. No consolidation to suggest pneumonia. Unchanged elevation right hemidiaphragm. No pleural effusion or pneumothorax. IMPRESSION: Unchanged cardiomegaly and vascular congestion. No new abnormality is seen. Electronically Signed   By: Rubye Oaks M.D.   On: 06/25/2015 22:43   Dg Chest Port 1v Same Day  06/25/2015  CLINICAL DATA:  Patient was found unresponsive this morning. New onset of wheezing and vomiting today. EXAM: PORTABLE CHEST 1 VIEW COMPARISON:  January 5th 05/09/2016 1 a.m. FINDINGS: The heart size and mediastinal contours are stable. Heart size is enlarged. There is central pulmonary vascular congestion. There is no focal pneumonia or pleural effusion. The visualized skeletal structures are stable. IMPRESSION: Cardiomegaly and central pulmonary vascular congestion not changed. Electronically Signed   By: Sherian Rein M.D.   On: 06/25/2015 17:58     Scheduled Meds: . ampicillin (OMNIPEN) IV  2 g Intravenous 6 times per day  . antiseptic oral rinse  7 mL Mouth Rinse BID  . cefTRIAXone (ROCEPHIN)  IV  2 g Intravenous Q12H  . insulin aspart  0-15 Units Subcutaneous Q6H  . insulin glargine  25 Units Subcutaneous QHS  .  lidocaine (PF)      . LORazepam      . sodium chloride  3 mL Intravenous Q12H  . vancomycin  1,000 mg Intravenous Q12H   Continuous Infusions: . sodium chloride 100 mL/hr at 06/26/15 1330   PRN Meds: acetaminophen **OR** acetaminophen, haloperidol lactate, morphine injection, ondansetron **OR** ondansetron (ZOFRAN) IV  Time spent: 30 minutes  Author: Lynden Oxford, MD [facility] Pager: [phone] 06/26/2015 4:30 PM  If 7PM-7AM, please contact night-coverage at [email or web address], password [facility]

## 2015-06-26 NOTE — Consult Note (Signed)
NEURO HOSPITALIST CONSULT NOTE   Requestig physician: Dr. Allena Katz   Reason for Consult: AMS  HPI:                                                                                                                                          Valerie Wagner is an 65 y.o. female "with past medical history of type 2 diabetes mellitus, essential hypertension, chronic back pain and obesity. She was brought to the hospital by EMS for altered mental status and fever. She is unresponsive, the history was taken from EDP notes and husband at bedside, husband reported he found her on the floor, he tried to wake her up but she is unresponsive so he called 911. On route according to EMS she was combative and unresponsive so she was given 1 dose of Versed. In the ED temperature 102.9, WBC is 19.3 and lactic acid of 3.8. Code sepsis initiated, quick scan without contrast of head and abdomen/pelvis did not show acute abnormalities (both pictures motion degraded), patient responds to painful stimuli able to protect her airway. Unable to identify the reason for sepsis, patient needs LP, needs to be transferred to [facility] for that."  Neurology was consulted for AMS.  She has not been able to get MRI, EEG or LP due to combativeness even after multiple attempts with Ativan. She remains febrile 101.5 with WBC 16.4.  Currently she is only responsive to pain and will not follow commands.   Past Medical History  Diagnosis Date  . Chest pain     Reassuring workup of her time including negative cardiac catheterization and stress testing  . Type 2 diabetes mellitus   . Mixed hyperlipidemia   . Obesity   . Essential hypertension, benign   . COPD (chronic obstructive pulmonary disease)   . DJD (degenerative joint disease)   . Urinary incontinence   . GERD (gastroesophageal reflux disease)   . IBS (irritable bowel syndrome)   . Fibromyalgia   . Asthma   . Neuropathy     Past Surgical History   Procedure Laterality Date  . Tonsillectomy    . Hysterectomy -- unknown    . Cholecystectomy    . Carpal tunnel release      Right  . Left thumb surgery    . Cesarean section      Family History  Problem Relation Age of Onset  . Coronary artery disease Father   . Heart attack Father     Died age 77     Social History:  reports that she quit smoking about 33 years ago. Her smoking use included Cigarettes. She has a 2 pack-year smoking history. She has never used smokeless tobacco. She reports that she does not drink alcohol or use illicit drugs.  Allergies  Allergen Reactions  . Nsaids Shortness Of Breath  . Bee Venom   . Chocolate   . Darvon [Propoxyphene]   . Ivp Dye [Iodinated Diagnostic Agents] Swelling  . Penicillins Rash    MEDICATIONS:                                                                                                                     Prior to Admission:  Prescriptions prior to admission  Medication Sig Dispense Refill Last Dose  . albuterol (VENTOLIN HFA) 108 (90 BASE) MCG/ACT inhaler Inhale 2 puffs into the lungs every 6 (six) hours as needed for wheezing or shortness of breath.    06/24/2015 at Unknown time  . ALPRAZolam (XANAX) 0.5 MG tablet Take 0.5 mg by mouth 3 (three) times daily as needed for anxiety or sleep.    06/24/2015 at Unknown time  . benazepril-hydrochlorthiazide (LOTENSIN HCT) 20-12.5 MG per tablet Take 2 tablets by mouth daily.    06/24/2015 at Unknown time  . EPINEPHrine (EPIPEN JR) 0.15 MG/0.3ML injection Inject 0.15 mg into the muscle as needed for anaphylaxis.   unknown at Unknown time  . Fluticasone-Salmeterol (ADVAIR) 500-50 MCG/DOSE AEPB Inhale 1 puff into the lungs every 12 (twelve) hours.   06/24/2015 at Unknown time  . glipiZIDE (GLUCOTROL XL) 10 MG 24 hr tablet Take 10 mg by mouth daily with breakfast.   06/24/2015 at Unknown time  . HYDROcodone-acetaminophen (NORCO) 7.5-325 MG per tablet Take 1 tablet by mouth every 6 (six) hours as  needed for moderate pain.   06/24/2015 at Unknown time  . insulin glargine (LANTUS) 100 UNIT/ML injection Inject 70 Units into the skin at bedtime.    06/24/2015 at Unknown time  . ipratropium-albuterol (DUONEB) 0.5-2.5 (3) MG/3ML SOLN Take 3 mLs by nebulization every 4 (four) hours as needed (wheezing).    06/24/2015 at Unknown time  . nitroGLYCERIN (NITROSTAT) 0.4 MG SL tablet Place 0.4 mg under the tongue every 5 (five) minutes as needed for chest pain.    06/24/2015 at Unknown time  . Potassium Citrate (UROCIT-K 15) 15 MEQ (1620 MG) TBCR Take 1 tablet by mouth 2 (two) times daily.   06/24/2015 at Unknown time  . pregabalin (LYRICA) 50 MG capsule Take 50 mg by mouth 2 (two) times daily.   06/24/2015 at Unknown time  . tiotropium (SPIRIVA) 18 MCG inhalation capsule Place 18 mcg into inhaler and inhale daily.   06/24/2015 at Unknown time   Scheduled: . antiseptic oral rinse  7 mL Mouth Rinse BID  . aztreonam  2 g Intravenous 3 times per day  . insulin aspart  0-20 Units Subcutaneous TID WC  . levofloxacin (LEVAQUIN) IV  750 mg Intravenous Q24H  . lidocaine (PF)      . LORazepam      . sodium chloride  3 mL Intravenous Q12H  . vancomycin  1,000 mg Intravenous Q12H     ROS:  History unable to be obtained from the patient, due to AMS.    Blood pressure 166/77, pulse 86, temperature 101.5 F (38.6 C), temperature source Core (Comment), resp. rate 22, height 5\' 4"  (1.626 m), weight 125.7 kg (277 lb 1.9 oz), SpO2 98 %.   Neurologic Examination:                                                                                                        Neurological Examination , limited exam due to AMS Mental Status: Awakens to tactile stimuli and withdraws from pain but does not verbalize or follow commands.  Cranial Nerves: II:   blinks to threat bilaterally, pupils equal,  round, reactive to light  III,IV, VI: ptosis not present, no gaze deviation or preference. Dolls +.  VII: face grimace symmetric,   Motor / sensory: Withdraws symmetrically in all extremities.  Sensory: Pinprick and light touch intact throughout, bilaterally Deep Tendon Reflexes: 1+ and symmetric throughout Plantars: Right: downgoing   Left: downgoing   Lab Results: Basic Metabolic Panel:  Recent Labs Lab 06/25/15 1105 06/25/15 2306 06/26/15 0420  NA 139 138 139  K 3.7 3.6 4.8  CL 101 104 108  CO2 25 20* 20*  GLUCOSE 351* 311* 282*  BUN 11 7 6   CREATININE 0.85 0.89 0.77  CALCIUM 10.0 8.7* 8.5*    Liver Function Tests:  Recent Labs Lab 06/25/15 1105 06/25/15 2306  AST 38 32  ALT 36 30  ALKPHOS 81 65  BILITOT 0.9 0.6  PROT 8.1 6.5  ALBUMIN 4.5 3.6   No results for input(s): LIPASE, AMYLASE in the last 168 hours. No results for input(s): AMMONIA in the last 168 hours.  CBC:  Recent Labs Lab 06/25/15 1105 06/25/15 2306  WBC 19.3* 16.4*  NEUTROABS 16.7* 14.5*  HGB 15.6* 12.7  HCT 45.1 39.3  MCV 90.7 90.1  PLT 319 254    Cardiac Enzymes:  Recent Labs Lab 06/25/15 1933  TROPONINI 0.03    Lipid Panel: No results for input(s): CHOL, TRIG, HDL, CHOLHDL, VLDL, LDLCALC in the last 168 hours.  CBG:  Recent Labs Lab 06/25/15 2343 06/26/15 0409 06/26/15 0757 06/26/15 1240  GLUCAP 267* 293* 265* 231*    Microbiology: Results for orders placed or performed during the hospital encounter of 06/25/15  Blood Culture (routine x 2)     Status: None (Preliminary result)   Collection Time: 06/25/15 11:05 AM  Result Value Ref Range Status   Specimen Description BLOOD LEFT ANTECUBITAL DRAWN BY RN  Final   Special Requests BOTTLES DRAWN AEROBIC AND ANAEROBIC 6CC EACH  Final   Culture NO GROWTH < 24 HOURS  Final   Report Status PENDING  Incomplete  Urine culture     Status: None (Preliminary result)   Collection Time: 06/25/15 11:06 AM  Result Value Ref  Range Status   Specimen Description URINE, CATHETERIZED  Final   Special Requests NONE  Final   Culture   Final    NO GROWTH < 24 HOURS [facility]    Report  Status PENDING  Incomplete  Blood Culture (routine x 2)     Status: None (Preliminary result)   Collection Time: 06/25/15 12:21 PM  Result Value Ref Range Status   Specimen Description BLOOD LEFT HAND  Final   Special Requests BOTTLES DRAWN AEROBIC ONLY 4CC  Final   Culture NO GROWTH < 12 HOURS  Final   Report Status PENDING  Incomplete  MRSA PCR Screening     Status: None   Collection Time: 06/25/15  9:08 PM  Result Value Ref Range Status   MRSA by PCR NEGATIVE NEGATIVE Final    Comment:        The GeneXpert MRSA Assay (FDA approved for NASAL specimens only), is one component of a comprehensive MRSA colonization surveillance program. It is not intended to diagnose MRSA infection nor to guide or monitor treatment for MRSA infections.   Culture, blood (x 2)     Status: None (Preliminary result)   Collection Time: 06/25/15 11:06 PM  Result Value Ref Range Status   Specimen Description LEFT ANTECUBITAL  Final   Special Requests BOTTLES DRAWN AEROBIC ONLY 4CC  Final   Culture NO GROWTH < 12 HOURS  Final   Report Status PENDING  Incomplete  Culture, blood (x 2)     Status: None (Preliminary result)   Collection Time: 06/25/15 11:16 PM  Result Value Ref Range Status   Specimen Description RIGHT ANTECUBITAL  Final   Special Requests IN PEDIATRIC BOTTLE 3CC  Final   Culture NO GROWTH < 12 HOURS  Final   Report Status PENDING  Incomplete    Coagulation Studies:  Recent Labs  06/25/15 2306  LABPROT 15.8*  INR 1.24    Imaging: Ct Abdomen Pelvis Wo Contrast  06/25/2015  CLINICAL DATA:  Found on the floor unresponsive at 10 a.m. EXAM: CT ABDOMEN AND PELVIS WITHOUT CONTRAST TECHNIQUE: Multidetector CT imaging of the abdomen and pelvis was performed following the standard protocol without IV  contrast. COMPARISON:  10/14/2013, 05/20/2014 FINDINGS: Lower chest:  Clear lung bases.  Normal heart size. Hepatobiliary: Normal liver.  Prior cholecystectomy. Pancreas: Normal. Spleen: Normal. Adrenals/Urinary Tract: Normal adrenal glands. 12 mm exophytic hypodense, fluid attenuating right renal mass most consistent with a cyst. No urolithiasis or obstructive uropathy. Decompressed bladder with a Foley catheter present. Stomach/Bowel: No bowel wall thickening or dilatation. Small hiatal hernia. No pneumatosis, pneumoperitoneum or portal venous gas. No abdominal pelvic free fluid. Vascular/Lymphatic: Normal caliber abdominal aorta with mild atherosclerosis. No lymphadenopathy. Reproductive: No adnexal mass.  Prior hysterectomy. Musculoskeletal: No acute osseous abnormality. No lytic or sclerotic osseous lesion. There is a 2.3 cm hypodense, fluid attenuating partially visualized mass in the left adductor brevis muscle concerning for a liquified hematoma versus ganglion cyst, but this is incompletely characterized. IMPRESSION: 1. No acute abdominal or pelvic pathology. 2. 2.3 cm hypodense, fluid attenuating partially visualized mass in the left adductor brevis muscle concerning for a liquified hematoma versus ganglion cyst, but this is incompletely characterized. This is unchanged compared with 05/20/2014. Electronically Signed   By: Elige Ko   On: 06/25/2015 12:25   Ct Head Wo Contrast  06/25/2015  CLINICAL DATA:  Found on floor.  Altered mental status EXAM: CT HEAD WITHOUT CONTRAST TECHNIQUE: Contiguous axial images were obtained from the base of the skull through the vertex without intravenous contrast. COMPARISON:  MRI 01/22/2015 FINDINGS: Image quality degraded by significant motion. This study was repeated however there is persistent motion on the repeat exam Ventricle size normal. Negative for  acute infarct. Negative for acute hemorrhage or mass. Subtle findings could be missed on the study given the  amount of motion. IMPRESSION: Image quality degraded by significant motion. No acute abnormality. Repeat study recommended if symptoms persist. Electronically Signed   By: Marlan Palau M.D.   On: 06/25/2015 12:24   Dg Chest Port 1 View  06/25/2015  CLINICAL DATA:  Sepsis.  Emesis immediately prior to the exam. EXAM: PORTABLE CHEST 1 VIEW COMPARISON:  Earlier this day at 1743 hour FINDINGS: Cardiomegaly and vascular congestion, unchanged allowing for differences in technique. No consolidation to suggest pneumonia. Unchanged elevation right hemidiaphragm. No pleural effusion or pneumothorax. IMPRESSION: Unchanged cardiomegaly and vascular congestion. No new abnormality is seen. Electronically Signed   By: Rubye Oaks M.D.   On: 06/25/2015 22:43   Dg Chest Port 1 View  06/25/2015  CLINICAL DATA:  Altered mental status. EXAM: PORTABLE CHEST 1 VIEW COMPARISON:  01/21/2015 FINDINGS: Cardiac enlargement with vascular congestion. Mild right lower lobe atelectasis. No effusion. IMPRESSION: Cardiac enlargement with vascular congestion Mild right lower lobe atelectasis. Electronically Signed   By: Marlan Palau M.D.   On: 06/25/2015 11:44   Dg Chest Port 1v Same Day  06/25/2015  CLINICAL DATA:  Patient was found unresponsive this morning. New onset of wheezing and vomiting today. EXAM: PORTABLE CHEST 1 VIEW COMPARISON:  January 5th 05/09/2016 1 a.m. FINDINGS: The heart size and mediastinal contours are stable. Heart size is enlarged. There is central pulmonary vascular congestion. There is no focal pneumonia or pleural effusion. The visualized skeletal structures are stable. IMPRESSION: Cardiomegaly and central pulmonary vascular congestion not changed. Electronically Signed   By: Sherian Rein M.D.   On: 06/25/2015 17:58     Assessment/Plan: 65 YO female with AMS in setting of leucocytosis and fever, suggestive of acute toxic metabolic encephalopathy, of unknown etiology. UA is negative and Bl Cx pending.  Initial CT is motion degraded and MRI has not been possible due to agitation. LP attempted but not able to be obtained due to agitation. EEG attempted but not able to be obtained due to agitation. Currently on Azactam, Levaquin and Vancomycin.   Recommend: 1) Changing Levaquin to none floroquinalone if possible, FQ are associated with worsening encephalopathy in general.  2) Haldol /ativan prn for agitation.  3) Will need LP, EEG and MRI brain for further w/u.  4) Continue iv Acyclovir until we can rule out CNS HSV infection with LP. Covered with empiric antibiotics.  5)  ID consult  Will f/u.

## 2015-06-26 NOTE — Progress Notes (Addendum)
CRITICAL VALUE ALERT  Critical value received:  Lactic acid 2.2  Date of notification:  06/26/15  Time of notification:  0010  Critical value read back: Yes  Nurse who received alert:  Rosamaria LintsKelsey Erol Flanagin, RN  MD notified (1st page):  Craige CottaKirby  Time of first page:  0012 - lab result trending down, orders in place

## 2015-06-26 NOTE — Consult Note (Addendum)
[facility]    Date of Admission:  06/25/2015            Day 1 ampicillin        Day 1 ceftriaxone        Day 1 vancomycin       Reason for Consult: Acute fever, sepsis and encephalopathy    Referring Physician: Dr. Lynden Oxford  Principal Problem:   Acute encephalopathy Active Problems:   Sepsis (HCC)   Fever   Essential hypertension, benign   Severe sepsis (HCC)   Somnolence   Chronic pain   Type 2 diabetes mellitus (HCC)   Polyneuropathy (HCC)   Severe obesity (BMI >= 40) (HCC)   Altered mental state   Type 2 diabetes mellitus without complication, without long-term current use of insulin (HCC)   . ampicillin (OMNIPEN) IV  2 g Intravenous 6 times per day  . antiseptic oral rinse  7 mL Mouth Rinse BID  . cefTRIAXone (ROCEPHIN)  IV  2 g Intravenous Q12H  . insulin aspart  0-15 Units Subcutaneous Q6H  . insulin glargine  25 Units Subcutaneous QHS  . lidocaine (PF)      . LORazepam      . sodium chloride  3 mL Intravenous Q12H  . vancomycin  1,000 mg Intravenous Q12H    Recommendations: 1. Continue vancomycin and ceftriaxone 2. Discontinue ampicillin 3. Start acyclovir 4. Droplet precautions can be discontinued now that she has been on antibiotics for 24 hours 5. Await results of blood cultures 6. Lumbar puncture as soon as it is safe to perform   Assessment: I am certainly concerned about meningoencephalitis given the history provided by her son and her clinical presentation. Initial blood cultures remain negative and there is no obvious evidence of pneumonia, intra-abdominal infection or UTI. Her urine drug screen is positive for opiates and benzodiazepines but this is not a simple drug overdose. Obviously it would be best to obtain a lumbar puncture as soon as it is safe to do. I would cover her with vancomycin, ceftriaxone and acyclovir for now. Listeria infection with acute meningitis is extremely unlikely and I will stop  ampicillin now. Dr. Judyann Munson will follow with you this weekend.   HPI: Valerie Wagner is a 65 y.o. female with a history of obesity, diabetes and chronic pain. Yesterday morning around 9 AM she was found by her husband down on the floor wedged between her bed and nightstand. She was poorly responsive but agitated. EMS was called and she was taken to [facility] where she was found to be febrile up to 102 and hypotensive. She was transferred here. Lumbar puncture has not been possible because of her agitation. I was called to see her because of the concerns about meningoencephalitis.  No family was present during my examination. I attempted to reach her husband, Mendel Ryder, at his listed number but it is disconnected. I spoke with her son, Gwenneth Whiteman, by phone. He told me that each she had not been feeling well for the past month. She has had recurrent sores that he describes as boils. He was concerned that she had some type of staph infection. She recently began to have headaches. There was some concern that she might have had some confusion. She had trouble finding some of her medications over the past several days. The night before last she said that she had a severe headache but did not  want to go to the hospital. She also got very mad at her husband when he came in the house that night. She asked him where he had been although she would normally know that he was coming home from church at that time. They saw her yesterday morning about 7 AM when she got up to go to the bathroom. She did not say anything to them and went back to bed. The next time she was seen she was down on the floor around 9 AM yesterday.   Review of Systems: Review of Systems  Unable to perform ROS   Past Medical History  Diagnosis Date  . Chest pain     Reassuring workup of her time including negative cardiac catheterization and stress testing  . Type 2 diabetes mellitus   . Mixed hyperlipidemia   . Obesity    . Essential hypertension, benign   . COPD (chronic obstructive pulmonary disease)   . DJD (degenerative joint disease)   . Urinary incontinence   . GERD (gastroesophageal reflux disease)   . IBS (irritable bowel syndrome)   . Fibromyalgia   . Asthma   . Neuropathy     Social History  Substance Use Topics  . Smoking status: Former Smoker -- 1.00 packs/day for 2 years    Types: Cigarettes    Quit date: 06/20/1982  . Smokeless tobacco: Never Used  . Alcohol Use: No    Family History  Problem Relation Age of Onset  . Coronary artery disease Father   . Heart attack Father     Died age 63   Allergies  Allergen Reactions  . Nsaids Shortness Of Breath  . Bee Venom   . Chocolate   . Darvon [Propoxyphene]   . Ivp Dye [Iodinated Diagnostic Agents] Swelling  . Doxycycline Rash  . Penicillins Rash    OBJECTIVE: Blood pressure 166/77, pulse 86, temperature 101.5 F (38.6 C), temperature source Core (Comment), resp. rate 22, height 5\' 4"  (1.626 m), weight 277 lb 1.9 oz (125.7 kg), SpO2 98 %.  Physical Exam  Constitutional:  She is obese.  Eyes: Conjunctivae are normal. Pupils are equal, round, and reactive to light.  Neck:  She has some neck stiffness with flexion.  Cardiovascular: Regular rhythm.   No murmur heard. She is tachycardic.  Pulmonary/Chest: Breath sounds normal.  Abdominal: Soft. Bowel sounds are normal. She exhibits no mass. There is no tenderness.  She has a healed right upper quadrant scar from prior cholecystectomy.  Musculoskeletal: Normal range of motion.  Neurological:  She is breathing loudly. She does not respond to commands or sternal rub.  Skin: No rash noted.  She has one small healing excoriation on her left forearm. I do not see any obvious boils or other skin infection.    Lab Results Lab Results  Component Value Date   WBC 13.8* 06/26/2015   HGB 12.0 06/26/2015   HCT 36.2 06/26/2015   MCV 89.8 06/26/2015   PLT 229 06/26/2015    Lab  Results  Component Value Date   CREATININE 0.77 06/26/2015   BUN 6 06/26/2015   NA 139 06/26/2015   K 4.8 06/26/2015   CL 108 06/26/2015   CO2 20* 06/26/2015    Lab Results  Component Value Date   ALT 30 06/25/2015   AST 32 06/25/2015   ALKPHOS 65 06/25/2015   BILITOT 0.6 06/25/2015     Microbiology: Recent Results (from the past 240 hour(s))  Blood Culture (routine x 2)  Status: None (Preliminary result)   Collection Time: 06/25/15 11:05 AM  Result Value Ref Range Status   Specimen Description BLOOD LEFT ANTECUBITAL DRAWN BY RN  Final   Special Requests BOTTLES DRAWN AEROBIC AND ANAEROBIC 6CC EACH  Final   Culture NO GROWTH < 24 HOURS  Final   Report Status PENDING  Incomplete  Urine culture     Status: None (Preliminary result)   Collection Time: 06/25/15 11:06 AM  Result Value Ref Range Status   Specimen Description URINE, CATHETERIZED  Final   Special Requests NONE  Final   Culture   Final    NO GROWTH < 24 HOURS Performed at [facility]    Report Status PENDING  Incomplete  Blood Culture (routine x 2)     Status: None (Preliminary result)   Collection Time: 06/25/15 12:21 PM  Result Value Ref Range Status   Specimen Description BLOOD LEFT HAND  Final   Special Requests BOTTLES DRAWN AEROBIC ONLY 4CC  Final   Culture NO GROWTH < 12 HOURS  Final   Report Status PENDING  Incomplete  MRSA PCR Screening     Status: None   Collection Time: 06/25/15  9:08 PM  Result Value Ref Range Status   MRSA by PCR NEGATIVE NEGATIVE Final    Comment:        The GeneXpert MRSA Assay (FDA approved for NASAL specimens only), is one component of a comprehensive MRSA colonization surveillance program. It is not intended to diagnose MRSA infection nor to guide or monitor treatment for MRSA infections.   Culture, blood (x 2)     Status: None (Preliminary result)   Collection Time: 06/25/15 11:06 PM  Result Value Ref Range Status   Specimen Description LEFT  ANTECUBITAL  Final   Special Requests BOTTLES DRAWN AEROBIC ONLY 4CC  Final   Culture NO GROWTH < 12 HOURS  Final   Report Status PENDING  Incomplete  Culture, blood (x 2)     Status: None (Preliminary result)   Collection Time: 06/25/15 11:16 PM  Result Value Ref Range Status   Specimen Description RIGHT ANTECUBITAL  Final   Special Requests IN PEDIATRIC BOTTLE 3CC  Final   Culture NO GROWTH < 12 HOURS  Final   Report Status PENDING  Incomplete    Cliffton Asters, MD [facility] [phone] pager   [phone] cell 06/26/2015, 4:45 PM

## 2015-06-27 ENCOUNTER — Inpatient Hospital Stay (HOSPITAL_COMMUNITY): Payer: Medicaid Other

## 2015-06-27 DIAGNOSIS — E1142 Type 2 diabetes mellitus with diabetic polyneuropathy: Secondary | ICD-10-CM

## 2015-06-27 DIAGNOSIS — E669 Obesity, unspecified: Secondary | ICD-10-CM

## 2015-06-27 DIAGNOSIS — K118 Other diseases of salivary glands: Secondary | ICD-10-CM

## 2015-06-27 LAB — BASIC METABOLIC PANEL
ANION GAP: 12 (ref 5–15)
BUN: <5 mg/dL — ABNORMAL LOW (ref 6–20)
CO2: 23 mmol/L (ref 22–32)
Calcium: 8.8 mg/dL — ABNORMAL LOW (ref 8.9–10.3)
Chloride: 102 mmol/L (ref 101–111)
Creatinine, Ser: 0.68 mg/dL (ref 0.44–1.00)
Glucose, Bld: 264 mg/dL — ABNORMAL HIGH (ref 65–99)
POTASSIUM: 3.7 mmol/L (ref 3.5–5.1)
SODIUM: 137 mmol/L (ref 135–145)

## 2015-06-27 LAB — GLUCOSE, CAPILLARY
GLUCOSE-CAPILLARY: 229 mg/dL — AB (ref 65–99)
GLUCOSE-CAPILLARY: 221 mg/dL — AB (ref 65–99)
GLUCOSE-CAPILLARY: 219 mg/dL — AB (ref 65–99)
GLUCOSE-CAPILLARY: 247 mg/dL — AB (ref 65–99)
GLUCOSE-CAPILLARY: 208 mg/dL — AB (ref 65–99)
GLUCOSE-CAPILLARY: 182 mg/dL — AB (ref 65–99)
Glucose-Capillary: 215 mg/dL — ABNORMAL HIGH (ref 65–99)

## 2015-06-27 LAB — HEMOGLOBIN A1C
HEMOGLOBIN A1C: 10.7 % — AB (ref 4.8–5.6)
Mean Plasma Glucose: 260 mg/dL

## 2015-06-27 LAB — CBC
HEMATOCRIT: 39.9 % (ref 36.0–46.0)
HEMOGLOBIN: 13.4 g/dL (ref 12.0–15.0)
MCH: 30.3 pg (ref 26.0–34.0)
MCHC: 33.6 g/dL (ref 30.0–36.0)
MCV: 90.3 fL (ref 78.0–100.0)
Platelets: 220 10*3/uL (ref 150–400)
RBC: 4.42 MIL/uL (ref 3.87–5.11)
RDW: 12.1 % (ref 11.5–15.5)
WBC: 15.7 10*3/uL — AB (ref 4.0–10.5)

## 2015-06-27 MED ORDER — LORAZEPAM 2 MG/ML IJ SOLN
1.0000 mg | Freq: Once | INTRAMUSCULAR | Status: AC
  Administered 2015-06-27: 1 mg via INTRAVENOUS
  Filled 2015-06-27: qty 1

## 2015-06-27 MED ORDER — LORAZEPAM 2 MG/ML IJ SOLN
1.0000 mg | Freq: Once | INTRAMUSCULAR | Status: AC | PRN
  Administered 2015-06-27: 1 mg via INTRAVENOUS
  Filled 2015-06-27: qty 1

## 2015-06-27 MED ORDER — INSULIN ASPART 100 UNIT/ML ~~LOC~~ SOLN
0.0000 [IU] | SUBCUTANEOUS | Status: DC
  Administered 2015-06-27 – 2015-06-28 (×6): 5 [IU] via SUBCUTANEOUS
  Administered 2015-06-28 – 2015-06-29 (×3): 3 [IU] via SUBCUTANEOUS
  Administered 2015-06-29 – 2015-06-30 (×5): 2 [IU] via SUBCUTANEOUS
  Administered 2015-06-30: 3 [IU] via SUBCUTANEOUS
  Administered 2015-06-30: 2 [IU] via SUBCUTANEOUS
  Administered 2015-06-30 – 2015-07-01 (×2): 3 [IU] via SUBCUTANEOUS
  Administered 2015-07-01: 2 [IU] via SUBCUTANEOUS
  Administered 2015-07-01 (×3): 3 [IU] via SUBCUTANEOUS
  Administered 2015-07-01 – 2015-07-02 (×3): 2 [IU] via SUBCUTANEOUS
  Administered 2015-07-02: 5 [IU] via SUBCUTANEOUS
  Administered 2015-07-02: 3 [IU] via SUBCUTANEOUS
  Administered 2015-07-02 (×2): 2 [IU] via SUBCUTANEOUS
  Administered 2015-07-02: 3 [IU] via SUBCUTANEOUS

## 2015-06-27 NOTE — Progress Notes (Signed)
[facility] Progress Note  Patient: Valerie Wagner YNW:[id number]   PCP: Donzetta Sprung, MD DOB: 08-25-1950   DOA: 06/25/2015   DOS: 06/27/2015   Date of Service: the patient was seen and examined on 06/27/2015  Subjective: Fever improving, patient is able to verbalize her name today. Denies having any acute pain. More comfortable today and no agitation reported this morning. Nutrition: Nothing by mouth at present Activity: Mostly bedridden Last BM: 06/26/2015  Assessment and Plan: 1. 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 Patient husband found her on the floor not waking up. She did not have loss of pulse. EMS found her combative and she was given Versed. In the ER the patient was not responsive but was found to be having fever, lactic acidosis, leukocytosis. With this the patient was found to be meeting criteria for sepsis suspecting CNS infection. Patient was started on broad-spectrum antibiotics. After discussing with IR and [facility] the patient was transferred to [facility] for further workup.  Infectious disease is consulted and the patient is currently on vancomycin and ceftriaxone, acyclovir. Reordered lumbar puncture today. Discussed with radiology. Patient appears to be more calm and comfortable and lumbar puncture will add and narrowing down the antibiotic. Highly appreciated radiology input.  2. Acute encephalopathy. CT scan is unremarkable but it has been a motion degraded exam. MRI also unremarkable. Patient is opening her eyes on verbal cue following simple command as well. A shin is able to verbalize her name today and follow complex command.  ABG no hypercarbia, lab data parameters within acceptable range. Neurology following. Attempt EEG today.  3. Type 2 diabetes mellitus. Due to patient's nothing by mouth status insulin Lantus will be initiated at a lower  dose. Changing sliding scale to moderate.Every 4 hours.  4. Essential hypertension. Holding antihypertensive medications at present.  5. Chronic pain with polyneuropathy. Patient is on chronic pain medication at home, will use when necessary morphine.  6. Severe obesity. Patient may have obesity hypoventilation syndrome. May benefit from outpatient sleep study. ABG shows no hypercarbia  DVT Prophylaxis: subcutaneous Heparin Nutrition: Nothing by mouth at present, day 2  Advance goals of care discussion: Full code  Brief Summary of Hospitalization:  HPI: As per the H and P dictated on admission, "patient presented at [facility] with an episode of unresponsiveness. Was not waking up on verbal command did not lose pulse." Daily update, Procedures: 06/26/2015 unable to complete fluoroscopy guided lumbar puncture, MRI after sedation, EEG., 06/26/2025 MRI negative, lumbar puncture re ordered Consultants: Infectious disease, neurology, intervention radiology Antibiotics: Anti-infectives    Start     Dose/Rate Route Frequency Ordered Stop   06/26/15 1730  acyclovir (ZOVIRAX) 600 mg in dextrose 5 % 100 mL IVPB     600 mg 112 mL/hr over 60 Minutes Intravenous 3 times per day 06/26/15 1717     06/26/15 1600  ampicillin (OMNIPEN) 2 g in sodium chloride 0.9 % 50 mL IVPB  Status:  Discontinued     2 g 150 mL/hr over 20 Minutes Intravenous 6 times per day 06/26/15 1529 06/26/15 1658   06/26/15 1530  vancomycin (VANCOCIN) IVPB 1000 mg/200 mL premix  Status:  Discontinued     1,000 mg 200 mL/hr over 60 Minutes Intravenous  Once 06/26/15 1529 06/26/15 1533   06/26/15 1530  cefTRIAXone (ROCEPHIN) 2 g in dextrose 5 % 50 mL IVPB     2 g 100  mL/hr over 30 Minutes Intravenous Every 12 hours 06/26/15 1529     06/26/15 1000  levofloxacin (LEVAQUIN) IVPB 750 mg  Status:  Discontinued     750 mg 100 mL/hr over 90 Minutes Intravenous Every 24 hours 06/25/15 2052 06/26/15 1530   06/26/15 0200   vancomycin (VANCOCIN) IVPB 1000 mg/200 mL premix     1,000 mg 200 mL/hr over 60 Minutes Intravenous Every 12 hours 06/25/15 2052     06/25/15 2200  aztreonam (AZACTAM) 2 g in dextrose 5 % 50 mL IVPB  Status:  Discontinued     2 g 100 mL/hr over 30 Minutes Intravenous 3 times per day 06/25/15 2052 06/26/15 1530   06/25/15 2100  aztreonam (AZACTAM) 2 g in dextrose 5 % 50 mL IVPB  Status:  Discontinued     2 g 100 mL/hr over 30 Minutes Intravenous  Once 06/25/15 1625 06/25/15 2052   06/25/15 1430  acyclovir (ZOVIRAX) 600 mg in dextrose 5 % 100 mL IVPB     600 mg 112 mL/hr over 60 Minutes Intravenous  Once 06/25/15 1405 06/25/15 1601   06/25/15 1315  vancomycin (VANCOCIN) IVPB 1000 mg/200 mL premix    Comments:  ** Give 1000mg  IV x 2 bags total back to back to = total dose of 2000mg  **   1,000 mg 200 mL/hr over 60 Minutes Intravenous  Once 06/25/15 1115 06/25/15 1459   06/25/15 1215  vancomycin (VANCOCIN) IVPB 1000 mg/200 mL premix    Comments:  ** Give 1000mg  IV x 2 bags total back to back to = total dose of 2000mg  **   1,000 mg 200 mL/hr over 60 Minutes Intravenous  Once 06/25/15 1115 06/25/15 1403   06/25/15 1115  levofloxacin (LEVAQUIN) IVPB 750 mg     750 mg 100 mL/hr over 90 Minutes Intravenous  Once 06/25/15 1106 06/25/15 1336   06/25/15 1115  aztreonam (AZACTAM) 2 g in dextrose 5 % 50 mL IVPB     2 g 100 mL/hr over 30 Minutes Intravenous  Once 06/25/15 1106 06/25/15 1245   06/25/15 1115  vancomycin (VANCOCIN) IVPB 1000 mg/200 mL premix  Status:  Discontinued     1,000 mg 200 mL/hr over 60 Minutes Intravenous  Once 06/25/15 1106 06/25/15 1110      Family Communication: no family at bedside at the time of interview.   Disposition:  Barriers to safe discharge: Altered sensorium   Intake/Output Summary (Last 24 hours) at 06/27/15 1346 Last data filed at 06/27/15 0927  Gross per 24 hour  Intake 3180.33 ml  Output   2600 ml  Net 580.33 ml   Filed Weights   06/25/15 1420  06/25/15 2104  Weight: 158.759 kg (350 lb) 125.7 kg (277 lb 1.9 oz)    Objective: Physical Exam: Filed Vitals:   06/27/15 0400 06/27/15 0500 06/27/15 0805 06/27/15 1228  BP: 158/73 151/74 142/69 152/64  Pulse: 79 75 83 81  Temp: 101.8 F (38.8 C) 101.5 F (38.6 C) 100.9 F (38.3 C) 100.4 F (38 C)  TempSrc: Core (Comment)  Core (Comment) Core (Comment)  Resp: 20 26 17 16   Height:      Weight:      SpO2: 97% 99% 97% 97%     General: Appear in mild distress, no Rash; Oral Mucosa moist. Cardiovascular: S1 and S2 Present, no Murmur, no JVD Respiratory: Bilateral Air entry present and Clear to Auscultation, no Crackles, no wheezes Abdomen: Bowel Sound present, Soft and no tenderness Extremities: bilateral Pedal edema,  no calf tenderness Neurology: Mental status verbalizing her name and needs, follow command, Cranial Nerves PERRL,  Motor strength spontaneous movement of both extremities, right more than left Sensation present to painful stimuli reflexes  babinski unable to elicit  Data Reviewed: CBC:  Recent Labs Lab 06/25/15 1105 06/25/15 2306 06/26/15 1355 06/27/15 0340  WBC 19.3* 16.4* 13.8* 15.7*  NEUTROABS 16.7* 14.5* 11.3*  --   HGB 15.6* 12.7 12.0 13.4  HCT 45.1 39.3 36.2 39.9  MCV 90.7 90.1 89.8 90.3  PLT 319 254 229 220   Basic Metabolic Panel:  Recent Labs Lab 06/25/15 1105 06/25/15 2306 06/26/15 0420 06/27/15 0340  NA 139 138 139 137  K 3.7 3.6 4.8 3.7  CL 101 104 108 102  CO2 25 20* 20* 23  GLUCOSE 351* 311* 282* 264*  BUN 11 7 6  <5*  CREATININE 0.85 0.89 0.77 0.68  CALCIUM 10.0 8.7* 8.5* 8.8*   Liver Function Tests:  Recent Labs Lab 06/25/15 1105 06/25/15 2306  AST 38 32  ALT 36 30  ALKPHOS 81 65  BILITOT 0.9 0.6  PROT 8.1 6.5  ALBUMIN 4.5 3.6   No results for input(s): LIPASE, AMYLASE in the last 168 hours.  Recent Labs Lab 06/26/15 1355  AMMONIA 44*    Cardiac Enzymes:  Recent Labs Lab 06/25/15 1933  TROPONINI 0.03      BNP (last 3 results)  Recent Labs  06/26/15 1355  BNP 362.7*    CBG:  Recent Labs Lab 06/26/15 2134 06/26/15 2320 06/27/15 0640 06/27/15 0815 06/27/15 1240  GLUCAP 260* 264* 215* 229* 221*    Recent Results (from the past 240 hour(s))  Blood Culture (routine x 2)     Status: None (Preliminary result)   Collection Time: 06/25/15 11:05 AM  Result Value Ref Range Status   Specimen Description BLOOD LEFT ANTECUBITAL DRAWN BY RN  Final   Special Requests BOTTLES DRAWN AEROBIC AND ANAEROBIC 6CC EACH  Final   Culture NO GROWTH 2 DAYS  Final   Report Status PENDING  Incomplete  Urine culture     Status: None   Collection Time: 06/25/15 11:06 AM  Result Value Ref Range Status   Specimen Description URINE, CATHETERIZED  Final   Special Requests NONE  Final   Culture   Final    NO GROWTH 1 DAY [facility]    Report Status 06/26/2015 FINAL  Final  Blood Culture (routine x 2)     Status: None (Preliminary result)   Collection Time: 06/25/15 12:21 PM  Result Value Ref Range Status   Specimen Description BLOOD LEFT HAND  Final   Special Requests BOTTLES DRAWN AEROBIC ONLY 4CC  Final   Culture NO GROWTH 2 DAYS  Final   Report Status PENDING  Incomplete  MRSA PCR Screening     Status: None   Collection Time: 06/25/15  9:08 PM  Result Value Ref Range Status   MRSA by PCR NEGATIVE NEGATIVE Final    Comment:        The GeneXpert MRSA Assay (FDA approved for NASAL specimens only), is one component of a comprehensive MRSA colonization surveillance program. It is not intended to diagnose MRSA infection nor to guide or monitor treatment for MRSA infections.   Culture, blood (x 2)     Status: None (Preliminary result)   Collection Time: 06/25/15 11:06 PM  Result Value Ref Range Status   Specimen Description LEFT ANTECUBITAL  Final   Special Requests BOTTLES DRAWN  AEROBIC ONLY 4CC  Final   Culture NO GROWTH 2 DAYS  Final   Report Status PENDING   Incomplete  Culture, blood (x 2)     Status: None (Preliminary result)   Collection Time: 06/25/15 11:16 PM  Result Value Ref Range Status   Specimen Description RIGHT ANTECUBITAL  Final   Special Requests IN PEDIATRIC BOTTLE 3CC  Final   Culture NO GROWTH 2 DAYS  Final   Report Status PENDING  Incomplete     Studies: Mr Brain Wo Contrast  06/27/2015  CLINICAL DATA:  Found down by husband yesterday morning, poorly responsive. Febrile and hypotensive. History of diabetes, hyperlipidemia. EXAM: MRI HEAD WITHOUT CONTRAST TECHNIQUE: Multiplanar, multiecho pulse sequences of the brain and surrounding structures were obtained without intravenous contrast. COMPARISON:  CT head June 25, 2015 and MRI of the brain January 22, 2015 FINDINGS: Mildly motion degraded examination. The ventricles and sulci are normal for patient's age. No abnormal parenchymal signal, mass lesions, mass effect. No reduced diffusion to suggest acute ischemia. No susceptibility artifact to suggest hemorrhage. No abnormal extra-axial fluid collections. No extra-axial masses though, contrast enhanced sequences would be more sensitive. Normal major intracranial vascular flow voids seen at the skull base. Ocular globes and orbital contents are unremarkable though not tailored for evaluation. No abnormal sellar expansion. No suspicious calvarial bone marrow signal. Craniocervical junction maintained. Small RIGHT maxillary mucosal retention cyst. Small bilateral mastoid effusions. Is LEFT parotid gland appears enlarged with a 15 mm focus of low T2 signal associated with reduced diffusion. IMPRESSION: Negative motion degraded noncontrast MRI brain. LEFT parotid gland may be enlarged, with superimposed 15 mm possible hematoma or mass. Recommend follow-up evaluation on a nonemergent basis. Electronically Signed   By: Awilda Metro M.D.   On: 06/27/2015 03:23     Scheduled Meds: . acyclovir  600 mg Intravenous 3 times per day  . antiseptic  oral rinse  7 mL Mouth Rinse BID  . cefTRIAXone (ROCEPHIN)  IV  2 g Intravenous Q12H  . insulin aspart  0-15 Units Subcutaneous Q6H  . insulin glargine  25 Units Subcutaneous QHS  . sodium chloride  3 mL Intravenous Q12H  . vancomycin  1,000 mg Intravenous Q12H   Continuous Infusions: . sodium chloride 75 mL/hr at 06/27/15 0927   PRN Meds: acetaminophen **OR** acetaminophen, haloperidol lactate, LORazepam, morphine injection, ondansetron **OR** ondansetron (ZOFRAN) IV  Time spent: 30 minutes  Author: Lynden Oxford, MD [facility] Pager: [phone] 06/27/2015 1:46 PM  If 7PM-7AM, please contact night-coverage at [email or web address], password [facility]

## 2015-06-27 NOTE — Progress Notes (Addendum)
[facility]    Date of Admission:  06/25/2015   Total days of antibiotics 3        Day 3 vanco        Day 3 ceftriaxone        Day 3 acyclovir   ID: Valerie Wagner is a 65 y.o. female with acute encephalopathy and fevers, concerning for meningoencephalitis Principal Problem:   Acute encephalopathy Active Problems:   Essential hypertension, benign   Severe sepsis (HCC)   Somnolence   Chronic pain   Type 2 diabetes mellitus (HCC)   Polyneuropathy (HCC)   Severe obesity (BMI >= 40) (HCC)   Altered mental state   Sepsis (HCC)   Type 2 diabetes mellitus without complication, without long-term current use of insulin (HCC)   Fever    Subjective: Still febrile 102F   Medications:  . acyclovir  600 mg Intravenous 3 times per day  . antiseptic oral rinse  7 mL Mouth Rinse BID  . cefTRIAXone (ROCEPHIN)  IV  2 g Intravenous Q12H  . insulin aspart  0-15 Units Subcutaneous Q6H  . insulin glargine  25 Units Subcutaneous QHS  . sodium chloride  3 mL Intravenous Q12H  . vancomycin  1,000 mg Intravenous Q12H    Objective: Vital signs in last 24 hours: Temp:  [100.9 F (38.3 C)-102.9 F (39.4 C)] 100.9 F (38.3 C) (01/07 0805) Pulse Rate:  [73-103] 83 (01/07 0805) Resp:  [17-26] 17 (01/07 0805) BP: (111-188)/(57-83) 142/69 mmHg (01/07 0805) SpO2:  [94 %-99 %] 97 % (01/07 0805) Physical Exam  Constitutional:  oriented to person. appears well-developed and well-nourished. No distress.  HENT: [place]/AT, PERRLA, no scleral icterus Mouth/Throat: dry dark/brown coating Cardiovascular: Normal rate, regular rhythm and normal heart sounds. Exam reveals no gallop and no friction rub.  No murmur heard.  Pulmonary/Chest: Effort normal and breath sounds normal. No respiratory distress.  has no wheezes.  Neck = supple, no nuchal rigidity Abdominal: Soft. Bowel sounds are normal.  exhibits no distension. There is no tenderness.  Lymphadenopathy: no cervical adenopathy.  No axillary adenopathy Neurological: alert and oriented to person, only. Skin: Skin is warm and dry. No rash noted. No erythema.  Psychiatric: a normal mood and affect.  behavior is normal.    Lab Results  Recent Labs  06/26/15 0420 06/26/15 1355 06/27/15 0340  WBC  --  13.8* 15.7*  HGB  --  12.0 13.4  HCT  --  36.2 39.9  NA 139  --  137  K 4.8  --  3.7  CL 108  --  102  CO2 20*  --  23  BUN 6  --  <5*  CREATININE 0.77  --  0.68   Liver Panel  Recent Labs  06/25/15 1105 06/25/15 2306  PROT 8.1 6.5  ALBUMIN 4.5 3.6  AST 38 32  ALT 36 30  ALKPHOS 81 65  BILITOT 0.9 0.6   Microbiology: 1/5 blood cx x 4 1/4 urine cx pending  Studies/Results: Ct Abdomen Pelvis Wo Contrast  06/25/2015  CLINICAL DATA:  Found on the floor unresponsive at 10 a.m. EXAM: CT ABDOMEN AND PELVIS WITHOUT CONTRAST TECHNIQUE: Multidetector CT imaging of the abdomen and pelvis was performed following the standard protocol without IV contrast. COMPARISON:  10/14/2013, 05/20/2014 FINDINGS: Lower chest:  Clear lung bases.  Normal heart size. Hepatobiliary: Normal liver.  Prior cholecystectomy. Pancreas: Normal. Spleen: Normal. Adrenals/Urinary Tract: Normal adrenal glands. 12 mm exophytic hypodense, fluid attenuating  right renal mass most consistent with a cyst. No urolithiasis or obstructive uropathy. Decompressed bladder with a Foley catheter present. Stomach/Bowel: No bowel wall thickening or dilatation. Small hiatal hernia. No pneumatosis, pneumoperitoneum or portal venous gas. No abdominal pelvic free fluid. Vascular/Lymphatic: Normal caliber abdominal aorta with mild atherosclerosis. No lymphadenopathy. Reproductive: No adnexal mass.  Prior hysterectomy. Musculoskeletal: No acute osseous abnormality. No lytic or sclerotic osseous lesion. There is a 2.3 cm hypodense, fluid attenuating partially visualized mass in the left adductor brevis muscle concerning for a liquified hematoma versus ganglion cyst, but  this is incompletely characterized. IMPRESSION: 1. No acute abdominal or pelvic pathology. 2. 2.3 cm hypodense, fluid attenuating partially visualized mass in the left adductor brevis muscle concerning for a liquified hematoma versus ganglion cyst, but this is incompletely characterized. This is unchanged compared with 05/20/2014. Electronically Signed   By: Elige KoHetal  Patel   On: 06/25/2015 12:25   Ct Head Wo Contrast  06/25/2015  CLINICAL DATA:  Found on floor.  Altered mental status EXAM: CT HEAD WITHOUT CONTRAST TECHNIQUE: Contiguous axial images were obtained from the base of the skull through the vertex without intravenous contrast. COMPARISON:  MRI 01/22/2015 FINDINGS: Image quality degraded by significant motion. This study was repeated however there is persistent motion on the repeat exam Ventricle size normal. Negative for acute infarct. Negative for acute hemorrhage or mass. Subtle findings could be missed on the study given the amount of motion. IMPRESSION: Image quality degraded by significant motion. No acute abnormality. Repeat study recommended if symptoms persist. Electronically Signed   By: Marlan Palauharles  Clark M.D.   On: 06/25/2015 12:24   Mr Brain Wo Contrast  06/27/2015  CLINICAL DATA:  Found down by husband yesterday morning, poorly responsive. Febrile and hypotensive. History of diabetes, hyperlipidemia. EXAM: MRI HEAD WITHOUT CONTRAST TECHNIQUE: Multiplanar, multiecho pulse sequences of the brain and surrounding structures were obtained without intravenous contrast. COMPARISON:  CT head June 25, 2015 and MRI of the brain January 22, 2015 FINDINGS: Mildly motion degraded examination. The ventricles and sulci are normal for patient's age. No abnormal parenchymal signal, mass lesions, mass effect. No reduced diffusion to suggest acute ischemia. No susceptibility artifact to suggest hemorrhage. No abnormal extra-axial fluid collections. No extra-axial masses though, contrast enhanced sequences would be  more sensitive. Normal major intracranial vascular flow voids seen at the skull base. Ocular globes and orbital contents are unremarkable though not tailored for evaluation. No abnormal sellar expansion. No suspicious calvarial bone marrow signal. Craniocervical junction maintained. Small RIGHT maxillary mucosal retention cyst. Small bilateral mastoid effusions. Is LEFT parotid gland appears enlarged with a 15 mm focus of low T2 signal associated with reduced diffusion. IMPRESSION: Negative motion degraded noncontrast MRI brain. LEFT parotid gland may be enlarged, with superimposed 15 mm possible hematoma or mass. Recommend follow-up evaluation on a nonemergent basis. Electronically Signed   By: Awilda Metroourtnay  Bloomer M.D.   On: 06/27/2015 03:23   Dg Chest Port 1 View  06/25/2015  CLINICAL DATA:  Sepsis.  Emesis immediately prior to the exam. EXAM: PORTABLE CHEST 1 VIEW COMPARISON:  Earlier this day at 1743 hour FINDINGS: Cardiomegaly and vascular congestion, unchanged allowing for differences in technique. No consolidation to suggest pneumonia. Unchanged elevation right hemidiaphragm. No pleural effusion or pneumothorax. IMPRESSION: Unchanged cardiomegaly and vascular congestion. No new abnormality is seen. Electronically Signed   By: Rubye OaksMelanie  Ehinger M.D.   On: 06/25/2015 22:43   Dg Chest Port 1 View  06/25/2015  CLINICAL DATA:  Altered mental status.  EXAM: PORTABLE CHEST 1 VIEW COMPARISON:  01/21/2015 FINDINGS: Cardiac enlargement with vascular congestion. Mild right lower lobe atelectasis. No effusion. IMPRESSION: Cardiac enlargement with vascular congestion Mild right lower lobe atelectasis. Electronically Signed   By: Marlan Palau M.D.   On: 06/25/2015 11:44   Dg Chest Port 1v Same Day  06/25/2015  CLINICAL DATA:  Patient was found unresponsive this morning. New onset of wheezing and vomiting today. EXAM: PORTABLE CHEST 1 VIEW COMPARISON:  January 5th 05/09/2016 1 a.m. FINDINGS: The heart size and  mediastinal contours are stable. Heart size is enlarged. There is central pulmonary vascular congestion. There is no focal pneumonia or pleural effusion. The visualized skeletal structures are stable. IMPRESSION: Cardiomegaly and central pulmonary vascular congestion not changed. Electronically Signed   By: Sherian Rein M.D.   On: 06/25/2015 17:58   Per my review of MRI, no changes to temporal lobs.   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 It would be of value to help possibly with de-escalation of antibiotics. Continue with current abtx.   Kidney function = appears to tolerate acyclovir for now  Fever = continue to monitor to see if trends down.  Parotid inflammation on imaging = does not correlate to exam, though will ask patient if having any paint on left side tomorrow    Dr[facility] Infect[facility] Diseases Cell: [phone] Pager: [phone]  06/27/2015, 10:48 AM

## 2015-06-27 NOTE — Progress Notes (Signed)
Bedside EEG completed, results pending. 

## 2015-06-28 ENCOUNTER — Inpatient Hospital Stay (HOSPITAL_COMMUNITY): Payer: Medicaid Other

## 2015-06-28 LAB — COMPREHENSIVE METABOLIC PANEL
ALT: 36 U/L (ref 14–54)
AST: 45 U/L — ABNORMAL HIGH (ref 15–41)
Albumin: 3 g/dL — ABNORMAL LOW (ref 3.5–5.0)
Alkaline Phosphatase: 51 U/L (ref 38–126)
Anion gap: 11 (ref 5–15)
BUN: 6 mg/dL (ref 6–20)
CHLORIDE: 100 mmol/L — AB (ref 101–111)
CO2: 28 mmol/L (ref 22–32)
Calcium: 9.1 mg/dL (ref 8.9–10.3)
Creatinine, Ser: 0.66 mg/dL (ref 0.44–1.00)
Glucose, Bld: 201 mg/dL — ABNORMAL HIGH (ref 65–99)
POTASSIUM: 2.9 mmol/L — AB (ref 3.5–5.1)
SODIUM: 139 mmol/L (ref 135–145)
Total Bilirubin: 0.6 mg/dL (ref 0.3–1.2)
Total Protein: 6.2 g/dL — ABNORMAL LOW (ref 6.5–8.1)

## 2015-06-28 LAB — GLUCOSE, CAPILLARY
GLUCOSE-CAPILLARY: 192 mg/dL — AB (ref 65–99)
GLUCOSE-CAPILLARY: 209 mg/dL — AB (ref 65–99)
GLUCOSE-CAPILLARY: 211 mg/dL — AB (ref 65–99)
Glucose-Capillary: 204 mg/dL — ABNORMAL HIGH (ref 65–99)
Glucose-Capillary: 226 mg/dL — ABNORMAL HIGH (ref 65–99)
Glucose-Capillary: 187 mg/dL — ABNORMAL HIGH (ref 65–99)

## 2015-06-28 LAB — BLOOD GAS, ARTERIAL
ACID-BASE DEFICIT: 0.7 mmol/L (ref 0.0–2.0)
ACID-BASE EXCESS: 0.6 mmol/L (ref 0.0–2.0)
BICARBONATE: 23 meq/L (ref 20.0–24.0)
Bicarbonate: 24.8 mEq/L — ABNORMAL HIGH (ref 20.0–24.0)
DRAWN BY: 44138
DRAWN BY: 44138
O2 CONTENT: 4 L/min
O2 CONTENT: 6 L/min
O2 SAT: 91.9 %
O2 Saturation: 95.4 %
PATIENT TEMPERATURE: 98.6
PATIENT TEMPERATURE: 98.6
PCO2 ART: 40.1 mmHg (ref 35.0–45.0)
PH ART: 7.434 (ref 7.350–7.450)
TCO2: 24.1 mmol/L (ref 0–100)
TCO2: 26 mmol/L (ref 0–100)
pCO2 arterial: 34.9 mmHg — ABNORMAL LOW (ref 35.0–45.0)
pH, Arterial: 7.408 (ref 7.350–7.450)
pO2, Arterial: 60.1 mmHg — ABNORMAL LOW (ref 80.0–100.0)
pO2, Arterial: 77.6 mmHg — ABNORMAL LOW (ref 80.0–100.0)

## 2015-06-28 LAB — CBC WITH DIFFERENTIAL/PLATELET
BASOS ABS: 0 10*3/uL (ref 0.0–0.1)
Basophils Relative: 0 %
EOS ABS: 0.2 10*3/uL (ref 0.0–0.7)
EOS PCT: 1 %
HCT: 37 % (ref 36.0–46.0)
Hemoglobin: 12.9 g/dL (ref 12.0–15.0)
LYMPHS ABS: 2.7 10*3/uL (ref 0.7–4.0)
LYMPHS PCT: 21 %
MCH: 31.2 pg (ref 26.0–34.0)
MCHC: 34.9 g/dL (ref 30.0–36.0)
MCV: 89.4 fL (ref 78.0–100.0)
MONO ABS: 1.2 10*3/uL — AB (ref 0.1–1.0)
Monocytes Relative: 9 %
Neutro Abs: 8.9 10*3/uL — ABNORMAL HIGH (ref 1.7–7.7)
Neutrophils Relative %: 69 %
PLATELETS: 242 10*3/uL (ref 150–400)
RBC: 4.14 MIL/uL (ref 3.87–5.11)
RDW: 12.1 % (ref 11.5–15.5)
WBC: 13 10*3/uL — AB (ref 4.0–10.5)

## 2015-06-28 LAB — BASIC METABOLIC PANEL
Anion gap: 14 (ref 5–15)
BUN: 8 mg/dL (ref 6–20)
CHLORIDE: 103 mmol/L (ref 101–111)
CO2: 24 mmol/L (ref 22–32)
Calcium: 9.6 mg/dL (ref 8.9–10.3)
Creatinine, Ser: 0.8 mg/dL (ref 0.44–1.00)
GFR calc Af Amer: >60 mL/min (ref >60)
Glucose, Bld: 216 mg/dL — ABNORMAL HIGH (ref 65–99)
POTASSIUM: 4.7 mmol/L (ref 3.5–5.1)
SODIUM: 141 mmol/L (ref 135–145)

## 2015-06-28 LAB — MAGNESIUM: MAGNESIUM: 2 mg/dL (ref 1.7–2.4)

## 2015-06-28 MED ORDER — SODIUM CHLORIDE 0.9 % IV SOLN
1000.0000 mg | Freq: Once | INTRAVENOUS | Status: AC
  Administered 2015-06-28: 1000 mg via INTRAVENOUS
  Filled 2015-06-28: qty 10

## 2015-06-28 MED ORDER — POTASSIUM CHLORIDE 10 MEQ/100ML IV SOLN
10.0000 meq | INTRAVENOUS | Status: AC
  Administered 2015-06-28 (×4): 10 meq via INTRAVENOUS
  Filled 2015-06-28 (×4): qty 100

## 2015-06-28 MED ORDER — SODIUM CHLORIDE 0.9 % IV SOLN
500.0000 mg | Freq: Two times a day (BID) | INTRAVENOUS | Status: DC
  Administered 2015-06-29 (×2): 500 mg via INTRAVENOUS
  Filled 2015-06-28 (×4): qty 5

## 2015-06-28 NOTE — Progress Notes (Signed)
Pt found not alert to herself, difficult to arouse, diaphoretic, CBG 211, BP 165/93, sats 88, drooling upon shift change assessment. Rapid response was called for further eval. NP at bedside as well

## 2015-06-28 NOTE — Progress Notes (Signed)
ANTIBIOTIC CONSULT NOTE  Pharmacy Consult for Acyclovir, Vancomycin and Ceftriaxone Indication: rule out sepsis unresponsive / r/o meningitis - CNS infection  Allergies  Allergen Reactions  . Nsaids Shortness Of Breath  . Bee Venom   . Chocolate   . Darvon [Propoxyphene]   . Ivp Dye [Iodinated Diagnostic Agents] Swelling  . Doxycycline Rash  . Penicillins Rash   Patient Measurements: Height: 5\' 4"  (162.6 cm) Weight: 277 lb 1.9 oz (125.7 kg) IBW/kg (Calculated) : 54.7 Last recorded weight = 119.7 Kg IBW ~ 60Kg  Vital Signs: Temp: 100.2 F (37.9 C) (01/08 1307) Temp Source: Core (Comment) (01/08 1307) BP: 124/89 mmHg (01/08 1307) Pulse Rate: 69 (01/08 1307)  Labs:  Recent Labs  06/26/15 0420 06/26/15 1355 06/27/15 0340 06/28/15 0402  WBC  --  13.8* 15.7* 13.0*  HGB  --  12.0 13.4 12.9  PLT  --  229 220 242  CREATININE 0.77  --  0.68 0.66   Estimated Creatinine Clearance: 95.7 mL/min (by C-G formula based on Cr of 0.66).  Microbiology: Recent Results (from the past 720 hour(s))  Blood Culture (routine x 2)     Status: None (Preliminary result)   Collection Time: 06/25/15 11:05 AM  Result Value Ref Range Status   Specimen Description BLOOD LEFT ANTECUBITAL DRAWN BY RN  Final   Special Requests BOTTLES DRAWN AEROBIC AND ANAEROBIC 6CC EACH  Final   Culture NO GROWTH 3 DAYS  Final   Report Status PENDING  Incomplete  Urine culture     Status: None   Collection Time: 06/25/15 11:06 AM  Result Value Ref Range Status   Specimen Description URINE, CATHETERIZED  Final   Special Requests NONE  Final   Culture   Final    NO GROWTH 1 DAY Performed at [facility]    Report Status 06/26/2015 FINAL  Final  Blood Culture (routine x 2)     Status: None (Preliminary result)   Collection Time: 06/25/15 12:21 PM  Result Value Ref Range Status   Specimen Description BLOOD LEFT HAND  Final   Special Requests BOTTLES DRAWN AEROBIC ONLY 4CC  Final   Culture NO GROWTH  3 DAYS  Final   Report Status PENDING  Incomplete  MRSA PCR Screening     Status: None   Collection Time: 06/25/15  9:08 PM  Result Value Ref Range Status   MRSA by PCR NEGATIVE NEGATIVE Final    Comment:        The GeneXpert MRSA Assay (FDA approved for NASAL specimens only), is one component of a comprehensive MRSA colonization surveillance program. It is not intended to diagnose MRSA infection nor to guide or monitor treatment for MRSA infections.   Culture, blood (x 2)     Status: None (Preliminary result)   Collection Time: 06/25/15 11:06 PM  Result Value Ref Range Status   Specimen Description LEFT ANTECUBITAL  Final   Special Requests BOTTLES DRAWN AEROBIC ONLY 4CC  Final   Culture NO GROWTH 3 DAYS  Final   Report Status PENDING  Incomplete  Culture, blood (x 2)     Status: None (Preliminary result)   Collection Time: 06/25/15 11:16 PM  Result Value Ref Range Status   Specimen Description RIGHT ANTECUBITAL  Final   Special Requests IN PEDIATRIC BOTTLE 3CC  Final   Culture NO GROWTH 3 DAYS  Final   Report Status PENDING  Incomplete   Medical History: Past Medical History  Diagnosis Date  . Chest pain  Reassuring workup of her time including negative cardiac catheterization and stress testing  . Type 2 diabetes mellitus   . Mixed hyperlipidemia   . Obesity   . Essential hypertension, benign   . COPD (chronic obstructive pulmonary disease)   . DJD (degenerative joint disease)   . Urinary incontinence   . GERD (gastroesophageal reflux disease)   . IBS (irritable bowel syndrome)   . Fibromyalgia   . Asthma   . Neuropathy    Medications:  Prescriptions prior to admission  Medication Sig Dispense Refill Last Dose  . albuterol (VENTOLIN HFA) 108 (90 BASE) MCG/ACT inhaler Inhale 2 puffs into the lungs every 6 (six) hours as needed for wheezing or shortness of breath.    06/24/2015 at Unknown time  . ALPRAZolam (XANAX) 0.5 MG tablet Take 0.5 mg by mouth 3 (three)  times daily as needed for anxiety or sleep.    06/24/2015 at Unknown time  . benazepril-hydrochlorthiazide (LOTENSIN HCT) 20-12.5 MG per tablet Take 2 tablets by mouth daily.    06/24/2015 at Unknown time  . EPINEPHrine (EPIPEN JR) 0.15 MG/0.3ML injection Inject 0.15 mg into the muscle as needed for anaphylaxis.   unknown at Unknown time  . Fluticasone-Salmeterol (ADVAIR) 500-50 MCG/DOSE AEPB Inhale 1 puff into the lungs every 12 (twelve) hours.   06/24/2015 at Unknown time  . glipiZIDE (GLUCOTROL XL) 10 MG 24 hr tablet Take 10 mg by mouth daily with breakfast.   06/24/2015 at Unknown time  . HYDROcodone-acetaminophen (NORCO) 7.5-325 MG per tablet Take 1 tablet by mouth every 6 (six) hours as needed for moderate pain.   06/24/2015 at Unknown time  . insulin glargine (LANTUS) 100 UNIT/ML injection Inject 70 Units into the skin at bedtime.    06/24/2015 at Unknown time  . ipratropium-albuterol (DUONEB) 0.5-2.5 (3) MG/3ML SOLN Take 3 mLs by nebulization every 4 (four) hours as needed (wheezing).    06/24/2015 at Unknown time  . nitroGLYCERIN (NITROSTAT) 0.4 MG SL tablet Place 0.4 mg under the tongue every 5 (five) minutes as needed for chest pain.    06/24/2015 at Unknown time  . Potassium Citrate (UROCIT-K 15) 15 MEQ (1620 MG) TBCR Take 1 tablet by mouth 2 (two) times daily.   06/24/2015 at Unknown time  . pregabalin (LYRICA) 50 MG capsule Take 50 mg by mouth 2 (two) times daily.   06/24/2015 at Unknown time  . tiotropium (SPIRIVA) 18 MCG inhalation capsule Place 18 mcg into inhaler and inhale daily.   06/24/2015 at Unknown time   Assessment: 65yo female presented to ED, initially unresponsive with low BP.  Asked to initiate ABX and Acyclovir for suspected sepsis / meningitis.  Today is day 3 of therapy and she continues to spike temp. requiring acetaminophen.  Attempted LP but had to stop due to agitation.  Had episode of respiratory distress and tachycardia in early morning hours.  Her encephalopathy has not improved much.   She had an EEG yesterday to r/o seizures with results pending.  ID on board and noted plans to continue current IV antibiotics for now.  Goal of Therapy:  Eradicate infection.  Plan:  Continue Acyclovir 600mg  every 8 hours. Continue IV Vancomycin 1000 mg every 12 hours Continue Ceftriaxone 2 gm every 12 hours. Will check a vancomycin trough to ensure appropriate clearance.  Nadara MustardNita Justise Ehmann, PharmD., MS Clinical Pharmacist Pager:  [phone] Thank you for allowing pharmacy to be part of this patients care team. 06/28/2015,1:36 PM

## 2015-06-28 NOTE — Progress Notes (Signed)
[facility] Progress Note  Patient: Valerie Wagner:[id number]   PCP: Donzetta Sprung, MD DOB: [birth year]   DOA: 06/25/2015   DOS: 06/28/2015   Date of Service: the patient was seen and examined on 06/28/2015  Subjective: Fever continues to remain despite day 3 of IV antibiotics. Was unable to tolerate lumbar puncture yesterday despite benzodiazepine as well as Haldol. Earlier in the morning patient was more hypoxic and lethargic. Nutrition: Nothing by mouth at present Activity: Mostly bedridden Last BM: 06/27/2015  Assessment and Plan: 1. 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 Patient husband found her on the floor not waking up. She did not have loss of pulse. EMS found her combative and she was given Versed. In the ER the patient was not responsive but was found to be having fever, lactic acidosis, leukocytosis. With this the patient was found to be meeting criteria for sepsis suspecting CNS infection. Infectious disease is consulted and the patient is currently on vancomycin and ceftriaxone, acyclovir. DAY 3 Discussed with interventional radiology as well as anesthesia, patient will be likely scheduled for a procedure tomorrow under guidance of anesthesia.  2. Acute encephalopathy. CT scan is unremarkable but it has been a motion degraded exam. MRI also unremarkable. Patient able to verbalize her name and follow complex command.  ABG no hypercarbia, lab data parameters within acceptable range. Neurology following. EEG results pending  3. Type 2 diabetes mellitus. Due to patient's nothing by mouth status insulin Lantus at a lower dose. Continue moderate sliding scale Every 4 hours.  4. Essential hypertension. Holding antihypertensive medications at present.  5. Chronic pain with polyneuropathy. Patient is on chronic pain medication at home, will use when necessary morphine.  6.  Severe obesity. Patient may have obesity hypoventilation syndrome. May benefit from outpatient sleep study. ABG shows no hypercarbia  7. Hypoxia. Earlier in the morning on 06/28/2015 the patient become more lethargic and hypoxic. Rapid response was called. ABG does not show any evidence of hypoxia or hypercarbia. Chest x-ray showed some vascular congestion. But on comparison, chest x-ray looks much better as compared to admission chest x-ray. Discontinue IV fluids.  Patient is at high risk for prolonged intubation given her morbid obesity and acute encephalopathy. We will involve critical care in the morning as well prior to the procedure.  DVT Prophylaxis: subcutaneous Heparin Nutrition: Nothing by mouth at present, day 3  Advance goals of care discussion: Full code  Brief Summary of Hospitalization:  HPI: As per the H and P dictated on admission, "patient presented at [facility] with an episode of unresponsiveness. Was not waking up on verbal command did not lose pulse." Daily update, Procedures: 06/26/2015 unable to complete fluoroscopy guided lumbar puncture, MRI after sedation, EEG., 06/26/2025 MRI negative, lumbar puncture re ordered Consultants: Infectious disease, neurology, intervention radiology Antibiotics: Anti-infectives    Start     Dose/Rate Route Frequency Ordered Stop   06/26/15 1730  acyclovir (ZOVIRAX) 600 mg in dextrose 5 % 100 mL IVPB     600 mg 112 mL/hr over 60 Minutes Intravenous 3 times per day 06/26/15 1717     06/26/15 1600  ampicillin (OMNIPEN) 2 g in sodium chloride 0.9 % 50 mL IVPB  Status:  Discontinued     2 g 150 mL/hr over 20 Minutes Intravenous 6 times per day 06/26/15 1529 06/26/15 1658   06/26/15 1530  vancomycin (VANCOCIN) IVPB 1000 mg/200 mL premix  Status:  Discontinued     1,000 mg 200 mL/hr over 60 Minutes Intravenous  Once 06/26/15 1529 06/26/15 1533   06/26/15 1530  cefTRIAXone (ROCEPHIN) 2 g in dextrose 5 % 50 mL IVPB     2 g 100  mL/hr over 30 Minutes Intravenous Every 12 hours 06/26/15 1529     06/26/15 1000  levofloxacin (LEVAQUIN) IVPB 750 mg  Status:  Discontinued     750 mg 100 mL/hr over 90 Minutes Intravenous Every 24 hours 06/25/15 2052 06/26/15 1530   06/26/15 0200  vancomycin (VANCOCIN) IVPB 1000 mg/200 mL premix     1,000 mg 200 mL/hr over 60 Minutes Intravenous Every 12 hours 06/25/15 2052     06/25/15 2200  aztreonam (AZACTAM) 2 g in dextrose 5 % 50 mL IVPB  Status:  Discontinued     2 g 100 mL/hr over 30 Minutes Intravenous 3 times per day 06/25/15 2052 06/26/15 1530   06/25/15 2100  aztreonam (AZACTAM) 2 g in dextrose 5 % 50 mL IVPB  Status:  Discontinued     2 g 100 mL/hr over 30 Minutes Intravenous  Once 06/25/15 1625 06/25/15 2052   06/25/15 1430  acyclovir (ZOVIRAX) 600 mg in dextrose 5 % 100 mL IVPB     600 mg 112 mL/hr over 60 Minutes Intravenous  Once 06/25/15 1405 06/25/15 1601   06/25/15 1315  vancomycin (VANCOCIN) IVPB 1000 mg/200 mL premix    Comments:  ** Give 1000mg  IV x 2 bags total back to back to = total dose of 2000mg  **   1,000 mg 200 mL/hr over 60 Minutes Intravenous  Once 06/25/15 1115 06/25/15 1459   06/25/15 1215  vancomycin (VANCOCIN) IVPB 1000 mg/200 mL premix    Comments:  ** Give 1000mg  IV x 2 bags total back to back to = total dose of 2000mg  **   1,000 mg 200 mL/hr over 60 Minutes Intravenous  Once 06/25/15 1115 06/25/15 1403   06/25/15 1115  levofloxacin (LEVAQUIN) IVPB 750 mg     750 mg 100 mL/hr over 90 Minutes Intravenous  Once 06/25/15 1106 06/25/15 1336   06/25/15 1115  aztreonam (AZACTAM) 2 g in dextrose 5 % 50 mL IVPB     2 g 100 mL/hr over 30 Minutes Intravenous  Once 06/25/15 1106 06/25/15 1245   06/25/15 1115  vancomycin (VANCOCIN) IVPB 1000 mg/200 mL premix  Status:  Discontinued     1,000 mg 200 mL/hr over 60 Minutes Intravenous  Once 06/25/15 1106 06/25/15 1110      Family Communication: no family at bedside at the time of interview.   Disposition:    Barriers to safe discharge: Altered sensorium   Intake/Output Summary (Last 24 hours) at 06/28/15 1407 Last data filed at 06/28/15 1324  Gross per 24 hour  Intake   1915 ml  Output   2050 ml  Net   -135 ml   Filed Weights   06/25/15 1420 06/25/15 2104  Weight: 158.759 kg (350 lb) 125.7 kg (277 lb 1.9 oz)    Objective: Physical Exam: Filed Vitals:   06/28/15 0559 06/28/15 0600 06/28/15 0853 06/28/15 1307  BP: 161/99 172/95 157/86 124/89  Pulse: 105 102 73 69  Temp: 101.3 F (38.5 C) 101.3 F (38.5 C) 100.8 F (38.2 C) 100.2 F (37.9 C)  TempSrc:   Core (Comment) Core (Comment)  Resp: 26 39 24 21  Height:      Weight:      SpO2: 89% 94% 97% 97%  General: Appear in mild distress, no Rash; Oral Mucosa moist. Cardiovascular: S1 and S2 Present, no Murmur, no JVD Respiratory: Bilateral Air entry present and Clear to Auscultation, no Crackles, no wheezes Abdomen: Bowel Sound present, Soft and no tenderness Extremities: bilateral Pedal edema, no calf tenderness Neurology: Mental status verbalizing her name and follow command, unable to track Cranial Nerves PERRL,  Motor strength spontaneous movement of both extremities, right more than left Sensation present to painful stimuli reflexes  babinski unable to elicit  Data Reviewed: CBC:  Recent Labs Lab 06/25/15 1105 06/25/15 2306 06/26/15 1355 06/27/15 0340 06/28/15 0402  WBC 19.3* 16.4* 13.8* 15.7* 13.0*  NEUTROABS 16.7* 14.5* 11.3*  --  8.9*  HGB 15.6* 12.7 12.0 13.4 12.9  HCT 45.1 39.3 36.2 39.9 37.0  MCV 90.7 90.1 89.8 90.3 89.4  PLT 319 254 229 220 242   Basic Metabolic Panel:  Recent Labs Lab 06/25/15 1105 06/25/15 2306 06/26/15 0420 06/27/15 0340 06/28/15 0402  NA 139 138 139 137 139  K 3.7 3.6 4.8 3.7 2.9*  CL 101 104 108 102 100*  CO2 25 20* 20* 23 28  GLUCOSE 351* 311* 282* 264* 201*  BUN 11 7 6  <5* 6  CREATININE 0.85 0.89 0.77 0.68 0.66  CALCIUM 10.0 8.7* 8.5* 8.8* 9.1   Liver  Function Tests:  Recent Labs Lab 06/25/15 1105 06/25/15 2306 06/28/15 0402  AST 38 32 45*  ALT 36 30 36  ALKPHOS 81 65 51  BILITOT 0.9 0.6 0.6  PROT 8.1 6.5 6.2*  ALBUMIN 4.5 3.6 3.0*   No results for input(s): LIPASE, AMYLASE in the last 168 hours.  Recent Labs Lab 06/26/15 1355  AMMONIA 44*    Cardiac Enzymes:  Recent Labs Lab 06/25/15 1933  TROPONINI 0.03    BNP (last 3 results)  Recent Labs  06/26/15 1355  BNP 362.7*    CBG:  Recent Labs Lab 06/27/15 2125 06/27/15 2325 06/28/15 0329 06/28/15 0852 06/28/15 1225  GLUCAP 208* 182* 192* 209* 204*    Recent Results (from the past 240 hour(s))  Blood Culture (routine x 2)     Status: None (Preliminary result)   Collection Time: 06/25/15 11:05 AM  Result Value Ref Range Status   Specimen Description BLOOD LEFT ANTECUBITAL DRAWN BY RN  Final   Special Requests BOTTLES DRAWN AEROBIC AND ANAEROBIC 6CC EACH  Final   Culture NO GROWTH 3 DAYS  Final   Report Status PENDING  Incomplete  Urine culture     Status: None   Collection Time: 06/25/15 11:06 AM  Result Value Ref Range Status   Specimen Description URINE, CATHETERIZED  Final   Special Requests NONE  Final   Culture   Final    NO GROWTH 1 DAY Performed at [facility]    Report Status 06/26/2015 FINAL  Final  Blood Culture (routine x 2)     Status: None (Preliminary result)   Collection Time: 06/25/15 12:21 PM  Result Value Ref Range Status   Specimen Description BLOOD LEFT HAND  Final   Special Requests BOTTLES DRAWN AEROBIC ONLY 4CC  Final   Culture NO GROWTH 3 DAYS  Final   Report Status PENDING  Incomplete  MRSA PCR Screening     Status: None   Collection Time: 06/25/15  9:08 PM  Result Value Ref Range Status   MRSA by PCR NEGATIVE NEGATIVE Final    Comment:        The GeneXpert MRSA Assay (FDA approved for  NASAL specimens only), is one component of a comprehensive MRSA colonization surveillance program. It is not intended  to diagnose MRSA infection nor to guide or monitor treatment for MRSA infections.   Culture, blood (x 2)     Status: None (Preliminary result)   Collection Time: 06/25/15 11:06 PM  Result Value Ref Range Status   Specimen Description LEFT ANTECUBITAL  Final   Special Requests BOTTLES DRAWN AEROBIC ONLY 4CC  Final   Culture NO GROWTH 3 DAYS  Final   Report Status PENDING  Incomplete  Culture, blood (x 2)     Status: None (Preliminary result)   Collection Time: 06/25/15 11:16 PM  Result Value Ref Range Status   Specimen Description RIGHT ANTECUBITAL  Final   Special Requests IN PEDIATRIC BOTTLE 3CC  Final   Culture NO GROWTH 3 DAYS  Final   Report Status PENDING  Incomplete     Studies: Dg Chest Port 1 View  06/28/2015  CLINICAL DATA:  Acute onset of dyspnea.  Initial encounter. EXAM: PORTABLE CHEST 1 VIEW COMPARISON:  Chest radiograph from 06/25/2015 FINDINGS: The lungs are hypoexpanded. Vascular congestion is noted, with mildly increased interstitial markings, concerning for mild interstitial edema. No definite pleural effusion or pneumothorax is seen. The cardiomediastinal silhouette is enlarged. No acute osseous abnormalities are seen. IMPRESSION: Lungs hypoexpanded. Vascular congestion and cardiomegaly, with mildly increased interstitial markings, concerning for mild interstitial edema. Electronically Signed   By: Roanna RaiderJeffery  Chang M.D.   On: 06/28/2015 06:21   Dg Fluoro Guide Lumbar Puncture  06/27/2015  CLINICAL DATA:  65 year old female inpatient with sepsis, fevers and acute encephalopathy concerning for meningoencephalitis. EXAM: DIAGNOSTIC LUMBAR PUNCTURE UNDER FLUOROSCOPIC GUIDANCE FLUOROSCOPY TIME:  Fluoroscopy Time (in minutes and seconds): 18 second Number of Acquired Images:  1 image capture PROCEDURE: Informed consent was obtained from the patient's family prior to the procedure, including potential complications of headache, allergy, and pain. Under fluoroscopy, the L4-5 level  was chosen for lumbar puncture. With the patient prone, the lower back was prepped with Betadine. The patient was non-communicative and not alert to person, place or time and demonstrated significant agitation as soon as the lower back was prepped with Betadine. Local anesthesia was attempted with 1% Lidocaine, however the patient's degree of agitation (despite pre-treatment with haldol and benzodiazepine) precluded safe performance of the procedure and the decision was made to discontinue the procedure. The spinal needle was not used. IMPRESSION: Lumbar puncture discontinued prior to spinal needle usage due to patient agitation. These results were called by telephone at the time of interpretation on 06/27/2015 at 3:24 pm to Dr. Lynden OxfordPRANAV Zeenat Jeanbaptiste , who verbally acknowledged these results. Electronically Signed   By: Delbert PhenixJason A Poff M.D.   On: 06/27/2015 15:44     Scheduled Meds: . acyclovir  600 mg Intravenous 3 times per day  . antiseptic oral rinse  7 mL Mouth Rinse BID  . cefTRIAXone (ROCEPHIN)  IV  2 g Intravenous Q12H  . insulin aspart  0-15 Units Subcutaneous Q4H  . insulin glargine  25 Units Subcutaneous QHS  . sodium chloride  3 mL Intravenous Q12H  . vancomycin  1,000 mg Intravenous Q12H   Continuous Infusions:   PRN Meds: acetaminophen **OR** acetaminophen, haloperidol lactate, morphine injection, ondansetron **OR** ondansetron (ZOFRAN) IV  Time spent: 30 minutes  Author: Lynden OxfordPranav Advaith Lamarque, MD [facility] Pager: [phone] 06/28/2015 2:07 PM  If 7PM-7AM, please contact night-coverage at [email or web address], password [email or web address]

## 2015-06-28 NOTE — Significant Event (Signed)
Rapid Response Event Note  Overview: Time Called: 2025 Arrival Time: 2027 Event Type: Neurologic  Initial Focused Assessment: Unresponsive and diaphoretic    Interventions: EKG recommended     Event Summary: Called to assist with care of unresponsive patient. On arrival, patient was diaphoretic, had labored breathing, and very lethargic. Airway was patient. Pulses were present. Skin was warm and moist. Responds to sternal rub with facial grimace. Not following any commands. Noted HR 120-130 sinus tachycardia. Respiratory rate 30. O2 sats 96 on [place] 6L/mins. Patient appears postictal current symptoms. After 15 minutes, patient became more responsive nodding her head to simples questions. Anell BarrSteve Callahan, NP also at bedside. No RRT interventions needed at this time.        [place]

## 2015-06-28 NOTE — Progress Notes (Signed)
Interval history  :                                                                                                                                       Valerie Wagner is an 65 y.o. female patient  with  Toxic metabolic encephalopathy. Patient is more alert today during my evaluation followed commands, denies distress or pain.  She does have some fluctuation mental status, as reported by nursing staff. At times, she is more alert, and sometimes, poorly responsive. No sz like episodes.  MRI brain neg for acute pathology.  EEG showed e/o encephalopathy, no sz..    Past medical history: Past Medical History  Diagnosis Date  . Chest pain     Reassuring workup of her time including negative cardiac catheterization and stress testing  . Type 2 diabetes mellitus   . Mixed hyperlipidemia   . Obesity   . Essential hypertension, benign   . COPD (chronic obstructive pulmonary disease)   . DJD (degenerative joint disease)   . Urinary incontinence   . GERD (gastroesophageal reflux disease)   . IBS (irritable bowel syndrome)   . Fibromyalgia   . Asthma   . Neuropathy     Past Surgical History  Procedure Laterality Date  . Tonsillectomy    . Hysterectomy -- unknown    . Cholecystectomy    . Carpal tunnel release      Right  . Left thumb surgery    . Cesarean section      Family History: Family History  Problem Relation Age of Onset  . Coronary artery disease Father   . Heart attack Father     Died age 75    Social History:   reports that she quit smoking about 33 years ago. Her smoking use included Cigarettes. She has a 2 pack-year smoking history. She has never used smokeless tobacco. She reports that she does not drink alcohol or use illicit drugs.  Allergies:  Allergies  Allergen Reactions  . Nsaids Shortness Of Breath  . Bee Venom   . Chocolate   . Darvon [Propoxyphene]   . Ivp Dye [Iodinated Diagnostic Agents] Swelling  . Doxycycline Rash  . Penicillins Rash     Medications:   Current facility-administered medications:  .  acetaminophen (TYLENOL) tablet 650 mg, 650 mg, Oral, Q6H PRN **OR** acetaminophen (TYLENOL) suppository 650 mg, 650 mg, Rectal, Q6H PRN, Rolly Salter, MD, 650 mg at 06/28/15 0016 .  acyclovir (ZOVIRAX) 600 mg in dextrose 5 % 100 mL IVPB, 600 mg, Intravenous, 3 times per day, Earnie Larsson, RPH, 600 mg at 06/28/15 2129 .  antiseptic oral rinse (CPC / CETYLPYRIDINIUM CHLORIDE 0.05%) solution 7 mL, 7 mL, Mouth Rinse, BID, Rolly Salter, MD, 7 mL at 06/28/15 2200 .  cefTRIAXone (ROCEPHIN) 2 g in dextrose 5 % 50 mL IVPB, 2 g, Intravenous, Q12H, Rolly Salter, MD,  2 g at 06/28/15 1743 .  haloperidol lactate (HALDOL) injection 1 mg, 1 mg, Intravenous, Q6H PRN, Rolly Salter, MD, 1 mg at 06/27/15 1409 .  insulin aspart (novoLOG) injection 0-15 Units, 0-15 Units, Subcutaneous, Q4H, Rolly Salter, MD, 5 Units at 06/28/15 2125 .  insulin glargine (LANTUS) injection 25 Units, 25 Units, Subcutaneous, QHS, Rolly Salter, MD, 25 Units at 06/28/15 2130 .  levETIRAcetam (KEPPRA) 500 mg in sodium chloride 0.9 % 100 mL IVPB, 500 mg, Intravenous, Q12H, Rolan Lipa, NP .  morphine 2 MG/ML injection 1-2 mg, 1-2 mg, Intravenous, Q4H PRN, Rolly Salter, MD, 2 mg at 06/27/15 0033 .  ondansetron (ZOFRAN) tablet 4 mg, 4 mg, Oral, Q6H PRN **OR** ondansetron (ZOFRAN) injection 4 mg, 4 mg, Intravenous, Q6H PRN, Clydia Llano, MD, 4 mg at 06/26/15 0551 .  sodium chloride 0.9 % injection 3 mL, 3 mL, Intravenous, Q12H, Clydia Llano, MD, 3 mL at 06/28/15 2129 .  vancomycin (VANCOCIN) IVPB 1000 mg/200 mL premix, 1,000 mg, Intravenous, Q12H, Earnie Larsson, RPH, 1,000 mg at 06/28/15 1330   Neurologic Examination:                                                                                                      Blood pressure 150/84, pulse 78, temperature 100.6 F (38.1 C), temperature source Core (Comment), resp. rate 26, height 5\' 4"  (1.626  m), weight 125.7 kg (277 lb 1.9 oz), SpO2 95 %.  Evaluation of higher integrative functions including: Level of alertness: Alert,  oriented to self, place, people. Not to time.   Speech: able to speak fluently when she answered simple questions. No aphasia noted. Follows commands.  Test the following cranial nerves: 2-12 grossly intact Motor examination: Normal tone, bulk, symmetric sustained antigravity motor strength in all 4 extremities Examination of sensation :  symmetric b/l.  No abnormal involuntary movements or tremors noted.     Lab Results: Basic Metabolic Panel:  Recent Labs Lab 06/25/15 2306 06/26/15 0420 06/27/15 0340 06/28/15 0402 06/28/15 1623  NA 138 139 137 139 141  K 3.6 4.8 3.7 2.9* 4.7  CL 104 108 102 100* 103  CO2 20* 20* 23 28 24   GLUCOSE 311* 282* 264* 201* 216*  BUN 7 6 <5* 6 8  CREATININE 0.89 0.77 0.68 0.66 0.80  CALCIUM 8.7* 8.5* 8.8* 9.1 9.6  MG  --   --   --   --  2.0    Liver Function Tests:  Recent Labs Lab 06/25/15 1105 06/25/15 2306 06/28/15 0402  AST 38 32 45*  ALT 36 30 36  ALKPHOS 81 65 51  BILITOT 0.9 0.6 0.6  PROT 8.1 6.5 6.2*  ALBUMIN 4.5 3.6 3.0*   No results for input(s): LIPASE, AMYLASE in the last 168 hours.  Recent Labs Lab 06/26/15 1355  AMMONIA 44*    CBC:  Recent Labs Lab 06/25/15 1105 06/25/15 2306 06/26/15 1355 06/27/15 0340 06/28/15 0402  WBC 19.3* 16.4* 13.8* 15.7* 13.0*  NEUTROABS 16.7* 14.5* 11.3*  --  8.9*  HGB 15.6* 12.7  12.0 13.4 12.9  HCT 45.1 39.3 36.2 39.9 37.0  MCV 90.7 90.1 89.8 90.3 89.4  PLT 319 254 229 220 242    Cardiac Enzymes:  Recent Labs Lab 06/25/15 1933  TROPONINI 0.03    Lipid Panel: No results for input(s): CHOL, TRIG, HDL, CHOLHDL, VLDL, LDLCALC in the last 168 hours.  CBG:  Recent Labs Lab 06/28/15 0852 06/28/15 1225 06/28/15 1736 06/28/15 2009 06/28/15 2027  GLUCAP 209* 204* 226* 187* 211*    Microbiology: Results for orders placed or performed  during the hospital encounter of 06/25/15  Blood Culture (routine x 2)     Status: None (Preliminary result)   Collection Time: 06/25/15 11:05 AM  Result Value Ref Range Status   Specimen Description BLOOD LEFT ANTECUBITAL DRAWN BY RN  Final   Special Requests BOTTLES DRAWN AEROBIC AND ANAEROBIC 6CC EACH  Final   Culture NO GROWTH 3 DAYS  Final   Report Status PENDING  Incomplete  Urine culture     Status: None   Collection Time: 06/25/15 11:06 AM  Result Value Ref Range Status   Specimen Description URINE, CATHETERIZED  Final   Special Requests NONE  Final   Culture   Final    NO GROWTH 1 DAY Performed at [facility]    Report Status 06/26/2015 FINAL  Final  Blood Culture (routine x 2)     Status: None (Preliminary result)   Collection Time: 06/25/15 12:21 PM  Result Value Ref Range Status   Specimen Description BLOOD LEFT HAND  Final   Special Requests BOTTLES DRAWN AEROBIC ONLY 4CC  Final   Culture NO GROWTH 3 DAYS  Final   Report Status PENDING  Incomplete  MRSA PCR Screening     Status: None   Collection Time: 06/25/15  9:08 PM  Result Value Ref Range Status   MRSA by PCR NEGATIVE NEGATIVE Final    Comment:        The GeneXpert MRSA Assay (FDA approved for NASAL specimens only), is one component of a comprehensive MRSA colonization surveillance program. It is not intended to diagnose MRSA infection nor to guide or monitor treatment for MRSA infections.   Culture, blood (x 2)     Status: None (Preliminary result)   Collection Time: 06/25/15 11:06 PM  Result Value Ref Range Status   Specimen Description LEFT ANTECUBITAL  Final   Special Requests BOTTLES DRAWN AEROBIC ONLY 4CC  Final   Culture NO GROWTH 3 DAYS  Final   Report Status PENDING  Incomplete  Culture, blood (x 2)     Status: None (Preliminary result)   Collection Time: 06/25/15 11:16 PM  Result Value Ref Range Status   Specimen Description RIGHT ANTECUBITAL  Final   Special Requests IN PEDIATRIC  BOTTLE 3CC  Final   Culture NO GROWTH 3 DAYS  Final   Report Status PENDING  Incomplete     Imaging: Ct Abdomen Pelvis Wo Contrast  06/25/2015  CLINICAL DATA:  Found on the floor unresponsive at 10 a.m. EXAM: CT ABDOMEN AND PELVIS WITHOUT CONTRAST TECHNIQUE: Multidetector CT imaging of the abdomen and pelvis was performed following the standard protocol without IV contrast. COMPARISON:  10/14/2013, 05/20/2014 FINDINGS: Lower chest:  Clear lung bases.  Normal heart size. Hepatobiliary: Normal liver.  Prior cholecystectomy. Pancreas: Normal. Spleen: Normal. Adrenals/Urinary Tract: Normal adrenal glands. 12 mm exophytic hypodense, fluid attenuating right renal mass most consistent with a cyst. No urolithiasis or obstructive uropathy. Decompressed bladder with a Foley catheter  present. Stomach/Bowel: No bowel wall thickening or dilatation. Small hiatal hernia. No pneumatosis, pneumoperitoneum or portal venous gas. No abdominal pelvic free fluid. Vascular/Lymphatic: Normal caliber abdominal aorta with mild atherosclerosis. No lymphadenopathy. Reproductive: No adnexal mass.  Prior hysterectomy. Musculoskeletal: No acute osseous abnormality. No lytic or sclerotic osseous lesion. There is a 2.3 cm hypodense, fluid attenuating partially visualized mass in the left adductor brevis muscle concerning for a liquified hematoma versus ganglion cyst, but this is incompletely characterized. IMPRESSION: 1. No acute abdominal or pelvic pathology. 2. 2.3 cm hypodense, fluid attenuating partially visualized mass in the left adductor brevis muscle concerning for a liquified hematoma versus ganglion cyst, but this is incompletely characterized. This is unchanged compared with 05/20/2014. Electronically Signed   By: Elige Ko   On: 06/25/2015 12:25   Ct Head Wo Contrast  06/25/2015  CLINICAL DATA:  Found on floor.  Altered mental status EXAM: CT HEAD WITHOUT CONTRAST TECHNIQUE: Contiguous axial images were obtained from the  base of the skull through the vertex without intravenous contrast. COMPARISON:  MRI 01/22/2015 FINDINGS: Image quality degraded by significant motion. This study was repeated however there is persistent motion on the repeat exam Ventricle size normal. Negative for acute infarct. Negative for acute hemorrhage or mass. Subtle findings could be missed on the study given the amount of motion. IMPRESSION: Image quality degraded by significant motion. No acute abnormality. Repeat study recommended if symptoms persist. Electronically Signed   By: Marlan Palau M.D.   On: 06/25/2015 12:24   Mr Brain Wo Contrast  06/27/2015  CLINICAL DATA:  Found down by husband yesterday morning, poorly responsive. Febrile and hypotensive. History of diabetes, hyperlipidemia. EXAM: MRI HEAD WITHOUT CONTRAST TECHNIQUE: Multiplanar, multiecho pulse sequences of the brain and surrounding structures were obtained without intravenous contrast. COMPARISON:  CT head June 25, 2015 and MRI of the brain January 22, 2015 FINDINGS: Mildly motion degraded examination. The ventricles and sulci are normal for patient's age. No abnormal parenchymal signal, mass lesions, mass effect. No reduced diffusion to suggest acute ischemia. No susceptibility artifact to suggest hemorrhage. No abnormal extra-axial fluid collections. No extra-axial masses though, contrast enhanced sequences would be more sensitive. Normal major intracranial vascular flow voids seen at the skull base. Ocular globes and orbital contents are unremarkable though not tailored for evaluation. No abnormal sellar expansion. No suspicious calvarial bone marrow signal. Craniocervical junction maintained. Small RIGHT maxillary mucosal retention cyst. Small bilateral mastoid effusions. Is LEFT parotid gland appears enlarged with a 15 mm focus of low T2 signal associated with reduced diffusion. IMPRESSION: Negative motion degraded noncontrast MRI brain. LEFT parotid gland may be enlarged, with  superimposed 15 mm possible hematoma or mass. Recommend follow-up evaluation on a nonemergent basis. Electronically Signed   By: Awilda Metro M.D.   On: 06/27/2015 03:23   Dg Chest Port 1 View  06/28/2015  CLINICAL DATA:  Acute onset of dyspnea.  Initial encounter. EXAM: PORTABLE CHEST 1 VIEW COMPARISON:  Chest radiograph from 06/25/2015 FINDINGS: The lungs are hypoexpanded. Vascular congestion is noted, with mildly increased interstitial markings, concerning for mild interstitial edema. No definite pleural effusion or pneumothorax is seen. The cardiomediastinal silhouette is enlarged. No acute osseous abnormalities are seen. IMPRESSION: Lungs hypoexpanded. Vascular congestion and cardiomegaly, with mildly increased interstitial markings, concerning for mild interstitial edema. Electronically Signed   By: Roanna Raider M.D.   On: 06/28/2015 06:21   Dg Chest Port 1 View  06/25/2015  CLINICAL DATA:  Sepsis.  Emesis immediately prior to  the exam. EXAM: PORTABLE CHEST 1 VIEW COMPARISON:  Earlier this day at 1743 hour FINDINGS: Cardiomegaly and vascular congestion, unchanged allowing for differences in technique. No consolidation to suggest pneumonia. Unchanged elevation right hemidiaphragm. No pleural effusion or pneumothorax. IMPRESSION: Unchanged cardiomegaly and vascular congestion. No new abnormality is seen. Electronically Signed   By: Rubye OaksMelanie  Ehinger M.D.   On: 06/25/2015 22:43   Dg Chest Port 1 View  06/25/2015  CLINICAL DATA:  Altered mental status. EXAM: PORTABLE CHEST 1 VIEW COMPARISON:  01/21/2015 FINDINGS: Cardiac enlargement with vascular congestion. Mild right lower lobe atelectasis. No effusion. IMPRESSION: Cardiac enlargement with vascular congestion Mild right lower lobe atelectasis. Electronically Signed   By: Marlan Palauharles  Clark M.D.   On: 06/25/2015 11:44   Dg Chest Port 1v Same Day  06/25/2015  CLINICAL DATA:  Patient was found unresponsive this morning. New onset of wheezing and vomiting  today. EXAM: PORTABLE CHEST 1 VIEW COMPARISON:  January 5th 05/09/2016 1 a.m. FINDINGS: The heart size and mediastinal contours are stable. Heart size is enlarged. There is central pulmonary vascular congestion. There is no focal pneumonia or pleural effusion. The visualized skeletal structures are stable. IMPRESSION: Cardiomegaly and central pulmonary vascular congestion not changed. Electronically Signed   By: Sherian ReinWei-Chen  Lin M.D.   On: 06/25/2015 17:58   Dg Fluoro Guide Lumbar Puncture  06/27/2015  CLINICAL DATA:  65 year old female inpatient with sepsis, fevers and acute encephalopathy concerning for meningoencephalitis. EXAM: DIAGNOSTIC LUMBAR PUNCTURE UNDER FLUOROSCOPIC GUIDANCE FLUOROSCOPY TIME:  Fluoroscopy Time (in minutes and seconds): 18 second Number of Acquired Images:  1 image capture PROCEDURE: Informed consent was obtained from the patient's family prior to the procedure, including potential complications of headache, allergy, and pain. Under fluoroscopy, the L4-5 level was chosen for lumbar puncture. With the patient prone, the lower back was prepped with Betadine. The patient was non-communicative and not alert to person, place or time and demonstrated significant agitation as soon as the lower back was prepped with Betadine. Local anesthesia was attempted with 1% Lidocaine, however the patient's degree of agitation (despite pre-treatment with haldol and benzodiazepine) precluded safe performance of the procedure and the decision was made to discontinue the procedure. The spinal needle was not used. IMPRESSION: Lumbar puncture discontinued prior to spinal needle usage due to patient agitation. These results were called by telephone at the time of interpretation on 06/27/2015 at 3:24 pm to Dr. Lynden OxfordPRANAV PATEL , who verbally acknowledged these results. Electronically Signed   By: Delbert PhenixJason A Poff M.D.   On: 06/27/2015 15:44     Assessment and plan:   Limmie Patriciaonya Regal is an 65 y.o. female patient  With  improving encephalopathy. Neg mri brain and EEG. LP attempted, was not possible due to agitation. Meningitis in DD, On empiric antibiotics, continues to have fever. ID involved.  More alert, and follows commands today, which is an improvement.  Will f/u.

## 2015-06-28 NOTE — Progress Notes (Signed)
SLP Cancellation Note  Patient Details Name: Valerie Wagner MRN: [id number] DOB: June 06, 1951   Cancelled treatment:       Reason Eval/Treat Not Completed: Medical issues which prohibited therapy (Per RN, not alert enough for eval. )  Ferdinand LangoLeah Gaudencio Chesnut MA, CCC-SLP [phone]  Ferdinand LangoMcCoy Jaidev Sanger Valerie Wagner 06/28/2015, 8:59 AM

## 2015-06-28 NOTE — Progress Notes (Signed)
Call received per floor RN at 0530 regarding Pt with acute change in status. Upon my arrival at 0533 Pt found resting in bed, using accessory muscles, paradoxical breathing, and snoring respirations. Lung sounds diminshed with expiratory wheeze, weak gag reflex.  HR 120-130 ST with frequent PVCs, RR 35-45, BP 174/90. Flails arms to sternal rub. Pupils size 4, sluggish reaction.  Per floor RN at 0230 Pt was arousable and able to answer some questions. [facility] NP T. Claiborne Billingsallahan paged and updated per floor RN. ABG ordered and completed STAT. AM labs already drawn and pending. While awaiting results Pt RR, HR and BP improved. As of 0605 Pt awakens to voice, opens eyes but does not follow commands. Pt appears possibly post ictal. She is able to track across the room. ABG results Ph 7.4 CO2 34.9 O2 60 Bicarb 23. NP paged and updated on Pt status. Pt VS back to baseline, left resting in bed. RN advised to monitor Pt closely and notify Provider and myself for worsening changes.

## 2015-06-28 NOTE — Progress Notes (Signed)
[facility]    Date of Admission:  06/25/2015   Total days of antibiotics 4        Day 4 vanco        Day 4 ceftriaxone        Day 4 acyclovir   ID: Valerie Wagner is a 65 y.o. female with acute encephalopathy and fevers, concerning for meningoencephalitis Principal Problem:   Acute encephalopathy Active Problems:   Essential hypertension, benign   Severe sepsis (HCC)   Somnolence   Chronic pain   Type 2 diabetes mellitus (HCC)   Polyneuropathy (HCC)   Severe obesity (BMI >= 40) (HCC)   Altered mental state   Sepsis (HCC)   Type 2 diabetes mellitus without complication, without long-term current use of insulin (HCC)   Fever    Subjective/interval hx: Afebrile this morning but up to 102F yesterday, did not undergo LP yet. Required rapid response in the early morning due to having respiratory distress, in setting tachycardia, short lasting. Her mental status relatively unchanged, still far from baseline, remains encephalopathic, opens eyes to name but does not answer questions. She had EEG yesterday read pending  Medications:  . acyclovir  600 mg Intravenous 3 times per day  . antiseptic oral rinse  7 mL Mouth Rinse BID  . cefTRIAXone (ROCEPHIN)  IV  2 g Intravenous Q12H  . insulin aspart  0-15 Units Subcutaneous Q4H  . insulin glargine  25 Units Subcutaneous QHS  . potassium chloride  10 mEq Intravenous Q1 Hr x 4  . sodium chloride  3 mL Intravenous Q12H  . vancomycin  1,000 mg Intravenous Q12H    Objective: Vital signs in last 24 hours: Temp:  [100.4 F (38 C)-102 F (38.9 C)] 100.8 F (38.2 C) (01/08 0853) Pulse Rate:  [66-126] 73 (01/08 0853) Resp:  [16-39] 24 (01/08 0853) BP: (129-174)/(57-99) 157/86 mmHg (01/08 0853) SpO2:  [89 %-98 %] 97 % (01/08 0853) Physical Exam  Constitutional:  Opens eyes to name. appears diaphoretic. No distress.  HENT: [place]/AT, PERRLA, no scleral icterus Mouth/Throat: dry dark/brown coating Cardiovascular:  Normal rate, regular rhythm and normal heart sounds. Exam reveals no gallop and no friction rub.  No murmur heard.  Pulmonary/Chest: Effort normal and breath sounds normal. No respiratory distress.  has no wheezes.  Neck = supple, no nuchal rigidity Abdominal: Soft. Bowel sounds are normal.  exhibits no distension. There is no tenderness.  Lymphadenopathy: no cervical adenopathy. No axillary adenopathy Neurological: moves all extremities spontaneously, though does not answer questions. eyes open, blank stare. Skin: Skin is warm and dry. No rash noted. No erythema.  Psychiatric: sleeping   Lab Results  Recent Labs  06/27/15 0340 06/28/15 0402  WBC 15.7* 13.0*  HGB 13.4 12.9  HCT 39.9 37.0  NA 137 139  K 3.7 2.9*  CL 102 100*  CO2 23 28  BUN <5* 6  CREATININE 0.68 0.66   Liver Panel  Recent Labs  06/25/15 2306 06/28/15 0402  PROT 6.5 6.2*  ALBUMIN 3.6 3.0*  AST 32 45*  ALT 30 36  ALKPHOS 65 51  BILITOT 0.6 0.6   Microbiology: 1/5 blood cx x 4 1/4 urine cx pending  Studies/Results: Mr Brain Wo Contrast  06/27/2015  CLINICAL DATA:  Found down by husband yesterday morning, poorly responsive. Febrile and hypotensive. History of diabetes, hyperlipidemia. EXAM: MRI HEAD WITHOUT CONTRAST TECHNIQUE: Multiplanar, multiecho pulse sequences of the brain and surrounding structures were obtained without intravenous contrast. COMPARISON:  CT head June 25, 2015 and MRI of the brain January 22, 2015 FINDINGS: Mildly motion degraded examination. The ventricles and sulci are normal for patient's age. No abnormal parenchymal signal, mass lesions, mass effect. No reduced diffusion to suggest acute ischemia. No susceptibility artifact to suggest hemorrhage. No abnormal extra-axial fluid collections. No extra-axial masses though, contrast enhanced sequences would be more sensitive. Normal major intracranial vascular flow voids seen at the skull base. Ocular globes and orbital contents are  unremarkable though not tailored for evaluation. No abnormal sellar expansion. No suspicious calvarial bone marrow signal. Craniocervical junction maintained. Small RIGHT maxillary mucosal retention cyst. Small bilateral mastoid effusions. Is LEFT parotid gland appears enlarged with a 15 mm focus of low T2 signal associated with reduced diffusion. IMPRESSION: Negative motion degraded noncontrast MRI brain. LEFT parotid gland may be enlarged, with superimposed 15 mm possible hematoma or mass. Recommend follow-up evaluation on a nonemergent basis. Electronically Signed   By: Awilda Metroourtnay  Bloomer M.D.   On: 06/27/2015 03:23   Dg Chest Port 1 View  06/28/2015  CLINICAL DATA:  Acute onset of dyspnea.  Initial encounter. EXAM: PORTABLE CHEST 1 VIEW COMPARISON:  Chest radiograph from 06/25/2015 FINDINGS: The lungs are hypoexpanded. Vascular congestion is noted, with mildly increased interstitial markings, concerning for mild interstitial edema. No definite pleural effusion or pneumothorax is seen. The cardiomediastinal silhouette is enlarged. No acute osseous abnormalities are seen. IMPRESSION: Lungs hypoexpanded. Vascular congestion and cardiomegaly, with mildly increased interstitial markings, concerning for mild interstitial edema. Electronically Signed   By: Roanna RaiderJeffery  Chang M.D.   On: 06/28/2015 06:21   Dg Fluoro Guide Lumbar Puncture  06/27/2015  CLINICAL DATA:  65 year old female inpatient with sepsis, fevers and acute encephalopathy concerning for meningoencephalitis. EXAM: DIAGNOSTIC LUMBAR PUNCTURE UNDER FLUOROSCOPIC GUIDANCE FLUOROSCOPY TIME:  Fluoroscopy Time (in minutes and seconds): 18 second Number of Acquired Images:  1 image capture PROCEDURE: Informed consent was obtained from the patient's family prior to the procedure, including potential complications of headache, allergy, and pain. Under fluoroscopy, the L4-5 level was chosen for lumbar puncture. With the patient prone, the lower back was prepped with  Betadine. The patient was non-communicative and not alert to person, place or time and demonstrated significant agitation as soon as the lower back was prepped with Betadine. Local anesthesia was attempted with 1% Lidocaine, however the patient's degree of agitation (despite pre-treatment with haldol and benzodiazepine) precluded safe performance of the procedure and the decision was made to discontinue the procedure. The spinal needle was not used. IMPRESSION: Lumbar puncture discontinued prior to spinal needle usage due to patient agitation. These results were called by telephone at the time of interpretation on 06/27/2015 at 3:24 pm to Dr. Lynden OxfordPRANAV PATEL , who verbally acknowledged these results. Electronically Signed   By: Delbert PhenixJason A Poff M.D.   On: 06/27/2015 15:44   Per my review of MRI, no changes to temporal lobs.   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 It would be of value to help possibly with de-escalation of antibiotics. Continue with current abtx.   Encephalopathy- appears slightly worse than yesterday. Not much progress despite being on abtx. Await eeg results to see if she is having non-convulsive seizures.  Fever = continue to monitor to see if trends down.  Parotid inflammation on imaging = does not correlate to exam, though will keep monitoring    Andrei Mccook, [facility] Cell: [phone] Pager: [phone]  06/28/2015, 10:55 AM

## 2015-06-28 NOTE — Progress Notes (Signed)
Upon entering pts room, pt was having paradoxical belly breathing, making loud grunting noises, unreponsive to sternal rub, new bigeminy and HR in the 130's. This was a very sudden change in pt presentation, Rapid was called, paged TRH, and RT came to draw a blood gas. Ph  7.4 CO2 34.9 O2 60 Bicarb 23. Now, pt opens eyes to sternal rub, and has a pot-ictal look. Possibly seizure?  RN will continue to monitor.

## 2015-06-28 NOTE — Progress Notes (Signed)
Pt opened her eyes to voice and responded to my questions. She stated her name and DOB, but  she did not know where she was or month/year.  I continued to ask her questions but she was simply staring and then went back to sleep. She continues to have a fever of 101-102 despite tylenol.

## 2015-06-29 ENCOUNTER — Inpatient Hospital Stay (HOSPITAL_COMMUNITY): Payer: Medicaid Other

## 2015-06-29 ENCOUNTER — Encounter (HOSPITAL_COMMUNITY): Payer: Self-pay | Admitting: General Practice

## 2015-06-29 DIAGNOSIS — G0490 Encephalitis and encephalomyelitis, unspecified: Secondary | ICD-10-CM

## 2015-06-29 LAB — GLUCOSE, CAPILLARY
GLUCOSE-CAPILLARY: 150 mg/dL — AB (ref 65–99)
GLUCOSE-CAPILLARY: 137 mg/dL — AB (ref 65–99)
GLUCOSE-CAPILLARY: 145 mg/dL — AB (ref 65–99)
GLUCOSE-CAPILLARY: 165 mg/dL — AB (ref 65–99)
GLUCOSE-CAPILLARY: 132 mg/dL — AB (ref 65–99)
Glucose-Capillary: 157 mg/dL — ABNORMAL HIGH (ref 65–99)

## 2015-06-29 LAB — CBC
HEMATOCRIT: 40.1 % (ref 36.0–46.0)
HEMOGLOBIN: 14 g/dL (ref 12.0–15.0)
MCH: 31 pg (ref 26.0–34.0)
MCHC: 34.9 g/dL (ref 30.0–36.0)
MCV: 88.7 fL (ref 78.0–100.0)
Platelets: 241 10*3/uL (ref 150–400)
RBC: 4.52 MIL/uL (ref 3.87–5.11)
RDW: 12.3 % (ref 11.5–15.5)
WBC: 13.1 10*3/uL — AB (ref 4.0–10.5)

## 2015-06-29 LAB — BASIC METABOLIC PANEL
ANION GAP: 10 (ref 5–15)
BUN: 8 mg/dL (ref 6–20)
CALCIUM: 9.3 mg/dL (ref 8.9–10.3)
CO2: 27 mmol/L (ref 22–32)
CREATININE: 0.61 mg/dL (ref 0.44–1.00)
Chloride: 104 mmol/L (ref 101–111)
GLUCOSE: 148 mg/dL — AB (ref 65–99)
POTASSIUM: 3.5 mmol/L (ref 3.5–5.1)
Sodium: 141 mmol/L (ref 135–145)

## 2015-06-29 LAB — VANCOMYCIN, TROUGH: Vancomycin Tr: 8 ug/mL — ABNORMAL LOW (ref 10.0–20.0)

## 2015-06-29 MED ORDER — JEVITY 1.2 CAL PO LIQD
1000.0000 mL | ORAL | Status: DC
  Administered 2015-06-29: 1000 mL
  Filled 2015-06-29 (×3): qty 1000

## 2015-06-29 MED ORDER — JEVITY 1.2 CAL PO LIQD
1000.0000 mL | ORAL | Status: DC
  Filled 2015-06-29 (×3): qty 1000

## 2015-06-29 MED ORDER — VANCOMYCIN HCL IN DEXTROSE 1-5 GM/200ML-% IV SOLN
1000.0000 mg | Freq: Three times a day (TID) | INTRAVENOUS | Status: DC
  Administered 2015-06-29 – 2015-07-02 (×8): 1000 mg via INTRAVENOUS
  Filled 2015-06-29 (×11): qty 200

## 2015-06-29 NOTE — Procedures (Signed)
ELECTROENCEPHALOGRAM REPORT  Patient: Valerie Wagner Patient       Room #: [phone] EEG No. ID: [id number] Age: 65 y.o.        Sex: female Referring Physician: Leodis BinetPATEL, P Report Date:  06/29/2015        Interpreting Physician: Aline BrochureSTEWART,Jinny Sweetland R  History: Valerie Wagner is an 65 y.o. female admitted on 06/25/2015 with acute encephalopathy with associated sepsis. Patient remains encephalopathic with treatment with antibiotics as well as acyclovir.  Indications for study:  Assess severity of encephalopathy; rule out seizure activity.  Technique: This is an 18 channel routine scalp EEG performed at the bedside with bipolar and monopolar montages arranged in accordance to the international 10/20 system of electrode placement.   Description: Patient was awake and confused during this EEG recording. Predominant activity consisted of moderate to slightly high amplitude diffuse mixed delta and theta activity which was symmetrical and continuous. Photic stimulation was not performed. Significant muscle artifact occurred throughout this recording. No epileptiform discharges were recorded.  Interpretation: This EEG is abnormal with moderately severe generalized continuous nonspecific slowing of cerebral activity. This pattern of slowing can be seen with metabolic and toxic encephalopathic states, as well as with degenerative CNS disorders. No evidence of epileptic activity was recorded.   Venetia MaxonR Saydie Gerdts M.D. [facility] [phone]

## 2015-06-29 NOTE — Progress Notes (Addendum)
[facility] Progress Note  Patient: Valerie Wagner GNF:[id number]   PCP: Donzetta Sprung, MD DOB: 09-06-1950   DOA: 06/25/2015   DOS: 06/29/2015   Date of Service: the patient was seen and examined on 06/29/2015  Subjective: This morning the patient was able to carry out conversation, was also tracking with her eyes. Able to identify her family member as well. Nutrition: Nothing by mouth at present Activity: Mostly bedridden Last BM: 06/27/2015  Assessment and Plan: 1. Acute encephalopathy Suspected meningoencephalitis  Patient presented to [facility] with sudden onset of altered mental status. Patient husband found her on the floor not waking up. She did not have loss of pulse. EMS found her combative and she was given Versed. In the ER the patient was not responsive but was found to be having fever, lactic acidosis, leukocytosis. With this the patient was found to be meeting criteria for sepsis suspecting CNS infection. Infectious disease is consulted and the patient is currently on vancomycin and ceftriaxone, acyclovir. DAY 4 IR will be able to perform lumbar puncture under anesthesia guidance tomorrow at 9 AM. We will await the results  2. Acute encephalopathy. CT scan is unremarkable but it has been a motion degraded exam. MRI also unremarkable. Overnight the patient had another episode of worsening mentation and hypoxia. Patient was started on Keppra, in the morning the patient has significant improvement in her mental status. Neurology on board, repeat EEGs ordered.  3. Type 2 diabetes mellitus. Due to patient's nothing by mouth status insulin Lantus at a lower dose. Continue moderate sliding scale Every 4 hours. Sugars are in acceptable range  4. Essential hypertension. Holding antihypertensive medications at present. Blood pressure stable  5. Chronic pain with polyneuropathy. Patient is on chronic pain medication at home, will use when necessary morphine.  6.  Severe obesity. Patient may have obesity hypoventilation syndrome. May benefit from outpatient sleep study. ABG shows no hypercarbia  7. Hypoxia. This can also worsen patient's encephalopathy. Continue oxygen as needed. Critical care with peripherally follow the patient.Marland Kitchen  DVT Prophylaxis: subcutaneous Heparin  Nutrition: Nothing by mouth at present, day 4, since the patient is more awake we will reconsult speech therapy today. Holding off on initiating tube feeding due to expected improvement tomorrow. Addendum speech therapy recommends FEES evaluation, and only be complicated on 07/01/2015 at the earliest. Cor track ordered, tube feeding ordered. dietery consulted.   Advance goals of care discussion: Full code  Brief Summary of Hospitalization:  HPI: As per the H and P dictated on admission, "patient presented at [facility] with an episode of unresponsiveness. Was not waking up on verbal command did not lose pulse." Daily update, Procedures: 06/26/2015 unable to complete fluoroscopy guided lumbar puncture, MRI after sedation, EEG., 06/26/2025 MRI negative, lumbar puncture re ordered,  Consultants: Infectious disease, neurology, intervention radiology Antibiotics: Anti-infectives    Start     Dose/Rate Route Frequency Ordered Stop   06/26/15 1730  acyclovir (ZOVIRAX) 600 mg in dextrose 5 % 100 mL IVPB     600 mg 112 mL/hr over 60 Minutes Intravenous 3 times per day 06/26/15 1717     06/26/15 1600  ampicillin (OMNIPEN) 2 g in sodium chloride 0.9 % 50 mL IVPB  Status:  Discontinued     2 g 150 mL/hr over 20 Minutes Intravenous 6 times per day 06/26/15 1529 06/26/15 1658   06/26/15 1530  vancomycin (VANCOCIN) IVPB 1000 mg/200 mL premix  Status:  Discontinued     1,000 mg  200 mL/hr over 60 Minutes Intravenous  Once 06/26/15 1529 06/26/15 1533   06/26/15 1530  cefTRIAXone (ROCEPHIN) 2 g in dextrose 5 % 50 mL IVPB     2 g 100 mL/hr over 30 Minutes Intravenous Every 12 hours  06/26/15 1529     06/26/15 1000  levofloxacin (LEVAQUIN) IVPB 750 mg  Status:  Discontinued     750 mg 100 mL/hr over 90 Minutes Intravenous Every 24 hours 06/25/15 2052 06/26/15 1530   06/26/15 0200  vancomycin (VANCOCIN) IVPB 1000 mg/200 mL premix     1,000 mg 200 mL/hr over 60 Minutes Intravenous Every 12 hours 06/25/15 2052     06/25/15 2200  aztreonam (AZACTAM) 2 g in dextrose 5 % 50 mL IVPB  Status:  Discontinued     2 g 100 mL/hr over 30 Minutes Intravenous 3 times per day 06/25/15 2052 06/26/15 1530   06/25/15 2100  aztreonam (AZACTAM) 2 g in dextrose 5 % 50 mL IVPB  Status:  Discontinued     2 g 100 mL/hr over 30 Minutes Intravenous  Once 06/25/15 1625 06/25/15 2052   06/25/15 1430  acyclovir (ZOVIRAX) 600 mg in dextrose 5 % 100 mL IVPB     600 mg 112 mL/hr over 60 Minutes Intravenous  Once 06/25/15 1405 06/25/15 1601   06/25/15 1315  vancomycin (VANCOCIN) IVPB 1000 mg/200 mL premix    Comments:  ** Give 1000mg  IV x 2 bags total back to back to = total dose of 2000mg  **   1,000 mg 200 mL/hr over 60 Minutes Intravenous  Once 06/25/15 1115 06/25/15 1459   06/25/15 1215  vancomycin (VANCOCIN) IVPB 1000 mg/200 mL premix    Comments:  ** Give 1000mg  IV x 2 bags total back to back to = total dose of 2000mg  **   1,000 mg 200 mL/hr over 60 Minutes Intravenous  Once 06/25/15 1115 06/25/15 1403   06/25/15 1115  levofloxacin (LEVAQUIN) IVPB 750 mg     750 mg 100 mL/hr over 90 Minutes Intravenous  Once 06/25/15 1106 06/25/15 1336   06/25/15 1115  aztreonam (AZACTAM) 2 g in dextrose 5 % 50 mL IVPB     2 g 100 mL/hr over 30 Minutes Intravenous  Once 06/25/15 1106 06/25/15 1245   06/25/15 1115  vancomycin (VANCOCIN) IVPB 1000 mg/200 mL premix  Status:  Discontinued     1,000 mg 200 mL/hr over 60 Minutes Intravenous  Once 06/25/15 1106 06/25/15 1110      Family Communication: family at bedside at the time of interview. Questions answered satisfactorily.  Disposition:  Barriers to safe  discharge: Altered sensorium   Intake/Output Summary (Last 24 hours) at 06/29/15 1421 Last data filed at 06/29/15 1212  Gross per 24 hour  Intake    362 ml  Output   1600 ml  Net  -1238 ml   Filed Weights   06/25/15 1420 06/25/15 2104  Weight: 158.759 kg (350 lb) 125.7 kg (277 lb 1.9 oz)    Objective: Physical Exam: Filed Vitals:   06/29/15 0300 06/29/15 0400 06/29/15 0600 06/29/15 1209  BP: 146/81 150/84 135/85 131/73  Pulse: 79 74 74 67  Temp: 100.2 F (37.9 C) 100 F (37.8 C) 99.9 F (37.7 C) 98.2 F (36.8 C)  TempSrc:  Core (Comment)  Oral  Resp: 20 21 23 23   Height:      Weight:      SpO2: 96% 99% 98% 97%     General: Appear in mild distress, no  Rash; Oral Mucosa moist. Cardiovascular: S1 and S2 Present, no Murmur, no JVD Respiratory: Bilateral Air entry present and Clear to Auscultation, no Crackles, no wheezes Abdomen: Bowel Sound present, Soft and no tenderness Extremities: bilateral Pedal edema, no calf tenderness Neurology: Mental status verbalizing her name and follow command, able to carry out long conversation as well as track. Cranial Nerves PERRL,  Motor strength spontaneous movement of both extremities, right more than left Sensation present to painful stimuli reflexes  babinski unable to elicit  Data Reviewed: CBC:  Recent Labs Lab 06/25/15 1105 06/25/15 2306 06/26/15 1355 06/27/15 0340 06/28/15 0402 06/29/15 0327  WBC 19.3* 16.4* 13.8* 15.7* 13.0* 13.1*  NEUTROABS 16.7* 14.5* 11.3*  --  8.9*  --   HGB 15.6* 12.7 12.0 13.4 12.9 14.0  HCT 45.1 39.3 36.2 39.9 37.0 40.1  MCV 90.7 90.1 89.8 90.3 89.4 88.7  PLT 319 254 229 220 242 241   Basic Metabolic Panel:  Recent Labs Lab 06/26/15 0420 06/27/15 0340 06/28/15 0402 06/28/15 1623 06/29/15 0327  NA 139 137 139 141 141  K 4.8 3.7 2.9* 4.7 3.5  CL 108 102 100* 103 104  CO2 20* 23 28 24 27   GLUCOSE 282* 264* 201* 216* 148*  BUN 6 <5* 6 8 8   CREATININE 0.77 0.68 0.66 0.80 0.61    CALCIUM 8.5* 8.8* 9.1 9.6 9.3  MG  --   --   --  2.0  --    Liver Function Tests:  Recent Labs Lab 06/25/15 1105 06/25/15 2306 06/28/15 0402  AST 38 32 45*  ALT 36 30 36  ALKPHOS 81 65 51  BILITOT 0.9 0.6 0.6  PROT 8.1 6.5 6.2*  ALBUMIN 4.5 3.6 3.0*   No results for input(s): LIPASE, AMYLASE in the last 168 hours.  Recent Labs Lab 06/26/15 1355  AMMONIA 44*    Cardiac Enzymes:  Recent Labs Lab 06/25/15 1933  TROPONINI 0.03    BNP (last 3 results)  Recent Labs  06/26/15 1355  BNP 362.7*    CBG:  Recent Labs Lab 06/28/15 2027 06/29/15 0101 06/29/15 0411 06/29/15 0720 06/29/15 1206  GLUCAP 211* 150* 137* 157* 145*    Recent Results (from the past 240 hour(s))  Blood Culture (routine x 2)     Status: None (Preliminary result)   Collection Time: 06/25/15 11:05 AM  Result Value Ref Range Status   Specimen Description BLOOD LEFT ANTECUBITAL DRAWN BY RN  Final   Special Requests BOTTLES DRAWN AEROBIC AND ANAEROBIC 6CC EACH  Final   Culture NO GROWTH 4 DAYS  Final   Report Status PENDING  Incomplete  Urine culture     Status: None   Collection Time: 06/25/15 11:06 AM  Result Value Ref Range Status   Specimen Description URINE, CATHETERIZED  Final   Special Requests NONE  Final   Culture   Final    NO GROWTH 1 DAY Performed at [facility]    Report Status 06/26/2015 FINAL  Final  Blood Culture (routine x 2)     Status: None (Preliminary result)   Collection Time: 06/25/15 12:21 PM  Result Value Ref Range Status   Specimen Description BLOOD LEFT HAND  Final   Special Requests BOTTLES DRAWN AEROBIC ONLY 4CC  Final   Culture NO GROWTH 4 DAYS  Final   Report Status PENDING  Incomplete  MRSA PCR Screening     Status: None   Collection Time: 06/25/15  9:08 PM  Result Value Ref Range  Status   MRSA by PCR NEGATIVE NEGATIVE Final    Comment:        The GeneXpert MRSA Assay (FDA approved for NASAL specimens only), is one component of  a comprehensive MRSA colonization surveillance program. It is not intended to diagnose MRSA infection nor to guide or monitor treatment for MRSA infections.   Culture, blood (x 2)     Status: None (Preliminary result)   Collection Time: 06/25/15 11:06 PM  Result Value Ref Range Status   Specimen Description LEFT ANTECUBITAL  Final   Special Requests BOTTLES DRAWN AEROBIC ONLY 4CC  Final   Culture NO GROWTH 3 DAYS  Final   Report Status PENDING  Incomplete  Culture, blood (x 2)     Status: None (Preliminary result)   Collection Time: 06/25/15 11:16 PM  Result Value Ref Range Status   Specimen Description RIGHT ANTECUBITAL  Final   Special Requests IN PEDIATRIC BOTTLE 3CC  Final   Culture NO GROWTH 3 DAYS  Final   Report Status PENDING  Incomplete     Studies: No results found.   Scheduled Meds: . acyclovir  600 mg Intravenous 3 times per day  . antiseptic oral rinse  7 mL Mouth Rinse BID  . cefTRIAXone (ROCEPHIN)  IV  2 g Intravenous Q12H  . insulin aspart  0-15 Units Subcutaneous Q4H  . insulin glargine  25 Units Subcutaneous QHS  . levETIRAcetam  500 mg Intravenous Q12H  . sodium chloride  3 mL Intravenous Q12H  . vancomycin  1,000 mg Intravenous Q12H   Continuous Infusions:   PRN Meds: acetaminophen **OR** acetaminophen, haloperidol lactate, morphine injection, ondansetron **OR** ondansetron (ZOFRAN) IV  Time spent: 30 minutes  Author: Lynden Oxford, MD [facility] Pager: [phone] 06/29/2015 2:21 PM  If 7PM-7AM, please contact night-coverage at [email or web address], password [facility]

## 2015-06-29 NOTE — Progress Notes (Signed)
Rehab Admissions Coordinator Note:  Patient was screened by Amarisa Wilinski M for aTrish Mageppropriateness for an Inpatient Acute Rehab Consult.  Noted OT recommending CIR.  At this time, we are recommending Inpatient Rehab consult.  Trish MageLogue, Mellanie Bejarano M 06/29/2015, 12:03 PM  I can be reached at [phone].

## 2015-06-29 NOTE — Evaluation (Signed)
Occupational Therapy Evaluation Patient Details Name: Valerie Wagner MRN: [id number] DOB: 18-Oct-1950 Today's Date: 06/29/2015    History of Present Illness 65 yo F with fever, leukocytosis, delirium. She has now had multiple days of acyclovir, vancomycin, ceftriaxone with continued encephalopathy. With fevers and confusion, I do think that an infectious etiology is most likely. We could try her LP again, but am concerned that since it has not been able to be accomplished even with ativan previously, it may be difficult to do without more significant sedation. Past medical history of type 2 diabetes mellitus, essential hypertension, chronic back pain and obesity.    Clinical Impression   This 65 yo female admitted with above presents to acute OT with decreased balance, decreased mobility, morbid obesity, decreased cognition, decreased use of Bil UEs (L worse than R), all affecting her ability to care for herself and increasing burden of care on caregivers. She will benefit from acute OT with follow up OT on CIR to get to a min A to S level.     Follow Up Recommendations  CIR    Equipment Recommendations   (TBD at next venue)       Precautions / Restrictions Precautions Precautions: Fall Restrictions Weight Bearing Restrictions: No      Mobility Bed Mobility Overal bed mobility: Needs Assistance;+2 for physical assistance Bed Mobility: Supine to Sit;Sit to Supine     Supine to sit: Total assist;+2 for physical assistance;HOB elevated Sit to supine: Total assist;+2 for physical assistance   General bed mobility comments: Pt not able to assist much with supine to sit with therapist initiating with LEs and total assist for elevation of trunk with pt helping less than 15%.  Total assist to lie pt back down and to scoot up to [facility].  could not follow cues to roll.      Balance Overall balance assessment: Needs assistance;History of Falls Sitting-balance support: Single extremity  supported;Feet supported Sitting balance-Leahy Scale: Zero Sitting balance - Comments: Pt total assist to sit EOB most of time while sitting for 10 minutes.  Pt leaning to her right and was unable to shift weight to left side with cues and assist. Pt kicked left LE with trace movement to command and kicked right LE a little more.  Pt followed commands to shake hands with bil UEs with delayed response time of 5 seconds and could not reach very far to meet therapists hand.  Pt washed face with right UE to command after placing cloth in hand and giving cue 4 times.  Pt never ableto achieve full upright sitting.   Postural control: Right lateral lean;Posterior lean                                  ADL Overall ADL's : Needs assistance/impaired Eating/Feeding: NPO   Grooming: Wash/dry face;Maximal assistance Grooming Details (indicate cue type and reason): delayed command following and once she did with multi-modal cues (verbal, gestural, and textile) she only minmially washed her face Upper Body Bathing: Total assistance;Sitting   Lower Body Bathing: Total assistance;Bed level   Upper Body Dressing : Total assistance;Sitting   Lower Body Dressing: Total assistance;Bed level                       Vision Additional Comments: Pt was able to follow my pen in all directions, but overall she has inattention to her left side and tends  look to her right more than left (needs to be further tested)          Pertinent Vitals/Pain Pain Assessment: Faces Faces Pain Scale: Hurts little more Pain Location: generalized Pain Descriptors / Indicators: Grimacing;Guarding (with movment) Pain Intervention(s): Limited activity within patient's tolerance;Monitored during session;Repositioned     Hand Dominance Right   Extremity/Trunk Assessment Upper Extremity Assessment Upper Extremity Assessment: Generalized weakness;RUE deficits/detail;LUE deficits/detail RUE Deficits / Details:  RUE generalized weakness RUE Coordination: decreased fine motor;decreased gross motor LUE Deficits / Details: increased tone noted once pt up to EOB. Pt with inattention to LUE, will move it on command but slow and delayed and decreased strength overall with edema noted (propped up  on pillow) LUE Coordination: decreased fine motor;decreased gross motor   Lower Extremity Assessment Lower Extremity Assessment: LLE deficits/detail;RLE deficits/detail RLE Deficits / Details: noted extensor tone with movement to command.  strength grossly 3-/5 LLE Deficits / Details: strength grossly 2/5.   Cervical / Trunk Assessment Cervical / Trunk Assessment: Kyphotic   Communication Communication Communication: Expressive difficulties;Receptive difficulties (delayed)   Cognition Arousal/Alertness: Awake/alert Behavior During Therapy: Flat affect Overall Cognitive Status: Impaired/Different from baseline Area of Impairment: Orientation;Memory;Following commands;Awareness;Safety/judgement;Problem solving;Attention Orientation Level: Disoriented to;Situation;Place;Time Current Attention Level: Focused Memory: Decreased short-term memory Following Commands: Follows one step commands inconsistently;Follows one step commands with increased time Safety/Judgement: Decreased awareness of safety;Decreased awareness of deficits Awareness: Intellectual Problem Solving: Difficulty sequencing;Requires verbal cues;Requires tactile cues;Slow processing;Decreased initiation General Comments: Pt with delayed response time. Noted questionable inattention to left hemibody.      Exercises Exercises: General Lower Extremity          Home Living Family/patient expects to be discharged to:: Private residence Living Arrangements: Children;Spouse/significant other Available Help at Discharge: Family;Available PRN/intermittently Type of Home: House Home Access: Stairs to enter [employer] of Steps:  1 Entrance Stairs-Rails: None Home Layout: One level               Home Equipment: Cane - single point   Additional Comments: Pt questionable historian.  Confusion noted.      Prior Functioning/Environment Level of Independence: Independent        Comments: unsure as pt a questionable historian    OT Diagnosis: Generalized weakness;Cognitive deficits;Disturbance of vision;Hemiplegia non-dominant side   OT Problem List: Decreased strength;Decreased range of motion;Impaired balance (sitting and/or standing);Decreased activity tolerance;Impaired sensation;Pain;Impaired UE functional use;Obesity;Decreased knowledge of use of DME or AE;Impaired vision/perception;Decreased coordination;Decreased cognition;Decreased safety awareness;Impaired tone;Increased edema   OT Treatment/Interventions: Self-care/ADL training;Therapeutic exercise;Neuromuscular education;Therapeutic activities;DME and/or AE instruction;Cognitive remediation/compensation;Balance training;Patient/family education;Visual/perceptual remediation/compensation    OT Goals(Current goals can be found in the care plan section) Acute Rehab OT Goals Patient Stated Goal: to get better OT Goal Formulation: With patient Time For Goal Achievement: 07/13/15 Potential to Achieve Goals: Good  OT Frequency: Min 3X/week   Barriers to D/C: Decreased caregiver support          Co-evaluation PT/OT/SLP Co-Evaluation/Treatment: Yes Reason for Co-Treatment: Complexity of the patient's impairments (multi-system involvement);For patient/therapist safety PT goals addressed during session: Mobility/safety with mobility OT goals addressed during session: ADL's and self-care;Strengthening/ROM      End of Session Nurse Communication: Mobility status (unable to safely get pt to recliner with +2 A, will need to be lifted OOB with equipment to be OOB)  Activity Tolerance: Patient limited by fatigue Patient left: in bed;with call  bell/phone within reach;with bed alarm set   Time: 8295-6213 OT Time Calculation (min): 26 min Charges:  OT General Charges $OT Visit: 1 Procedure OT Evaluation $OT Eval Moderate Complexity: 1 Procedure  Evette GeorgesLeonard, Valerie Willig Valerie Wagner 06/29/2015, 11:27 AM

## 2015-06-29 NOTE — Evaluation (Addendum)
Clinical/Bedside Swallow Evaluation Patient Details  Name: Ineze Serrao MRN: [id number] Date of Birth: Jan 19, 1951  Today's Date: 06/29/2015 Time: SLP Start Time (ACUTE ONLY): 1340 SLP Stop Time (ACUTE ONLY): 1355 SLP Time Calculation (min) (ACUTE ONLY): 15 min  Past Medical History:  Past Medical History  Diagnosis Date  . Chest pain     Reassuring workup of her time including negative cardiac catheterization and stress testing  . Mixed hyperlipidemia   . Obesity   . Essential hypertension, benign   . COPD (chronic obstructive pulmonary disease) (HCC)   . DJD (degenerative joint disease)   . Urinary incontinence   . GERD (gastroesophageal reflux disease)   . IBS (irritable bowel syndrome)   . Fibromyalgia   . Asthma   . Neuropathy (HCC)   . Shortness of breath dyspnea   . Type 2 diabetes mellitus (HCC)   . Depression   . Anxiety   . DDD (degenerative disc disease), lumbar    Past Surgical History:  Past Surgical History  Procedure Laterality Date  . Tonsillectomy    . Hysterectomy -- unknown    . Cholecystectomy    . Carpal tunnel release      Right  . Left thumb surgery    . Cesarean section     HPI:  65 y.o. female with past medical history of type 2 diabetes mellitus, essential hypertension, GERD, COPD, fibromyalgia, chronic back pain and obesity admitted for altered mental status, fever and unresponsiveness. Found to be septic, neurologist suspects infectious process, toxic metabolic encephalopathy. CXR lungs hypoexpanded. Vascular congestion and cardiomegaly, with mildly increased interstitial markings, concerning for mild interstitial edema.   Assessment / Plan / Recommendation Clinical Impression  Pt alert, confused, delayed processing and responses requiring moderate verbal cues and additional response time. Immediate cough following thin liquids via cup indicative of likely laryngeal penetration and or aspiration. Multiple swallows revealing possible pharyngeal  residue. Labial residue and spillage due to decreased cognitive impairments. Recommend continue NPO status with objective swallow assessment, FEES due to broad shoulders.  Pt has not received nutrition since admission; suspect FEES will not be performed 1/10 due to general anesthesia for lumbar puncutre and inability to sit upright 4 hours after procedure. Consider alternate means of nutrition.     Aspiration Risk  Moderate aspiration risk    Diet Recommendation NPO   Medication Administration: Via alternative means    Other  Recommendations Oral Care Recommendations: Oral care QID   Follow up Recommendations   (TBD)    Frequency and Duration min 2x/week  2 weeks       Prognosis Prognosis for Safe Diet Advancement: Good Barriers to Reach Goals: Cognitive deficits      Swallow Study   General HPI: 65 y.o. female with past medical history of type 2 diabetes mellitus, essential hypertension, GERD, COPD, fibromyalgia, chronic back pain and obesity admitted for altered mental status, fever and unresponsiveness. Found to be septic, neurologist suspects infectious process, toxic metabolic encephalopathy. CXR lungs hypoexpanded. Vascular congestion and cardiomegaly, with mildly increased interstitial markings, concerning for mild interstitial edema. Type of Study: Bedside Swallow Evaluation Previous Swallow Assessment:  (none found) Diet Prior to this Study: NPO Temperature Spikes Noted: Yes Respiratory Status: Nasal cannula History of Recent Intubation: No Behavior/Cognition: Alert;Cooperative;Pleasant mood;Confused;Requires cueing;Distractible Oral Cavity Assessment: Dry Oral Care Completed by SLP: Yes Oral Cavity - Dentition: Adequate natural dentition Self-Feeding Abilities: Needs assist;Needs set up Patient Positioning: Upright in bed Baseline Vocal Quality: Low vocal intensity Volitional Cough: Strong (  required max cues) Volitional Swallow: Able to elicit    Oral/Motor/Sensory  Function Overall Oral Motor/Sensory Function: Generalized oral weakness (difficulty performing due to cognitive deficits, no focal de) Velum: Within Functional Limits Mandible: Within Functional Limits   Ice Chips Ice chips: Not tested   Thin Liquid Thin Liquid: Impaired Presentation: Cup Oral Phase Impairments: Reduced labial seal Oral Phase Functional Implications: Right anterior spillage Pharyngeal  Phase Impairments: Multiple swallows;Cough - Immediate    Nectar Thick Nectar Thick Liquid: Not tested   Honey Thick Honey Thick Liquid: Not tested   Puree Puree: Impaired Presentation: Spoon;Self Fed Oral Phase Impairments: Reduced labial seal Oral Phase Functional Implications: Right anterior spillage (left labial residue) Pharyngeal Phase Impairments:  (none)   Solid   GO    Solid: Not tested       Royce MacadamiaLitaker, Samatha Anspach Willis 06/29/2015,2:48 PM  Breck CoonsLisa Willis Lonell FaceLitaker M.Ed [employer]-SLP Pager [phone]

## 2015-06-29 NOTE — Progress Notes (Signed)
EEG Completed; Results Pending  

## 2015-06-29 NOTE — Progress Notes (Signed)
Subjective: Continues to be encephalopathic  Exam: Filed Vitals:   06/29/15 0400 06/29/15 0600  BP: 150/84 135/85  Pulse: 74 74  Temp: 100 F (37.8 C) 99.9 F (37.7 C)  Resp: 21 23   Gen: In bed, NAD Resp: non-labored breathing, no acute distress Abd: soft, nt  Neuro: MS: Awake, alert, [place] that [place] in [place], refuses to accept that this is [place]. Does not know month or which holiday recently passed.  ZO:[id number]:endorses vision in all 4 quadrants, but does not count fingers in the left field.  Motor: Moves all extremities. I question whether there is some weakness on the left side, but this could be due solely to effort as she does not give good effort on either side.  Sensory:endorses sensation bilaterally.   Pertinent Labs: WBC 13  MRI 1/7 no acute findings.   Impression: 65 yo F with fever, leukocytosis, delirium. She has now had multiple days of acyclovir, vancomycin, ceftriaxone with continued encephalopathy. With fevers and confusion, I do think that an infectious etiology is most likely. We could try her LP again, but am concerned that since it has not been able to be accomplished even with ativan previously, it may be difficult to do without more significant sedation.   She did have an episode of unresponsiveness last night.   Recommendations: 1) EEG 2) will consider starting depakote following EEG 3) Consider options for CSF sampling.   Ritta SlotMcNeill Aiko Belko, MD [facility] [phone]  If 7pm- 7am, please page neurology on call as listed in [facility]ION.

## 2015-06-29 NOTE — Progress Notes (Signed)
CSW spoke with pt son and pt husband regarding PT recommendation for SNF.  Son reports that he lives with pt and pt spouse and is able to help out at home- states that he has already been helping a lot with care at home and would prefer to continue doing so- not agreeable to SNF placement at this time.  RNCM informed- no additional CSW needs identified at this time- signing off  Merlyn LotJenna Holoman, [facility] IncCSWA Clinical Social Worker [phone]

## 2015-06-29 NOTE — H&P (Signed)
Chief Complaint: Patient was seen in consultation today for image guided lumbar puncture Chief Complaint  Patient presents with  . Altered Mental Status   at the request of Dr Amada Jupiter  Referring Physician(s): Dr Amada Jupiter  History of Present Illness: Valerie Wagner is a 65 y.o. female   To ED 06/25/15 Fever; leukocytosis; altered mental status Antibiotic treatment- some better Unable to get LP secondary to agitation Lumbar puncture with anesthesia requested per Dr Amada Jupiter   Past Medical History  Diagnosis Date  . Chest pain     Reassuring workup of her time including negative cardiac catheterization and stress testing  . Type 2 diabetes mellitus   . Mixed hyperlipidemia   . Obesity   . Essential hypertension, benign   . COPD (chronic obstructive pulmonary disease)   . DJD (degenerative joint disease)   . Urinary incontinence   . GERD (gastroesophageal reflux disease)   . IBS (irritable bowel syndrome)   . Fibromyalgia   . Asthma   . Neuropathy     Past Surgical History  Procedure Laterality Date  . Tonsillectomy    . Hysterectomy -- unknown    . Cholecystectomy    . Carpal tunnel release      Right  . Left thumb surgery    . Cesarean section      Allergies: Nsaids; Bee venom; Chocolate; Darvon; Ivp dye; Doxycycline; and Penicillins  Medications: Prior to Admission medications   Medication Sig Start Date End Date Taking? Authorizing Provider  albuterol (VENTOLIN HFA) 108 (90 BASE) MCG/ACT inhaler Inhale 2 puffs into the lungs every 6 (six) hours as needed for wheezing or shortness of breath.    Yes Historical Provider, MD  ALPRAZolam Prudy Feeler) 0.5 MG tablet Take 0.5 mg by mouth 3 (three) times daily as needed for anxiety or sleep.    Yes Historical Provider, MD  benazepril-hydrochlorthiazide (LOTENSIN HCT) 20-12.5 MG per tablet Take 2 tablets by mouth daily.    Yes Historical Provider, MD  EPINEPHrine (EPIPEN JR) 0.15 MG/0.3ML injection Inject 0.15  mg into the muscle as needed for anaphylaxis.   Yes Historical Provider, MD  Fluticasone-Salmeterol (ADVAIR) 500-50 MCG/DOSE AEPB Inhale 1 puff into the lungs every 12 (twelve) hours.   Yes Historical Provider, MD  glipiZIDE (GLUCOTROL XL) 10 MG 24 hr tablet Take 10 mg by mouth daily with breakfast.   Yes Historical Provider, MD  HYDROcodone-acetaminophen (NORCO) 7.5-325 MG per tablet Take 1 tablet by mouth every 6 (six) hours as needed for moderate pain.   Yes Historical Provider, MD  insulin glargine (LANTUS) 100 UNIT/ML injection Inject 70 Units into the skin at bedtime.    Yes Historical Provider, MD  ipratropium-albuterol (DUONEB) 0.5-2.5 (3) MG/3ML SOLN Take 3 mLs by nebulization every 4 (four) hours as needed (wheezing).    Yes Historical Provider, MD  nitroGLYCERIN (NITROSTAT) 0.4 MG SL tablet Place 0.4 mg under the tongue every 5 (five) minutes as needed for chest pain.    Yes Historical Provider, MD  Potassium Citrate (UROCIT-K 15) 15 MEQ (1620 MG) TBCR Take 1 tablet by mouth 2 (two) times daily.   Yes Historical Provider, MD  pregabalin (LYRICA) 50 MG capsule Take 50 mg by mouth 2 (two) times daily.   Yes Historical Provider, MD  tiotropium (SPIRIVA) 18 MCG inhalation capsule Place 18 mcg into inhaler and inhale daily.   Yes Historical Provider, MD     Family History  Problem Relation Age of Onset  . Coronary artery disease Father   .  Heart attack Father     Died age 74    Social History   Social History  . Marital Status: Married    Spouse Name: N/A  . Number of Children: N/A  . Years of Education: N/A   Social History Main Topics  . Smoking status: Former Smoker -- 1.00 packs/day for 2 years    Types: Cigarettes    Quit date: 06/20/1982  . Smokeless tobacco: Never Used  . Alcohol Use: No  . Drug Use: No  . Sexual Activity: Not on file   Other Topics Concern  . Not on file   Social History Narrative  . No narrative on file    Review of Systems: A 12 point ROS  discussed and pertinent positives are indicated in the HPI above.  All other systems are negative.  Review of Systems  Constitutional: Positive for fever and activity change. Negative for appetite change.  Eyes: Negative for visual disturbance.  Respiratory: Negative for shortness of breath.   Gastrointestinal: Negative for abdominal pain.  Musculoskeletal: Positive for myalgias and gait problem.  Neurological: Positive for weakness.  Psychiatric/Behavioral: Positive for confusion and agitation. Negative for behavioral problems.    Vital Signs: BP 131/73 mmHg  Pulse 67  Temp(Src) 98.2 F (36.8 C) (Oral)  Resp 23  Ht 5\' 4"  (1.626 m)  Wt 277 lb 1.9 oz (125.7 kg)  BMI 47.54 kg/m2  SpO2 97%  Physical Exam  Cardiovascular: Normal rate, regular rhythm and normal heart sounds.   Pulmonary/Chest: Effort normal and breath sounds normal.  Abdominal: Soft. Bowel sounds are normal.  Musculoskeletal: Normal range of motion.  Neurological: She is alert.  Skin: Skin is warm.  Psychiatric:  Altered mental status calling family for consent-- Talked to son Sharia Reeve for consent  Nursing note and vitals reviewed.   Mallampati Score:  MD Evaluation Airway: WNL Heart: WNL Abdomen: WNL Chest/ Lungs: WNL ASA  Classification: 3 Mallampati/Airway Score: Two  Imaging: Ct Abdomen Pelvis Wo Contrast  06/25/2015  CLINICAL DATA:  Found on the floor unresponsive at 10 a.m. EXAM: CT ABDOMEN AND PELVIS WITHOUT CONTRAST TECHNIQUE: Multidetector CT imaging of the abdomen and pelvis was performed following the standard protocol without IV contrast. COMPARISON:  10/14/2013, 05/20/2014 FINDINGS: Lower chest:  Clear lung bases.  Normal heart size. Hepatobiliary: Normal liver.  Prior cholecystectomy. Pancreas: Normal. Spleen: Normal. Adrenals/Urinary Tract: Normal adrenal glands. 12 mm exophytic hypodense, fluid attenuating right renal mass most consistent with a cyst. No urolithiasis or obstructive uropathy.  Decompressed bladder with a Foley catheter present. Stomach/Bowel: No bowel wall thickening or dilatation. Small hiatal hernia. No pneumatosis, pneumoperitoneum or portal venous gas. No abdominal pelvic free fluid. Vascular/Lymphatic: Normal caliber abdominal aorta with mild atherosclerosis. No lymphadenopathy. Reproductive: No adnexal mass.  Prior hysterectomy. Musculoskeletal: No acute osseous abnormality. No lytic or sclerotic osseous lesion. There is a 2.3 cm hypodense, fluid attenuating partially visualized mass in the left adductor brevis muscle concerning for a liquified hematoma versus ganglion cyst, but this is incompletely characterized. IMPRESSION: 1. No acute abdominal or pelvic pathology. 2. 2.3 cm hypodense, fluid attenuating partially visualized mass in the left adductor brevis muscle concerning for a liquified hematoma versus ganglion cyst, but this is incompletely characterized. This is unchanged compared with 05/20/2014. Electronically Signed   By: Elige Ko   On: 06/25/2015 12:25   Ct Head Wo Contrast  06/25/2015  CLINICAL DATA:  Found on floor.  Altered mental status EXAM: CT HEAD WITHOUT CONTRAST TECHNIQUE: Contiguous axial images  were obtained from the base of the skull through the vertex without intravenous contrast. COMPARISON:  MRI 01/22/2015 FINDINGS: Image quality degraded by significant motion. This study was repeated however there is persistent motion on the repeat exam Ventricle size normal. Negative for acute infarct. Negative for acute hemorrhage or mass. Subtle findings could be missed on the study given the amount of motion. IMPRESSION: Image quality degraded by significant motion. No acute abnormality. Repeat study recommended if symptoms persist. Electronically Signed   By: Marlan Palau M.D.   On: 06/25/2015 12:24   Mr Brain Wo Contrast  06/27/2015  CLINICAL DATA:  Found down by husband yesterday morning, poorly responsive. Febrile and hypotensive. History of diabetes,  hyperlipidemia. EXAM: MRI HEAD WITHOUT CONTRAST TECHNIQUE: Multiplanar, multiecho pulse sequences of the brain and surrounding structures were obtained without intravenous contrast. COMPARISON:  CT head June 25, 2015 and MRI of the brain January 22, 2015 FINDINGS: Mildly motion degraded examination. The ventricles and sulci are normal for patient's age. No abnormal parenchymal signal, mass lesions, mass effect. No reduced diffusion to suggest acute ischemia. No susceptibility artifact to suggest hemorrhage. No abnormal extra-axial fluid collections. No extra-axial masses though, contrast enhanced sequences would be more sensitive. Normal major intracranial vascular flow voids seen at the skull base. Ocular globes and orbital contents are unremarkable though not tailored for evaluation. No abnormal sellar expansion. No suspicious calvarial bone marrow signal. Craniocervical junction maintained. Small RIGHT maxillary mucosal retention cyst. Small bilateral mastoid effusions. Is LEFT parotid gland appears enlarged with a 15 mm focus of low T2 signal associated with reduced diffusion. IMPRESSION: Negative motion degraded noncontrast MRI brain. LEFT parotid gland may be enlarged, with superimposed 15 mm possible hematoma or mass. Recommend follow-up evaluation on a nonemergent basis. Electronically Signed   By: Awilda Metro M.D.   On: 06/27/2015 03:23   Dg Chest Port 1 View  06/28/2015  CLINICAL DATA:  Acute onset of dyspnea.  Initial encounter. EXAM: PORTABLE CHEST 1 VIEW COMPARISON:  Chest radiograph from 06/25/2015 FINDINGS: The lungs are hypoexpanded. Vascular congestion is noted, with mildly increased interstitial markings, concerning for mild interstitial edema. No definite pleural effusion or pneumothorax is seen. The cardiomediastinal silhouette is enlarged. No acute osseous abnormalities are seen. IMPRESSION: Lungs hypoexpanded. Vascular congestion and cardiomegaly, with mildly increased interstitial  markings, concerning for mild interstitial edema. Electronically Signed   By: Roanna Raider M.D.   On: 06/28/2015 06:21   Dg Chest Port 1 View  06/25/2015  CLINICAL DATA:  Sepsis.  Emesis immediately prior to the exam. EXAM: PORTABLE CHEST 1 VIEW COMPARISON:  Earlier this day at 1743 hour FINDINGS: Cardiomegaly and vascular congestion, unchanged allowing for differences in technique. No consolidation to suggest pneumonia. Unchanged elevation right hemidiaphragm. No pleural effusion or pneumothorax. IMPRESSION: Unchanged cardiomegaly and vascular congestion. No new abnormality is seen. Electronically Signed   By: Rubye Oaks M.D.   On: 06/25/2015 22:43   Dg Chest Port 1 View  06/25/2015  CLINICAL DATA:  Altered mental status. EXAM: PORTABLE CHEST 1 VIEW COMPARISON:  01/21/2015 FINDINGS: Cardiac enlargement with vascular congestion. Mild right lower lobe atelectasis. No effusion. IMPRESSION: Cardiac enlargement with vascular congestion Mild right lower lobe atelectasis. Electronically Signed   By: Marlan Palau M.D.   On: 06/25/2015 11:44   Dg Chest Port 1v Same Day  06/25/2015  CLINICAL DATA:  Patient was found unresponsive this morning. New onset of wheezing and vomiting today. EXAM: PORTABLE CHEST 1 VIEW COMPARISON:  January 5th 05/09/2016  1 a.m. FINDINGS: The heart size and mediastinal contours are stable. Heart size is enlarged. There is central pulmonary vascular congestion. There is no focal pneumonia or pleural effusion. The visualized skeletal structures are stable. IMPRESSION: Cardiomegaly and central pulmonary vascular congestion not changed. Electronically Signed   By: Sherian Rein M.D.   On: 06/25/2015 17:58   Dg Fluoro Guide Lumbar Puncture  06/27/2015  CLINICAL DATA:  65 year old female inpatient with sepsis, fevers and acute encephalopathy concerning for meningoencephalitis. EXAM: DIAGNOSTIC LUMBAR PUNCTURE UNDER FLUOROSCOPIC GUIDANCE FLUOROSCOPY TIME:  Fluoroscopy Time (in minutes and  seconds): 18 second Number of Acquired Images:  1 image capture PROCEDURE: Informed consent was obtained from the patient's family prior to the procedure, including potential complications of headache, allergy, and pain. Under fluoroscopy, the L4-5 level was chosen for lumbar puncture. With the patient prone, the lower back was prepped with Betadine. The patient was non-communicative and not alert to person, place or time and demonstrated significant agitation as soon as the lower back was prepped with Betadine. Local anesthesia was attempted with 1% Lidocaine, however the patient's degree of agitation (despite pre-treatment with haldol and benzodiazepine) precluded safe performance of the procedure and the decision was made to discontinue the procedure. The spinal needle was not used. IMPRESSION: Lumbar puncture discontinued prior to spinal needle usage due to patient agitation. These results were called by telephone at the time of interpretation on 06/27/2015 at 3:24 pm to Dr. Lynden Oxford , who verbally acknowledged these results. Electronically Signed   By: Delbert Phenix M.D.   On: 06/27/2015 15:44    Labs:  CBC:  Recent Labs  06/26/15 1355 06/27/15 0340 06/28/15 0402 06/29/15 0327  WBC 13.8* 15.7* 13.0* 13.1*  HGB 12.0 13.4 12.9 14.0  HCT 36.2 39.9 37.0 40.1  PLT 229 220 242 241    COAGS:  Recent Labs  06/25/15 2306  INR 1.24  APTT 27    BMP:  Recent Labs  06/27/15 0340 06/28/15 0402 06/28/15 1623 06/29/15 0327  NA 137 139 141 141  K 3.7 2.9* 4.7 3.5  CL 102 100* 103 104  CO2 23 28 24 27   GLUCOSE 264* 201* 216* 148*  BUN <5* 6 8 8   CALCIUM 8.8* 9.1 9.6 9.3  CREATININE 0.68 0.66 0.80 0.61  GFRNONAA >60 >60 >60 >60  GFRAA >60 >60 >60 >60    LIVER FUNCTION TESTS:  Recent Labs  06/25/15 1105 06/25/15 2306 06/28/15 0402  BILITOT 0.9 0.6 0.6  AST 38 32 45*  ALT 36 30 36  ALKPHOS 81 65 51  PROT 8.1 6.5 6.2*  ALBUMIN 4.5 3.6 3.0*    TUMOR MARKERS: No results  for input(s): AFPTM, CEA, CA199, CHROMGRNA in the last 8760 hours.  Assessment and Plan:  Altered mental status Fever; leukocytosis Agitation and unable to tolerate Lumbar puncture even with conscious sedation Now scheduled in IR 1/10 for lumbar puncture with anesthesia Discussed procedure with pts son- Valerie Wagner. Risks and benefits including but not limited to: Infection; bleeding; damage to surrounding structures He is agreeable to proceed Consent signed andin chart   Thank you for this interesting consult.  I greatly enjoyed meeting Valerie Wagner and look forward to participating in their care.  A copy of this report was sent to the requesting provider on this date.  Signed: Tine Mabee A 06/29/2015, 2:29 PM   I spent a total of 20 Minutes    in face to face in clinical consultation, greater than 50% of which was  counseling/coordinating care for lumbar puncture with anesthesia

## 2015-06-29 NOTE — Progress Notes (Signed)
Patient ID: Valerie Wagner, female   DOB: [birth year], 65 y.o.   MRN: [id number]         [facility]    Date of Admission:  06/25/2015   Total days of antibiotics 4          Principal Problem:   Acute encephalopathy Active Problems:   Sepsis (HCC)   Fever   Essential hypertension, benign   Severe sepsis (HCC)   Somnolence   Chronic pain   Type 2 diabetes mellitus (HCC)   Polyneuropathy (HCC)   Severe obesity (BMI >= 40) (HCC)   Altered mental state   Type 2 diabetes mellitus without complication, without long-term current use of insulin (HCC)   Meningoencephalitis   . acyclovir  600 mg Intravenous 3 times per day  . antiseptic oral rinse  7 mL Mouth Rinse BID  . cefTRIAXone (ROCEPHIN)  IV  2 g Intravenous Q12H  . insulin aspart  0-15 Units Subcutaneous Q4H  . insulin glargine  25 Units Subcutaneous QHS  . levETIRAcetam  500 mg Intravenous Q12H  . sodium chloride  3 mL Intravenous Q12H  . vancomycin  1,000 mg Intravenous Q8H    SUBJECTIVE: She is complaining of headache.  Review of Systems: Review of Systems  Unable to perform ROS: other    Past Medical History  Diagnosis Date  . Chest pain     Reassuring workup of her time including negative cardiac catheterization and stress testing  . Mixed hyperlipidemia   . Obesity   . Essential hypertension, benign   . COPD (chronic obstructive pulmonary disease) (HCC)   . DJD (degenerative joint disease)   . Urinary incontinence   . GERD (gastroesophageal reflux disease)   . IBS (irritable bowel syndrome)   . Fibromyalgia   . Asthma   . Neuropathy (HCC)   . Shortness of breath dyspnea   . Type 2 diabetes mellitus (HCC)   . Depression   . Anxiety   . DDD (degenerative disc disease), lumbar     Social History  Substance Use Topics  . Smoking status: Former Smoker -- 1.00 packs/day for 2 years    Types: Cigarettes    Quit date: 06/20/1982  . Smokeless tobacco: Never Used  . Alcohol Use:  No    Family History  Problem Relation Age of Onset  . Coronary artery disease Father   . Heart attack Father     Died age 63   Allergies  Allergen Reactions  . Nsaids Shortness Of Breath  . Bee Venom   . Chocolate   . Darvon [Propoxyphene]   . Ivp Dye [Iodinated Diagnostic Agents] Swelling  . Doxycycline Rash  . Penicillins Rash    OBJECTIVE: Filed Vitals:   06/29/15 0300 06/29/15 0400 06/29/15 0600 06/29/15 1209  BP: 146/81 150/84 135/85 131/73  Pulse: 79 74 74 67  Temp: 100.2 F (37.9 C) 100 F (37.8 C) 99.9 F (37.7 C) 98.2 F (36.8 C)  TempSrc:  Core (Comment)  Oral  Resp: 20 21 23 23   Height:      Weight:      SpO2: 96% 99% 98% 97%   Body mass index is 47.54 kg/(m^2).  Physical Exam  Constitutional:  She looks much better than when I saw her 3 days ago.  Neurological: She is alert.  She is smiling and answers questions appropriately with short answers. She is currently preparing to undergo EEG.    Lab Results Lab Results  Component Value  Date   WBC 13.1* 06/29/2015   HGB 14.0 06/29/2015   HCT 40.1 06/29/2015   MCV 88.7 06/29/2015   PLT 241 06/29/2015    Lab Results  Component Value Date   CREATININE 0.61 06/29/2015   BUN 8 06/29/2015   NA 141 06/29/2015   K 3.5 06/29/2015   CL 104 06/29/2015   CO2 27 06/29/2015    Lab Results  Component Value Date   ALT 36 06/28/2015   AST 45* 06/28/2015   ALKPHOS 51 06/28/2015   BILITOT 0.6 06/28/2015     Microbiology: Recent Results (from the past 240 hour(s))  Blood Culture (routine x 2)     Status: None (Preliminary result)   Collection Time: 06/25/15 11:05 AM  Result Value Ref Range Status   Specimen Description BLOOD LEFT ANTECUBITAL DRAWN BY RN  Final   Special Requests BOTTLES DRAWN AEROBIC AND ANAEROBIC 6CC EACH  Final   Culture NO GROWTH 4 DAYS  Final   Report Status PENDING  Incomplete  Urine culture     Status: None   Collection Time: 06/25/15 11:06 AM  Result Value Ref Range Status     Specimen Description URINE, CATHETERIZED  Final   Special Requests NONE  Final   Culture   Final    NO GROWTH 1 DAY Performed at [facility]    Report Status 06/26/2015 FINAL  Final  Blood Culture (routine x 2)     Status: None (Preliminary result)   Collection Time: 06/25/15 12:21 PM  Result Value Ref Range Status   Specimen Description BLOOD LEFT HAND  Final   Special Requests BOTTLES DRAWN AEROBIC ONLY 4CC  Final   Culture NO GROWTH 4 DAYS  Final   Report Status PENDING  Incomplete  MRSA PCR Screening     Status: None   Collection Time: 06/25/15  9:08 PM  Result Value Ref Range Status   MRSA by PCR NEGATIVE NEGATIVE Final    Comment:        The GeneXpert MRSA Assay (FDA approved for NASAL specimens only), is one component of a comprehensive MRSA colonization surveillance program. It is not intended to diagnose MRSA infection nor to guide or monitor treatment for MRSA infections.   Culture, blood (x 2)     Status: None (Preliminary result)   Collection Time: 06/25/15 11:06 PM  Result Value Ref Range Status   Specimen Description LEFT ANTECUBITAL  Final   Special Requests BOTTLES DRAWN AEROBIC ONLY 4CC  Final   Culture NO GROWTH 3 DAYS  Final   Report Status PENDING  Incomplete  Culture, blood (x 2)     Status: None (Preliminary result)   Collection Time: 06/25/15 11:16 PM  Result Value Ref Range Status   Specimen Description RIGHT ANTECUBITAL  Final   Special Requests IN PEDIATRIC BOTTLE 3CC  Final   Culture NO GROWTH 3 DAYS  Final   Report Status PENDING  Incomplete     ASSESSMENT: 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  PLAN: 1. Continue current antimicrobial therapy targeting bacterial infections and herpes simplex. 2. LP when it can be safely performed  Cliffton Asters, MD [facility] [phone] pager   [phone] cell 06/29/2015, 2:47 PM

## 2015-06-29 NOTE — Evaluation (Addendum)
Physical Therapy Evaluation Patient Details Name: Valerie Wagner MRN: [id number] DOB: 1950-10-09 Today's Date: 06/29/2015   History of Present Illness  65 yo F with fever, leukocytosis, delirium. She has now had multiple days of acyclovir, vancomycin, ceftriaxone with continued encephalopathy. With fevers and confusion, I do think that an infectious etiology is most likely. We could try her LP again, but am concerned that since it has not been able to be accomplished even with ativan previously, it may be difficult to do without more significant sedation. Past medical history of type 2 diabetes mellitus, essential hypertension, chronic back pain and obesity.   Clinical Impression  Pt admitted with above diagnosis. Pt currently with functional limitations due to the deficits listed below (see PT Problem List). Pt very impaired with poor balance, poor ability to assist with movement and poor endurance with pt on 6LO2.  Will most likely need Rehab for therapy at d/c as she is +2 assist for bed mobility and +3 assist for OOB.  Will follow acutely.   Pt will benefit from skilled PT to increase their independence and safety with mobility to allow discharge to the venue listed below.      Follow Up Recommendations Rehab;Supervision/Assistance - 24 hour    Equipment Recommendations  Other (comment) (TBA)    Recommendations for Other Services       Precautions / Restrictions Precautions Precautions: Fall Restrictions Weight Bearing Restrictions: No      Mobility  Bed Mobility Overal bed mobility: Needs Assistance;+2 for physical assistance Bed Mobility: Supine to Sit;Sit to Supine     Supine to sit: Total assist;+2 for physical assistance;HOB elevated Sit to supine: Total assist;+2 for physical assistance   General bed mobility comments: Pt not able to assist much with supine to sit with therapist initiating with LEs and total assist for elevation of trunk with pt helping less than 15%.  Total  assist to lie pt back down and to scoot up to [facility].  could not follow cues to roll.   Transfers                    Ambulation/Gait                Stairs            Wheelchair Mobility    Modified Rankin (Stroke Patients Only) Modified Rankin (Stroke Patients Only) Pre-Morbid Rankin Score: Moderate disability Modified Rankin: Severe disability     Balance Overall balance assessment: Needs assistance;History of Falls Sitting-balance support: Single extremity supported;Feet supported Sitting balance-Leahy Scale: Zero Sitting balance - Comments: Pt total assist to sit EOB most of time while sitting for 10 minutes.  Pt leaning to her right and was unable to shift weight to left side with cues and assist. Pt kicked left LE with trace movement to command and kicked right LE a little more.  Pt followed commands to shake hands with bil UEs with delayed response time of 5 seconds and could not reach very far to meet therapists hand.  Pt washed face with right UE to command after placing cloth in hand and giving cue 4 times.  Pt never ableto achieve full upright sitting.   Postural control: Right lateral lean;Posterior lean                                   Pertinent Vitals/Pain Pain Assessment: Faces Faces Pain Scale: Hurts little  more Pain Location: generalized Pain Descriptors / Indicators: Grimacing;Guarding Pain Intervention(s): Limited activity within patient's tolerance;Monitored during session;Repositioned  93-94% on 6LO2 at rest    Home Living Family/patient expects to be discharged to:: Private residence Living Arrangements: Children;Spouse/significant other (son works, husband mentioned in chart but pt does not mentio) Available Help at Discharge: Family;Available PRN/intermittently Type of Home: House Home Access: Stairs to enter Entrance Stairs-Rails: None Entrance Stairs-Number of Steps: 1 Home Layout: One level Home Equipment: Cane -  single point Additional Comments: Pt questionable historian.  Confusion noted.    Prior Function Level of Independence: Independent         Comments: unsure as pt a questionable historian     Hand Dominance        Extremity/Trunk Assessment   Upper Extremity Assessment: Defer to OT evaluation           Lower Extremity Assessment: LLE deficits/detail;RLE deficits/detail RLE Deficits / Details: noted extensor tone with movement to command.  strength grossly 3-/5 LLE Deficits / Details: strength grossly 2/5.  Cervical / Trunk Assessment: Kyphotic  Communication   Communication: Expressive difficulties;Receptive difficulties  Cognition Arousal/Alertness: Awake/alert Behavior During Therapy: Flat affect Overall Cognitive Status: Impaired/Different from baseline Area of Impairment: Orientation;Memory;Following commands;Awareness;Safety/judgement;Problem solving;Attention Orientation Level: Disoriented to;Situation;Place;Time Current Attention Level: Focused Memory: Decreased short-term memory Following Commands: Follows one step commands inconsistently;Follows one step commands with increased time Safety/Judgement: Decreased awareness of safety;Decreased awareness of deficits Awareness: Intellectual Problem Solving: Difficulty sequencing;Requires verbal cues;Requires tactile cues;Slow processing;Decreased initiation General Comments: Pt with delayed response time. Noted questionable inattention to left hemibody.    General Comments      Exercises General Exercises - Lower Extremity Long Arc Quad: AAROM;Both;5 reps;Seated      Assessment/Plan    PT Assessment Patient needs continued PT services  PT Diagnosis Generalized weakness   PT Problem List Decreased activity tolerance;Decreased balance;Decreased mobility;Decreased strength;Decreased coordination;Decreased knowledge of use of DME;Decreased safety awareness;Decreased knowledge of precautions  PT Treatment  Interventions DME instruction;Gait training;Functional mobility training;Therapeutic activities;Therapeutic exercise;Balance training;Patient/family education   PT Goals (Current goals can be found in the Care Plan section) Acute Rehab PT Goals Patient Stated Goal: to get better PT Goal Formulation: With patient Time For Goal Achievement: 07/13/15 Potential to Achieve Goals: Good    Frequency Min 3X/week   Barriers to discharge Decreased caregiver support      Co-evaluation PT/OT/SLP Co-Evaluation/Treatment: Yes Reason for Co-Treatment: Complexity of the patient's impairments (multi-system involvement);For patient/therapist safety PT goals addressed during session: Mobility/safety with mobility         End of Session Equipment Utilized During Treatment: Gait belt;Oxygen Activity Tolerance: Patient limited by fatigue Patient left: in bed;with call bell/phone within reach;with bed alarm set;with SCD's reapplied Nurse Communication: Mobility status;Need for lift equipment         Time: 0943-1010 PT Time Calculation (min) (ACUTE ONLY): 27 min   Charges:   PT Evaluation $PT Eval Moderate Complexity: 1 Procedure     PT G CodesBerline Lopes:        Dezmond Downie F 06/29/2015, 10:35 AM  Eber Jonesawn Delio Slates,PT Acute Rehabilitation [phone] (pager)

## 2015-06-29 NOTE — Progress Notes (Signed)
Pharmacy Antibiotic Follow-up Note  Valerie Wagner is a 65 y.o. year-old female admitted on 06/25/2015.  The patient is currently on day 5 of vancomycin and ceftriaxone and acyclovir  for meningitis.  Assessment/Plan: Day #5 vancomycin/ceftriaxone/acyclovir for meningitis.  Tmax 102>>100.4, WBC 16.4>>13.1.  To have LP done under anesthesia 1/10 at 0915. VT 1/9 = 8 mcg/ml on vanc 1 gm q12 - below goal of 15=20 mcg/ml.  Plan: increase vancomycin to 1 gm q8h and [facility] Continue ceftriaxone 2 gm q12h Continue acyclovir 600 mg IV Q8H   Temp (24hrs), Avg:100.3 F (37.9 C), Min:98.2 F (36.8 C), Max:100.9 F (38.3 C)   Recent Labs Lab 06/25/15 2306 06/26/15 1355 06/27/15 0340 06/28/15 0402 06/29/15 0327  WBC 16.4* 13.8* 15.7* 13.0* 13.1*    Recent Labs Lab 06/26/15 0420 06/27/15 0340 06/28/15 0402 06/28/15 1623 06/29/15 0327  CREATININE 0.77 0.68 0.66 0.80 0.61   Estimated Creatinine Clearance: 95.7 mL/min (by C-G formula based on Cr of 0.61).    Allergies  Allergen Reactions  . Nsaids Shortness Of Breath  . Bee Venom   . Chocolate   . Darvon [Propoxyphene]   . Ivp Dye [Iodinated Diagnostic Agents] Swelling  . Doxycycline Rash  . Penicillins Rash    Antimicrobials this admission: Vanc 1/5>> Aztreonam 1/5 >>1/6 Levaquin 1/5 >>1/6 Acyclovir x1 1/5, 1/6>> Ampicillin 1/6>>1/6 Ceftriaxone 1/6>>  Levels/dose changes this admission: 1/9 VT = 8 at 1325 on 1 gm q12, incr to 1 gm q8  Microbiology results: 1/5 BCx x2 (04/1199) >>ngtd 1/5 BCx x2 (2300) >>ngtd 1/5 UCx - ngF  Thank you for allowing pharmacy to be a part of this patient's care. Herby AbrahamMichelle T. Abdinasir Spadafore, Pharm.D. [phone] 06/29/2015 2:44 PM

## 2015-06-30 ENCOUNTER — Inpatient Hospital Stay (HOSPITAL_COMMUNITY): Payer: Medicaid Other | Admitting: Anesthesiology

## 2015-06-30 ENCOUNTER — Encounter (HOSPITAL_COMMUNITY): Payer: Self-pay | Admitting: Anesthesiology

## 2015-06-30 ENCOUNTER — Inpatient Hospital Stay (HOSPITAL_COMMUNITY): Payer: Medicaid Other

## 2015-06-30 ENCOUNTER — Encounter (HOSPITAL_COMMUNITY)
Admission: EM | Disposition: A | Discharge status: Skilled Nursing Facility | Payer: Self-pay | Source: Home / Self Care | Attending: Internal Medicine

## 2015-06-30 DIAGNOSIS — R0902 Hypoxemia: Secondary | ICD-10-CM

## 2015-06-30 HISTORY — PX: RADIOLOGY WITH ANESTHESIA: SHX6223

## 2015-06-30 LAB — BASIC METABOLIC PANEL
Anion gap: 11 (ref 5–15)
BUN: 6 mg/dL (ref 6–20)
CHLORIDE: 102 mmol/L (ref 101–111)
CO2: 29 mmol/L (ref 22–32)
CREATININE: 0.64 mg/dL (ref 0.44–1.00)
Calcium: 9.5 mg/dL (ref 8.9–10.3)
GFR calc non Af Amer: >60 mL/min (ref >60)
Glucose, Bld: 157 mg/dL — ABNORMAL HIGH (ref 65–99)
POTASSIUM: 2.7 mmol/L — AB (ref 3.5–5.1)
SODIUM: 142 mmol/L (ref 135–145)

## 2015-06-30 LAB — CBC
HCT: 39.7 % (ref 36.0–46.0)
Hemoglobin: 13.1 g/dL (ref 12.0–15.0)
MCH: 30.1 pg (ref 26.0–34.0)
MCHC: 33 g/dL (ref 30.0–36.0)
MCV: 91.3 fL (ref 78.0–100.0)
PLATELETS: 282 10*3/uL (ref 150–400)
RBC: 4.35 MIL/uL (ref 3.87–5.11)
RDW: 12.5 % (ref 11.5–15.5)
WBC: 12.5 10*3/uL — AB (ref 4.0–10.5)

## 2015-06-30 LAB — CSF CELL COUNT WITH DIFFERENTIAL
EOS CSF: 0 % (ref 0–1)
EOS CSF: 0 % (ref 0–1)
Lymphs, CSF: 94 % — ABNORMAL HIGH (ref 40–80)
Lymphs, CSF: 95 % — ABNORMAL HIGH (ref 40–80)
Monocyte-Macrophage-Spinal Fluid: 5 % — ABNORMAL LOW (ref 15–45)
Monocyte-Macrophage-Spinal Fluid: 5 % — ABNORMAL LOW (ref 15–45)
RBC COUNT CSF: 124 /mm3 — AB
RBC Count, CSF: 2150 /mm3 — ABNORMAL HIGH
Segmented Neutrophils-CSF: 0 % (ref 0–6)
Segmented Neutrophils-CSF: 1 % (ref 0–6)
TUBE #: 1
Tube #: 4
WBC, CSF: 12 /mm3 (ref 0–5)
WBC, CSF: 14 /mm3 (ref 0–5)

## 2015-06-30 LAB — PROTEIN AND GLUCOSE, CSF
Glucose, CSF: 85 mg/dL — ABNORMAL HIGH (ref 40–70)
Total  Protein, CSF: 77 mg/dL — ABNORMAL HIGH (ref 15–45)

## 2015-06-30 LAB — GLUCOSE, CAPILLARY
GLUCOSE-CAPILLARY: 131 mg/dL — AB (ref 65–99)
GLUCOSE-CAPILLARY: 155 mg/dL — AB (ref 65–99)
GLUCOSE-CAPILLARY: 170 mg/dL — AB (ref 65–99)
Glucose-Capillary: 159 mg/dL — ABNORMAL HIGH (ref 65–99)
Glucose-Capillary: 154 mg/dL — ABNORMAL HIGH (ref 65–99)
Glucose-Capillary: 183 mg/dL — ABNORMAL HIGH (ref 65–99)
Glucose-Capillary: 137 mg/dL — ABNORMAL HIGH (ref 65–99)

## 2015-06-30 LAB — CULTURE, BLOOD (ROUTINE X 2)
Culture: NO GROWTH
Culture: NO GROWTH

## 2015-06-30 LAB — POCT I-STAT 4, (NA,K, GLUC, HGB,HCT)
Glucose, Bld: 162 mg/dL — ABNORMAL HIGH (ref 65–99)
HCT: 41 % (ref 36.0–46.0)
Hemoglobin: 13.9 g/dL (ref 12.0–15.0)
POTASSIUM: 3.2 mmol/L — AB (ref 3.5–5.1)
SODIUM: 142 mmol/L (ref 135–145)

## 2015-06-30 LAB — CRYPTOCOCCAL ANTIGEN, CSF: CRYPTO AG: NEGATIVE

## 2015-06-30 SURGERY — RADIOLOGY WITH ANESTHESIA
Anesthesia: Monitor Anesthesia Care

## 2015-06-30 MED ORDER — SODIUM CHLORIDE 0.9 % IV SOLN
750.0000 mg | Freq: Two times a day (BID) | INTRAVENOUS | Status: DC
  Administered 2015-06-30 – 2015-07-01 (×3): 750 mg via INTRAVENOUS
  Filled 2015-06-30 (×5): qty 7.5

## 2015-06-30 MED ORDER — SUCCINYLCHOLINE CHLORIDE 20 MG/ML IJ SOLN: INTRAMUSCULAR | Status: DC | PRN

## 2015-06-30 MED ORDER — PHENYLEPHRINE HCL 10 MG/ML IJ SOLN
INTRAMUSCULAR | Status: DC | PRN
  Administered 2015-06-30 (×4): 40 ug via INTRAVENOUS

## 2015-06-30 MED ORDER — LACTATED RINGERS IV SOLN
INTRAVENOUS | Status: DC | PRN
  Administered 2015-06-30 (×2): via INTRAVENOUS

## 2015-06-30 MED ORDER — ONDANSETRON HCL 4 MG/2ML IJ SOLN
INTRAMUSCULAR | Status: DC | PRN
  Administered 2015-06-30: 4 mg via INTRAVENOUS

## 2015-06-30 MED ORDER — POTASSIUM CHLORIDE 20 MEQ/15ML (10%) PO SOLN
30.0000 meq | ORAL | Status: AC
  Administered 2015-06-30: 30 meq via ORAL
  Filled 2015-06-30: qty 30

## 2015-06-30 MED ORDER — SUCCINYLCHOLINE CHLORIDE 20 MG/ML IJ SOLN
INTRAMUSCULAR | Status: DC | PRN
  Administered 2015-06-30: 100 mg via INTRAVENOUS

## 2015-06-30 MED ORDER — JEVITY 1.2 CAL PO LIQD
1000.0000 mL | ORAL | Status: DC
  Filled 2015-06-30 (×3): qty 1000

## 2015-06-30 MED ORDER — PROPOFOL 10 MG/ML IV BOLUS
INTRAVENOUS | Status: DC | PRN
  Administered 2015-06-30: 100 mg via INTRAVENOUS
  Administered 2015-06-30: 50 mg via INTRAVENOUS
  Administered 2015-06-30: 100 mg via INTRAVENOUS

## 2015-06-30 MED ORDER — LIDOCAINE HCL (CARDIAC) 20 MG/ML IV SOLN
INTRAVENOUS | Status: DC | PRN
  Administered 2015-06-30: 70 mg via INTRAVENOUS

## 2015-06-30 NOTE — Progress Notes (Addendum)
Subjective: Continues to be encephalopathic, but improving.  Does endorse recent headaches  Exam: Filed Vitals:   06/30/15 0520 06/30/15 0753  BP:  155/86  Pulse: 49 78  Temp: 99.9 F (37.7 C) 98.5 F (36.9 C)  Resp: 24 20   Gen: In bed, NAD Resp: non-labored breathing, no acute distress Abd: soft, nt  Neuro: MS: Awake, alert,  Does not know month or which holiday recently passed. She does get year correct. She does not know where she is.  ZO:[id number]:endorses vision in all 4 quadrants,  Motor: Moves all extremities. Does not give good effort for strength testing.  Sensory:endorses sensation bilaterally.   Pertinent Labs: Hypokalemia EEG no epileptiform activity.   Impression: 65 yo F with fever, leukocytosis, delirium. She has now had multiple days of acyclovir, vancomycin, ceftriaxone with continued encephalopathy. With fevers and confusion, I do think that an infectious etiology is most likely. She appears to be improving.   Recommendations: 1) continue levetiracetam, will increase slightly given episode of unresponsiveness on this dose two nights ago.  2) LP today 3) abx 4) will continue to follow.   Ritta SlotMcNeill Sterlin Knightly, MD [facility] [phone]  If 7pm- 7am, please page neurology on call as listed in AMION.

## 2015-06-30 NOTE — Progress Notes (Signed)
Initial Nutrition Assessment  DOCUMENTATION CODES:   Obesity unspecified  INTERVENTION:    Once/if tube replaced, initiate Jevity 1.2 formula at 25 ml/hr and increase by 10 ml every 4 hours to goal rate of 75 ml/hr  TF regimen to provide 2160 kcals, 100 gm protein, 1452 ml of water  NUTRITION DIAGNOSIS:   Inadequate oral intake related to inability to eat as evidenced by NPO status   GOAL:   Patient will meet greater than or equal to 90% of their needs  MONITOR:   TF tolerance, Diet advancement, Labs, Weight trends, I & O's  REASON FOR ASSESSMENT:   Consult Enteral/tube feeding initiation and management  ASSESSMENT:   65 y.o. Female with toxic metabolic encephalopathy.  Patient is currently in [facility].  [facility].  Per RN, patient had pulled her tube out and currently has no feeding access.  MD is aware.  Jevity 1.2 formula ordered via Adult Tube Feeding Protocol 1/9 per NP.  RD unable to complete Nutrition Focused Physical Exam at this time.  Diet Order:  Diet NPO time specified Except for: Sips with Meds  Skin:  Reviewed, no issues  Last BM:  1/9  Height:   Ht Readings from Last 1 Encounters:  06/25/15 5\' 4"  (1.626 m)    Weight:   Wt Readings from Last 1 Encounters:  06/30/15 272 lb 11.3 oz (123.7 kg)    Ideal Body Weight:  54.5 kg  BMI:  Body mass index is 46.79 kg/(m^2).  Estimated Nutritional Needs:   Kcal:  2000-2200  Protein:  100-110 gm  Fluid:  2.02-2. L  EDUCATION NEEDS:   No education needs identified at this time  Maureen ChattersKatie Denaly Gatling, [place]RD, LDN Pager #: [phone] After-Hours Pager #: [phone]

## 2015-06-30 NOTE — Progress Notes (Signed)
[facility] Progress Note  Patient: Valerie Wagner:[id number]   PCP: Donzetta Sprung, MD DOB: [birth year]   DOA: 06/25/2015   DOS: 06/30/2015   Date of Service: the patient was seen and examined on 06/30/2015  Subjective: The patient appears to be more awake and oriented, continues to have a difficulty following complex command.. Nutrition: Nothing by mouth at present Activity: Mostly bedridden Last BM: 06/30/2015  Assessment and Plan: 1. Acute encephalopathy meningoencephalitis  Patient presented to [facility] with sudden onset of altered mental status. Patient husband found her on the floor not waking up. She did not have loss of pulse. EMS found her combative and she was given Versed. In the ER the patient was not responsive but was found to be having fever, lactic acidosis, leukocytosis. She was also meeting criteria for sepsis Infectious disease is consulted and the patient is currently on vancomycin and ceftriaxone, acyclovir. DAY 5 Underwent IR guided lumbar puncture with general anesthesia. CSF shows increased WBC, increased protein, increased glucose, negative cryptococcal antigen, opening pressure 23 cm of water. Appreciate input from infectious disease, IR, anesthesia, radiology.  2. Acute encephalopathy. CT scan is unremarkable but it has been a motion degraded exam. MRI also unremarkable. Patient was started on Keppra, due to episodic worsening of her mental status. Repeat EEG negative for any nonconvulsive status epilepticus, appreciate input from neurology.  3. Type 2 diabetes mellitus. Due to patient's nothing by mouth status insulin Lantus at a lower dose. Continue moderate sliding scale Every 4 hours. Sugars are in acceptable range  4. Essential hypertension. Holding antihypertensive medications at present. Blood pressure stable  5. Chronic pain with polyneuropathy. Patient is on chronic pain medication at home, will use when necessary  morphine.  6. Severe obesity. Patient may have obesity hypoventilation syndrome. May benefit from outpatient sleep study. ABG shows no hypercarbia  7. Hypoxia. Pulmonary vascular congestion Activity due to IV hydration, currently fluids are stopped. This can also worsen patient's encephalopathy. Continue oxygen as needed. Critical care with peripherally follow the patient..  8. Dysphagia. Nutrition. Nothing by mouth D5 Patient was started on tube feeding on 06/29/2015 after insertion of panda tube. Patient pulled out the NG tube overnight. At present the patient cannot receive speech evaluation for further workup of her dysphagia due to recent lumbar puncture. She currently has a pending FEES by speech therapist on 07/01/2015. Since the patient has high likelihood of improvement in her mental status as well as she is highly likely remove the NG tube again, currently holding off on reinserting it. Also holding off on parenteral nutrition due to risk benefit ratio.  DVT Prophylaxis: subcutaneous Heparin  Advance goals of care discussion: Full code  Brief Summary of Hospitalization:  HPI: As per the H and P dictated on admission, "patient presented at [facility] with an episode of unresponsiveness. Was not waking up on verbal command did not lose pulse." Daily update, Procedures: 06/26/2015 unable to complete fluoroscopy guided lumbar puncture, MRI after sedation, EEG., 06/26/2025 MRI negative, lumbar puncture re ordered, 06/30/2015 lumbar puncture completed under anesthesia Consultants: Infectious disease, neurology, intervention radiology Antibiotics: Anti-infectives    Start     Dose/Rate Route Frequency Ordered Stop   06/29/15 2200  vancomycin (VANCOCIN) IVPB 1000 mg/200 mL premix     1,000 mg 200 mL/hr over 60 Minutes Intravenous Every 8 hours 06/29/15 1440     06/26/15 1730  acyclovir (ZOVIRAX) 600 mg in dextrose 5 % 100 mL IVPB  600 mg 112 mL/hr over 60 Minutes  Intravenous 3 times per day 06/26/15 1717     06/26/15 1600  ampicillin (OMNIPEN) 2 g in sodium chloride 0.9 % 50 mL IVPB  Status:  Discontinued     2 g 150 mL/hr over 20 Minutes Intravenous 6 times per day 06/26/15 1529 06/26/15 1658   06/26/15 1530  vancomycin (VANCOCIN) IVPB 1000 mg/200 mL premix  Status:  Discontinued     1,000 mg 200 mL/hr over 60 Minutes Intravenous  Once 06/26/15 1529 06/26/15 1533   06/26/15 1530  cefTRIAXone (ROCEPHIN) 2 g in dextrose 5 % 50 mL IVPB     2 g 100 mL/hr over 30 Minutes Intravenous Every 12 hours 06/26/15 1529     06/26/15 1000  levofloxacin (LEVAQUIN) IVPB 750 mg  Status:  Discontinued     750 mg 100 mL/hr over 90 Minutes Intravenous Every 24 hours 06/25/15 2052 06/26/15 1530   06/26/15 0200  vancomycin (VANCOCIN) IVPB 1000 mg/200 mL premix  Status:  Discontinued     1,000 mg 200 mL/hr over 60 Minutes Intravenous Every 12 hours 06/25/15 2052 06/29/15 1439   06/25/15 2200  aztreonam (AZACTAM) 2 g in dextrose 5 % 50 mL IVPB  Status:  Discontinued     2 g 100 mL/hr over 30 Minutes Intravenous 3 times per day 06/25/15 2052 06/26/15 1530   06/25/15 2100  aztreonam (AZACTAM) 2 g in dextrose 5 % 50 mL IVPB  Status:  Discontinued     2 g 100 mL/hr over 30 Minutes Intravenous  Once 06/25/15 1625 06/25/15 2052   06/25/15 1430  acyclovir (ZOVIRAX) 600 mg in dextrose 5 % 100 mL IVPB     600 mg 112 mL/hr over 60 Minutes Intravenous  Once 06/25/15 1405 06/25/15 1601   06/25/15 1315  vancomycin (VANCOCIN) IVPB 1000 mg/200 mL premix    Comments:  ** Give 1000mg  IV x 2 bags total back to back to = total dose of 2000mg  **   1,000 mg 200 mL/hr over 60 Minutes Intravenous  Once 06/25/15 1115 06/25/15 1459   06/25/15 1215  vancomycin (VANCOCIN) IVPB 1000 mg/200 mL premix    Comments:  ** Give 1000mg  IV x 2 bags total back to back to = total dose of 2000mg  **   1,000 mg 200 mL/hr over 60 Minutes Intravenous  Once 06/25/15 1115 06/25/15 1403   06/25/15 1115   levofloxacin (LEVAQUIN) IVPB 750 mg     750 mg 100 mL/hr over 90 Minutes Intravenous  Once 06/25/15 1106 06/25/15 1336   06/25/15 1115  aztreonam (AZACTAM) 2 g in dextrose 5 % 50 mL IVPB     2 g 100 mL/hr over 30 Minutes Intravenous  Once 06/25/15 1106 06/25/15 1245   06/25/15 1115  vancomycin (VANCOCIN) IVPB 1000 mg/200 mL premix  Status:  Discontinued     1,000 mg 200 mL/hr over 60 Minutes Intravenous  Once 06/25/15 1106 06/25/15 1110      Family Communication: family at bedside at the time of interview. Questions answered satisfactorily.  Disposition:  Barriers to safe discharge: Altered sensorium   Intake/Output Summary (Last 24 hours) at 06/30/15 1314 Last data filed at 06/30/15 1146  Gross per 24 hour  Intake   1629 ml  Output    650 ml  Net    979 ml   Filed Weights   06/25/15 1420 06/25/15 2104 06/30/15 0330  Weight: 158.759 kg (350 lb) 125.7 kg (277 lb 1.9 oz) 123.7 kg (  272 lb 11.3 oz)    Objective: Physical Exam: Filed Vitals:   06/30/15 1200 06/30/15 1215 06/30/15 1230 06/30/15 1300  BP: 151/83 126/103 131/83 136/77  Pulse: 94 94 90 78  Temp: 98.8 F (37.1 C)  98.6 F (37 C) 98.2 F (36.8 C)  TempSrc:    Oral  Resp: 17 18 21 20   Height:      Weight:      SpO2: 96% 95% 90% 92%    General: Appear in NO distress, no Rash; Oral Mucosa moist. Cardiovascular: S1 and S2 Present, no Murmur, no JVD Respiratory: Bilateral Air entry present and Clear to Auscultation, no Crackles, no wheezes Abdomen: Bowel Sound present, Soft and no tenderness Extremities: TRACE Pedal edema, no calf tenderness Neurology: Mental status verbalizing her name and follow command, able to carry out long conversation as well as track. Cranial Nerves PERRL,  Motor strength spontaneous movement of both extremities,EQUAL  Sensation present to LIGHT TOUCH reflexes  babinski unable to elicit  Data Reviewed: CBC:  Recent Labs Lab 06/25/15 1105 06/25/15 2306 06/26/15 1355  06/27/15 0340 06/28/15 0402 06/29/15 0327 06/30/15 0300 06/30/15 1001  WBC 19.3* 16.4* 13.8* 15.7* 13.0* 13.1* 12.5*  --   NEUTROABS 16.7* 14.5* 11.3*  --  8.9*  --   --   --   HGB 15.6* 12.7 12.0 13.4 12.9 14.0 13.1 13.9  HCT 45.1 39.3 36.2 39.9 37.0 40.1 39.7 41.0  MCV 90.7 90.1 89.8 90.3 89.4 88.7 91.3  --   PLT 319 254 229 220 242 241 282  --    Basic Metabolic Panel:  Recent Labs Lab 06/27/15 0340 06/28/15 0402 06/28/15 1623 06/29/15 0327 06/30/15 0300 06/30/15 1001  NA 137 139 141 141 142 142  K 3.7 2.9* 4.7 3.5 2.7* 3.2*  CL 102 100* 103 104 102  --   CO2 23 28 24 27 29   --   GLUCOSE 264* 201* 216* 148* 157* 162*  BUN <5* 6 8 8 6   --   CREATININE 0.68 0.66 0.80 0.61 0.64  --   CALCIUM 8.8* 9.1 9.6 9.3 9.5  --   MG  --   --  2.0  --   --   --    Liver Function Tests:  Recent Labs Lab 06/25/15 1105 06/25/15 2306 06/28/15 0402  AST 38 32 45*  ALT 36 30 36  ALKPHOS 81 65 51  BILITOT 0.9 0.6 0.6  PROT 8.1 6.5 6.2*  ALBUMIN 4.5 3.6 3.0*   No results for input(s): LIPASE, AMYLASE in the last 168 hours.  Recent Labs Lab 06/26/15 1355  AMMONIA 44*    Cardiac Enzymes:  Recent Labs Lab 06/25/15 1933  TROPONINI 0.03    BNP (last 3 results)  Recent Labs  06/26/15 1355  BNP 362.7*    CBG:  Recent Labs Lab 06/30/15 0022 06/30/15 0306 06/30/15 0755 06/30/15 1206 06/30/15 1300  GLUCAP 155* 131* 159* 154* 170*    Recent Results (from the past 240 hour(s))  Blood Culture (routine x 2)     Status: None (Preliminary result)   Collection Time: 06/25/15 11:05 AM  Result Value Ref Range Status   Specimen Description BLOOD LEFT ANTECUBITAL DRAWN BY RN  Final   Special Requests BOTTLES DRAWN AEROBIC AND ANAEROBIC 6CC EACH  Final   Culture NO GROWTH 4 DAYS  Final   Report Status PENDING  Incomplete  Urine culture     Status: None   Collection Time: 06/25/15 11:06 AM  Result Value Ref Range Status   Specimen Description URINE, CATHETERIZED   Final   Special Requests NONE  Final   Culture   Final    NO GROWTH 1 DAY Performed at [facility]    Report Status 06/26/2015 FINAL  Final  Blood Culture (routine x 2)     Status: None (Preliminary result)   Collection Time: 06/25/15 12:21 PM  Result Value Ref Range Status   Specimen Description BLOOD LEFT HAND  Final   Special Requests BOTTLES DRAWN AEROBIC ONLY 4CC  Final   Culture NO GROWTH 4 DAYS  Final   Report Status PENDING  Incomplete  MRSA PCR Screening     Status: None   Collection Time: 06/25/15  9:08 PM  Result Value Ref Range Status   MRSA by PCR NEGATIVE NEGATIVE Final    Comment:        The GeneXpert MRSA Assay (FDA approved for NASAL specimens only), is one component of a comprehensive MRSA colonization surveillance program. It is not intended to diagnose MRSA infection nor to guide or monitor treatment for MRSA infections.   Culture, blood (x 2)     Status: None (Preliminary result)   Collection Time: 06/25/15 11:06 PM  Result Value Ref Range Status   Specimen Description LEFT ANTECUBITAL  Final   Special Requests BOTTLES DRAWN AEROBIC ONLY 4CC  Final   Culture NO GROWTH 4 DAYS  Final   Report Status PENDING  Incomplete  Culture, blood (x 2)     Status: None (Preliminary result)   Collection Time: 06/25/15 11:16 PM  Result Value Ref Range Status   Specimen Description RIGHT ANTECUBITAL  Final   Special Requests IN PEDIATRIC BOTTLE 3CC  Final   Culture NO GROWTH 4 DAYS  Final   Report Status PENDING  Incomplete  CSF culture     Status: None (Preliminary result)   Collection Time: 06/30/15 12:25 PM  Result Value Ref Range Status   Specimen Description CSF  Final   Special Requests NONE  Final   Gram Stain   Final    CYTOSPIN SMEAR WBC PRESENT, PREDOMINANTLY MONONUCLEAR NO ORGANISMS SEEN    Culture PENDING  Incomplete   Report Status PENDING  Incomplete     Studies: Ir Fluoro Guide Ndl Plmt / Bx  06/30/2015  INDICATION: Concern for  meningoencephalitis. Attempted though ultimately unsuccessful fluoroscopic guided lumbar puncture on 06/27/2015 secondary to patient agitation. Patient returns today for fluoroscopic guided lumbar puncture with general anesthesia. EXAM: FLUOROSCOPIC GUIDED LUMBAR PUNCTURE WITH OPENING AND CLOSING PRESSURES COMPARISON:  Attempted fluoroscopic guided lumbar puncture - 06/27/2015 MEDICATIONS: None ANESTHESIA/SEDATION: General anesthesia, as administered by the anesthesia department CONTRAST:  None. FLUOROSCOPY TIME:  1 minute, 24 seconds (36 mGy) COMPLICATIONS: None immediate TECHNIQUE: Informed written consent was obtained from the patient's family after a discussion of the risks, benefits and alternatives to treatment. Questions regarding the procedure were encouraged and answered. A timeout was performed prior to the initiation of the procedure. The patient was placed prone, slightly RPO on the fluoroscopy table. The left at L2 - L3 transverse foramina was marked fluoroscopically. The skin overlying the operative site was prepped and draped in the usual sterile fashion. Local anesthesia was provided with 1% lidocaine. Under intermittent fluoroscopic guidance, a 6 inch 20 gauge spinal needle was advanced into the spinal canal. Appropriate positioning was confirmed with the efflux of slightly cloudy CSF. Opening pressure was obtained. Approximately 14 ml of cerebrospinal fluid was collected into four  separate containers. Closing pressure was unable to be obtained secondary to the size of the access needle. Initial sample was noted to be slightly cloudy, potentially secondary to prior attempted fluoroscopic guided lumbar puncture though the final 3 samples appeared clear and normal in appearance. All aspirated samples were sent to the Laboratory as requested per the clinical team. The needle was removed and hemostasis was achieved with manual compression. A dressing was placed. The patient tolerated the above procedure  well without immediate postprocedural complication. FINDINGS: Fluoroscopic imaging demonstrates appropriate positioning of the spinal needle within the spinal canal. Successful fluoroscopic lumbar puncture yielding approximately 14 ml of clear cerebrospinal fluid. Opening pressure - 23 mmHg Closing pressure - unable to be obtained secondary to the necessary size of the access needle. IMPRESSION: Technically successful fluoroscopic guided lumbar puncture with opening pressure of 23 mmHg. Samples were sent to the Laboratory as requested per the clinical team. Electronically Signed   By: Simonne ComeJohn  Watts M.D.   On: 06/30/2015 12:06   Dg Chest Port 1 View  06/30/2015  CLINICAL DATA:  Hypoxia, shortness of breath; sepsis, former smoker. EXAM: PORTABLE CHEST 1 VIEW COMPARISON:  Portable chest x-ray of June 28, 2015 FINDINGS: The lungs remain well-expanded and clear. The heart it remains enlarged. The pulmonary vascularity is not engorged. The mediastinum is normal in width. There is no pleural effusion or pneumothorax. The observed bony thorax exhibits no acute abnormality. IMPRESSION: Interval resolution of pulmonary interstitial edema. Stable mild enlargement of the cardiac silhouette. There is no evidence of pneumonia. Electronically Signed   By: David  SwazilandJordan M.D.   On: 06/30/2015 09:36   Dg Abd Portable 1v  06/29/2015  CLINICAL DATA:  65 year old female with enteric tube placement. EXAM: PORTABLE ABDOMEN - 1 VIEW COMPARISON:  Earlier radiograph dated 06/29/2015 FINDINGS: An enteric tube is partially visualized with tip in the right lower abdomen, likely in the third portion or distal aspect of the second portion of the duodenum. Air is noted within the colon. Right upper quadrant cholecystectomy clips. The osseous structures are grossly unremarkable. IMPRESSION: Partially visualized enteric tube with tip in the right lower abdomen likely in the duodenum. Electronically Signed   By: Elgie CollardArash  Radparvar M.D.   On:  06/29/2015 21:07   Dg Abd Portable 1v  06/29/2015  CLINICAL DATA:  Feeding tube placement EXAM: PORTABLE ABDOMEN - 1 VIEW COMPARISON:  None. FINDINGS: Feeding tube tip is below the diaphragm with the tip not seen due to limitations from patient inability to cooperate for imaging. Visualized bowel gas pattern grossly normal. IMPRESSION: Feeding tube tip below the diaphragm with the actual tip not appreciable due to limitations of imaging from patient inability to cooperate. Electronically Signed   By: Bretta BangWilliam  Woodruff III M.D.   On: 06/29/2015 17:46     Scheduled Meds: . acyclovir  600 mg Intravenous 3 times per day  . antiseptic oral rinse  7 mL Mouth Rinse BID  . cefTRIAXone (ROCEPHIN)  IV  2 g Intravenous Q12H  . insulin aspart  0-15 Units Subcutaneous Q4H  . insulin glargine  25 Units Subcutaneous QHS  . levETIRAcetam  750 mg Intravenous Q12H  . sodium chloride  3 mL Intravenous Q12H  . vancomycin  1,000 mg Intravenous Q8H   Continuous Infusions: . [START ON 07/01/2015] feeding supplement (JEVITY 1.2 CAL)     PRN Meds: acetaminophen **OR** acetaminophen, haloperidol lactate, morphine injection, ondansetron **OR** ondansetron (ZOFRAN) IV  Time spent: 30 minutes  Author: Lynden OxfordPranav Tae Robak, MD [facility] Pager: [phone] 06/30/2015 1:14 PM  If 7PM-7AM, please contact night-coverage at [email or web address], password [email or web address]

## 2015-06-30 NOTE — Anesthesia Procedure Notes (Signed)
Procedure Name: Intubation Date/Time: 06/30/2015 10:30 AM Performed by: Lovie CholOCK, Christon Parada K Pre-anesthesia Checklist: Patient identified, Emergency Drugs available, Suction available, Patient being monitored and Timeout performed Patient Re-evaluated:Patient Re-evaluated prior to inductionOxygen Delivery Method: Circle system utilized Preoxygenation: Pre-oxygenation with 100% oxygen Intubation Type: IV induction Laryngoscope Size: Miller and 2 Grade View: Grade I Tube type: Oral Tube size: 7.5 mm Number of attempts: 1 Airway Equipment and Method: Stylet Placement Confirmation: ETT inserted through vocal cords under direct vision,  positive ETCO2,  CO2 detector and breath sounds checked- equal and bilateral Secured at: 22 cm Tube secured with: Tape Dental Injury: Teeth and Oropharynx as per pre-operative assessment

## 2015-06-30 NOTE — Anesthesia Preprocedure Evaluation (Addendum)
Anesthesia Evaluation  Patient identified by MRN, date of birth, ID band Patient confused    Reviewed: Allergy & Precautions, NPO status , Patient's Chart, lab work & pertinent test results  History of Anesthesia Complications Negative for: history of anesthetic complications  Airway Mallampati: III  TM Distance: >3 FB Neck ROM: Full    Dental no notable dental hx. (+) Teeth Intact, Dental Advisory Given   Pulmonary shortness of breath and with exertion, asthma , COPD,  COPD inhaler, former smoker,    Pulmonary exam normal breath sounds clear to auscultation       Cardiovascular hypertension, Pt. on medications Normal cardiovascular exam Rhythm:Regular Rate:Normal     Neuro/Psych PSYCHIATRIC DISORDERS Anxiety Depression Altered mental status- ? Etiology ? Sepsis Peripheral neuropathy  Neuromuscular disease    GI/Hepatic Neg liver ROS, GERD  Medicated and Controlled,  Endo/Other  diabetes, Poorly Controlled, Type 2, Insulin Dependent, Oral Hypoglycemic AgentsMorbid obesity  Renal/GU negative Renal ROS     Musculoskeletal  (+) Arthritis , Osteoarthritis,  Fibromyalgia -  Abdominal (+) + obese,   Peds  Hematology   Anesthesia Other Findings   Reproductive/Obstetrics                          Anesthesia Physical Anesthesia Plan  ASA: III  Anesthesia Plan: General and MAC   Post-op Pain Management:    Induction: Intravenous  Airway Management Planned: Oral ETT, Mask and LMA  Additional Equipment:   Intra-op Plan:   Post-operative Plan: Extubation in OR  Informed Consent: I have reviewed the patients History and Physical, chart, labs and discussed the procedure including the risks, benefits and alternatives for the proposed anesthesia with the patient or authorized representative who has indicated his/her understanding and acceptance.   Dental advisory given  Plan Discussed with:  CRNA, Anesthesiologist and Surgeon  Anesthesia Plan Comments:         Anesthesia Quick Evaluation

## 2015-06-30 NOTE — Progress Notes (Signed)
Pt pulled cortec feed tubing out

## 2015-06-30 NOTE — Procedures (Signed)
Technically successful fluoro guided LP with opening pressure of 23 mmHg.  Closing pressure unable to be obtained due to size of spinal needle @ 14 cc of CSF.   Initial sample appeared somewhat cloudy (potentially d/t prior attempted LP), though subsequent samples appeared clear and normal.  All fluid sent to lab for analysis. No immediate post procedural complication.  Katherina RightJay Enrique Manganaro, MD Pager #: [phone]

## 2015-06-30 NOTE — Anesthesia Postprocedure Evaluation (Signed)
Anesthesia Post Note  Patient: Luceil Robel  Procedure(s) Performed: Procedure(s) (LRB): RADIOLOGY WITH ANESTHESIA (N/A)  Patient location during evaluation: PACU Anesthesia Type: General Level of consciousness: awake and alert Pain management: pain level controlled Vital Signs Assessment: post-procedure vital signs reviewed and stable Respiratory status: spontaneous breathing, nonlabored ventilation, respiratory function stable and patient connected to nasal cannula oxygen Cardiovascular status: blood pressure returned to baseline Postop Assessment: no signs of nausea or vomiting Anesthetic complications: no    Last Vitals:  Filed Vitals:   06/30/15 1200 06/30/15 1215  BP: 151/83 126/103  Pulse: 94 94  Temp: 37.1 C   Resp: 17 18    Last Pain:  Filed Vitals:   06/30/15 1220  PainSc: 0-No pain                 Sidhant Helderman A.

## 2015-06-30 NOTE — Progress Notes (Signed)
Patient ID: Valerie Wagner, female   DOB: [birth year], 65 y.o.   MRN: [id number]         [facility]    Date of Admission:  06/17/2015   Total days of antibiotics 5         Ms. Valerie Wagner is currently out of her room. She underwent lumbar puncture under anesthesia this morning. Her opening pressure was 23 cm of water. The CSF was initially cloudy but then cleared. All CSF studies are pending. I will continue vancomycin, ceftriaxone and acyclovir for now.       Cliffton AstersJohn Beryle Zeitz, MD [facility] [phone] pager   [phone] 06/23/2015, 1:32 PM

## 2015-06-30 NOTE — Transfer of Care (Signed)
Immediate Anesthesia Transfer of Care Note  Patient: Valerie Wagner  Procedure(s) Performed: Procedure(s): RADIOLOGY WITH ANESTHESIA (N/A)  Patient Location: PACU  Anesthesia Type:General  Level of Consciousness: awake and patient cooperative  Airway & Oxygen Therapy: Patient Spontanous Breathing and Patient connected to face mask oxygen  Post-op Assessment: Report given to RN and Post -op Vital signs reviewed and stable  Post vital signs: Reviewed  Last Vitals:  Filed Vitals:   06/30/15 0800 06/30/15 1200  BP: 155/86 151/83  Pulse: 80 94  Temp: 37.6 C   Resp: 25 17    Complications: No apparent anesthesia complications

## 2015-07-01 ENCOUNTER — Inpatient Hospital Stay (HOSPITAL_COMMUNITY): Payer: Medicaid Other

## 2015-07-01 DIAGNOSIS — I10 Essential (primary) hypertension: Secondary | ICD-10-CM

## 2015-07-01 DIAGNOSIS — E119 Type 2 diabetes mellitus without complications: Secondary | ICD-10-CM

## 2015-07-01 LAB — GLUCOSE, CAPILLARY
GLUCOSE-CAPILLARY: 140 mg/dL — AB (ref 65–99)
GLUCOSE-CAPILLARY: 144 mg/dL — AB (ref 65–99)
GLUCOSE-CAPILLARY: 137 mg/dL — AB (ref 65–99)
Glucose-Capillary: 152 mg/dL — ABNORMAL HIGH (ref 65–99)
Glucose-Capillary: 153 mg/dL — ABNORMAL HIGH (ref 65–99)
Glucose-Capillary: 153 mg/dL — ABNORMAL HIGH (ref 65–99)

## 2015-07-01 LAB — CBC WITH DIFFERENTIAL/PLATELET
Basophils Absolute: 0 10*3/uL (ref 0.0–0.1)
Basophils Relative: 0 %
Eosinophils Absolute: 0.4 10*3/uL (ref 0.0–0.7)
Eosinophils Relative: 3 %
HEMATOCRIT: 39.2 % (ref 36.0–46.0)
HEMOGLOBIN: 13 g/dL (ref 12.0–15.0)
LYMPHS ABS: 2.4 10*3/uL (ref 0.7–4.0)
LYMPHS PCT: 18 %
MCH: 30.4 pg (ref 26.0–34.0)
MCHC: 33.2 g/dL (ref 30.0–36.0)
MCV: 91.8 fL (ref 78.0–100.0)
MONOS PCT: 6 %
Monocytes Absolute: 0.8 10*3/uL (ref 0.1–1.0)
NEUTROS ABS: 10 10*3/uL — AB (ref 1.7–7.7)
NEUTROS PCT: 73 %
Platelets: 309 10*3/uL (ref 150–400)
RBC: 4.27 MIL/uL (ref 3.87–5.11)
RDW: 12.6 % (ref 11.5–15.5)
WBC: 13.6 10*3/uL — AB (ref 4.0–10.5)

## 2015-07-01 LAB — COMPREHENSIVE METABOLIC PANEL
ALK PHOS: 58 U/L (ref 38–126)
ALT: 55 U/L — AB (ref 14–54)
ANION GAP: 10 (ref 5–15)
AST: 42 U/L — ABNORMAL HIGH (ref 15–41)
Albumin: 3 g/dL — ABNORMAL LOW (ref 3.5–5.0)
BILIRUBIN TOTAL: 0.8 mg/dL (ref 0.3–1.2)
BUN: 6 mg/dL (ref 6–20)
CALCIUM: 9.3 mg/dL (ref 8.9–10.3)
CO2: 29 mmol/L (ref 22–32)
CREATININE: 0.67 mg/dL (ref 0.44–1.00)
Chloride: 102 mmol/L (ref 101–111)
GFR calc non Af Amer: >60 mL/min (ref >60)
Glucose, Bld: 180 mg/dL — ABNORMAL HIGH (ref 65–99)
Potassium: 3.1 mmol/L — ABNORMAL LOW (ref 3.5–5.1)
Sodium: 141 mmol/L (ref 135–145)
TOTAL PROTEIN: 6.4 g/dL — AB (ref 6.5–8.1)

## 2015-07-01 LAB — HERPES SIMPLEX VIRUS(HSV) DNA BY PCR
HSV 1 DNA: NEGATIVE
HSV 2 DNA: NEGATIVE

## 2015-07-01 LAB — PATHOLOGIST SMEAR REVIEW: Path Review: INCREASED

## 2015-07-01 MED ORDER — SODIUM CHLORIDE 0.9 % IV SOLN
500.0000 mg | Freq: Two times a day (BID) | INTRAVENOUS | Status: DC
  Administered 2015-07-01 – 2015-07-02 (×3): 500 mg via INTRAVENOUS
  Filled 2015-07-01 (×4): qty 5

## 2015-07-01 MED ORDER — RESOURCE THICKENUP CLEAR PO POWD
ORAL | Status: DC | PRN
  Filled 2015-07-01: qty 125

## 2015-07-01 MED ORDER — POTASSIUM CHLORIDE 10 MEQ/100ML IV SOLN
10.0000 meq | Freq: Once | INTRAVENOUS | Status: AC
Start: 2015-07-01 — End: 2015-07-01
  Administered 2015-07-01: 10 meq via INTRAVENOUS
  Filled 2015-07-01: qty 100

## 2015-07-01 MED ORDER — POTASSIUM CHLORIDE 10 MEQ/100ML IV SOLN
10.0000 meq | INTRAVENOUS | Status: AC
  Administered 2015-07-01 (×3): 10 meq via INTRAVENOUS
  Filled 2015-07-01 (×3): qty 100

## 2015-07-01 MED ORDER — ENOXAPARIN SODIUM 40 MG/0.4ML ~~LOC~~ SOLN
40.0000 mg | Freq: Every day | SUBCUTANEOUS | Status: DC
  Administered 2015-07-01 – 2015-07-07 (×7): 40 mg via SUBCUTANEOUS
  Filled 2015-07-01 (×7): qty 0.4

## 2015-07-01 NOTE — Progress Notes (Signed)
Speech Pathology   MBSS complete. Full report located under chart review in imaging section. Double click on DG swallow function.  Recommendations:   Mild oral phase dysphagia;Moderate oral phase dysphagia;Mild pharyngeal phase dysphagia   Clinical Impression Mild-moderate oral dysphagia marked by decreased manipulation and delayed transit. Mild-moderate sensorimotor based pharyngeal dysphagia with decreased sensation (mild), reduced tongue base retraction and decreased laryngeal elevation leading to delayed swallow initiation and mild-moderate vallecular and pyriform sinsus residue. Silent (mild) penetration following nectar trial after the initial swallow. No penetration/aspiration with thin barium, however risk is increased due to delayed initiation and an unusual increase in pharyngeal residue with thin compared to nectar. Recommend Dys 2 texture, nectar thick liquds, crush meds, no straws, two swallows and full supervision.   Impact on safety and function Moderate aspiration risk       Royce MacadamiaLisa Willis Kiah Keay M.Ed [employer] Pager [phone]

## 2015-07-01 NOTE — Progress Notes (Signed)
Subjective: Much sleepier today.   Exam: Filed Vitals:   07/01/15 0734 07/01/15 1250  BP: 156/81 159/75  Pulse: 66 65  Temp: 97.8 F (36.6 C) 99 F (37.2 C)  Resp: 18 18   Gen: In bed, NAD Resp: non-labored breathing, no acute distress Abd: soft, nt  Neuro: MS: lethargic, but does awaken and follow commands.  ZO:XWRUECN:PERRL  Motor: Moves all extremities. Does not give good effort for strength testing.  Sensory:endorses sensation bilaterally.   CSF: Elevated protein with mild leukocytosis(12 wbc)  Impression: 65 yo F with fever, leukocytosis, delirium. She has now had multiple days of acyclovir, vancomycin, ceftriaxone with continued encephalopathy. With fevers and confusion, I do think that an infectious etiology is most likely. She appeared to be improving but is more lethargic today. I had increased keppra yesterday and this may be culprit. Will decrease it back.   Recommendations: 1) decrease to 500mg  BID of keppra.  2) will continue to follow.   Ritta SlotMcNeill Kirkpatrick, MD [facility] [phone]  If 7pm- 7am, please page neurology on call as listed in AMION.

## 2015-07-01 NOTE — Progress Notes (Signed)
[facility] Progress Note   Valerie Wagner  WJX:[id number]  DOB: [birth year]  DOA: 06/25/2015 PCP: Donzetta Sprung, MD  Brief narrative: Valerie Wagner is a 65 y.o. female with past medical history of diabetes mellitus type 2, hypertension and obesity who was brought to [facility] for altered mental status. Her husband reported that he had found her on the floor unresponsive. In route to the hospital she was combative and was given a milligram of Versed by EMS. She was found to have severe sepsis with a temperature of 102.6, WBC count of 19.3 and a lactic acid of 3.8. Initial blood pressure was 60/40. She was started on broad-spectrum antibiotics.   Subjective: Quite lethargic. Able to tell me her first name but otherwise not making any sense.  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   Antibiotics: Anti-infectives    Start     Dose/Rate Route Frequency Ordered Stop   06/29/15 2200  vancomycin (VANCOCIN) IVPB 1000 mg/200 mL premix     1,000 mg 200 mL/hr over 60 Minutes Intravenous Every 8 hours 06/29/15 1440     06/26/15 1730  acyclovir  (ZOVIRAX) 600 mg in dextrose 5 % 100 mL IVPB     600 mg 112 mL/hr over 60 Minutes Intravenous 3 times per day 06/26/15 1717     06/26/15 1600  ampicillin (OMNIPEN) 2 g in sodium chloride 0.9 % 50 mL IVPB  Status:  Discontinued     2 g 150 mL/hr over 20 Minutes Intravenous 6 times per day 06/26/15 1529 06/26/15 1658   06/26/15 1530  vancomycin (VANCOCIN) IVPB 1000 mg/200 mL premix  Status:  Discontinued     1,000 mg 200 mL/hr over 60 Minutes Intravenous  Once 06/26/15 1529 06/26/15 1533   06/26/15 1530  cefTRIAXone (ROCEPHIN) 2 g in dextrose 5 % 50 mL IVPB     2 g 100 mL/hr over 30 Minutes Intravenous Every 12 hours 06/26/15 1529     06/26/15 1000  levofloxacin (LEVAQUIN) IVPB 750 mg  Status:  Discontinued     750 mg 100 mL/hr over 90 Minutes Intravenous Every 24 hours 06/25/15 2052 06/26/15 1530   06/26/15 0200  vancomycin (VANCOCIN) IVPB 1000 mg/200 mL premix  Status:  Discontinued     1,000 mg 200 mL/hr over 60 Minutes Intravenous Every 12 hours 06/25/15 2052 06/29/15 1439   06/25/15 2200  aztreonam (AZACTAM) 2 g in dextrose 5 % 50 mL IVPB  Status:  Discontinued     2 g 100 mL/hr over 30 Minutes Intravenous 3 times per day 06/25/15 2052 06/26/15 1530   06/25/15 2100  aztreonam (AZACTAM) 2 g in dextrose 5 % 50 mL IVPB  Status:  Discontinued  2 g 100 mL/hr over 30 Minutes Intravenous  Once 06/25/15 1625 06/25/15 2052   06/25/15 1430  acyclovir (ZOVIRAX) 600 mg in dextrose 5 % 100 mL IVPB     600 mg 112 mL/hr over 60 Minutes Intravenous  Once 06/25/15 1405 06/25/15 1601   06/25/15 1315  vancomycin (VANCOCIN) IVPB 1000 mg/200 mL premix    Comments:  ** Give 1000mg  IV x 2 bags total back to back to = total dose of 2000mg  **   1,000 mg 200 mL/hr over 60 Minutes Intravenous  Once 06/25/15 1115 06/25/15 1459   06/25/15 1215  vancomycin (VANCOCIN) IVPB 1000 mg/200 mL premix    Comments:  ** Give 1000mg  IV x 2 bags total back to back to = total dose of 2000mg  **   1,000 mg 200 mL/hr over  60 Minutes Intravenous  Once 06/25/15 1115 06/25/15 1403   06/25/15 1115  levofloxacin (LEVAQUIN) IVPB 750 mg     750 mg 100 mL/hr over 90 Minutes Intravenous  Once 06/25/15 1106 06/25/15 1336   06/25/15 1115  aztreonam (AZACTAM) 2 g in dextrose 5 % 50 mL IVPB     2 g 100 mL/hr over 30 Minutes Intravenous  Once 06/25/15 1106 06/25/15 1245   06/25/15 1115  vancomycin (VANCOCIN) IVPB 1000 mg/200 mL premix  Status:  Discontinued     1,000 mg 200 mL/hr over 60 Minutes Intravenous  Once 06/25/15 1106 06/25/15 1110     Code Status:     Code Status Orders        Start     Ordered   06/25/15 2147  Full code   Continuous     06/25/15 2146    Code Status History    Date Active Date Inactive Code Status Order ID Comments User Context   This patient has a current code status but no historical code status.    Advance Directive Documentation        Most Recent Value   Type of Advance Directive  Healthcare Power of Attorney [son josh]   Pre-existing out of facility DNR order (yellow form or pink MOST form)     "MOST" Form in Place?       Family Communication:  Disposition Plan: To be determined DVT prophylaxis: Lovenox Consultants: Neurology, ID Procedures: LP    Objective: Filed Weights   06/25/15 2104 06/30/15 0330 07/01/15 0405  Weight: 125.7 kg (277 lb 1.9 oz) 123.7 kg (272 lb 11.3 oz) 122.9 kg (270 lb 15.1 oz)    Intake/Output Summary (Last 24 hours) at 07/01/15 1038 Last data filed at 06/30/15 2104  Gross per 24 hour  Intake   2089 ml  Output    450 ml  Net   1639 ml     Vitals Filed Vitals:   07/01/15 0000 07/01/15 0300 07/01/15 0405 07/01/15 0734  BP: 164/86 150/83  156/81  Pulse: 84 79  66  Temp: 98.2 F (36.8 C)   97.8 F (36.6 C)  TempSrc: Oral   Axillary  Resp: 22 26  18   Height:      Weight:   122.9 kg (270 lb 15.1 oz)   SpO2: 95% 95%  95%    Exam:  General:  Pt is lethargic, not in acute distress  HEENT: No icterus, No thrush, oral mucosa  moist  Cardiovascular: regular rate and rhythm, S1/S2 No murmur  Respiratory: clear to auscultation bilaterally   Abdomen: Soft, +Bowel sounds, non tender, non distended, no guarding  MSK:  No cyanosis or clubbing- no pedal edema   Data Reviewed: Basic Metabolic Panel:  Recent Labs Lab 06/28/15 0402 06/28/15 1623 06/29/15 0327 06/30/15 0300 06/30/15 1001 07/01/15 0748  NA 139 141 141 142 142 141  K 2.9* 4.7 3.5 2.7* 3.2* 3.1*  CL 100* 103 104 102  --  102  CO2 28 24 27 29   --  29  GLUCOSE 201* 216* 148* 157* 162* 180*  BUN 6 8 8 6   --  6  CREATININE 0.66 0.80 0.61 0.64  --  0.67  CALCIUM 9.1 9.6 9.3 9.5  --  9.3  MG  --  2.0  --   --   --   --    Liver Function Tests:  Recent Labs Lab 06/25/15 1105 06/25/15 2306 06/28/15 0402 07/01/15 0748  AST 38 32 45* 42*  ALT 36 30 36 55*  ALKPHOS 81 65 51 58  BILITOT 0.9 0.6 0.6 0.8  PROT 8.1 6.5 6.2* 6.4*  ALBUMIN 4.5 3.6 3.0* 3.0*   No results for input(s): LIPASE, AMYLASE in the last 168 hours.  Recent Labs Lab 06/26/15 1355  AMMONIA 44*   CBC:  Recent Labs Lab 06/25/15 1105 06/25/15 2306 06/26/15 1355 06/27/15 0340 06/28/15 0402 06/29/15 0327 06/30/15 0300 06/30/15 1001 07/01/15 0748  WBC 19.3* 16.4* 13.8* 15.7* 13.0* 13.1* 12.5*  --  13.6*  NEUTROABS 16.7* 14.5* 11.3*  --  8.9*  --   --   --  10.0*  HGB 15.6* 12.7 12.0 13.4 12.9 14.0 13.1 13.9 13.0  HCT 45.1 39.3 36.2 39.9 37.0 40.1 39.7 41.0 39.2  MCV 90.7 90.1 89.8 90.3 89.4 88.7 91.3  --  91.8  PLT 319 254 229 220 242 241 282  --  309   Cardiac Enzymes:  Recent Labs Lab 06/25/15 1933  TROPONINI 0.03   BNP (last 3 results)  Recent Labs  06/26/15 1355  BNP 362.7*    ProBNP (last 3 results) No results for input(s): PROBNP in the last 8760 hours.  CBG:  Recent Labs Lab 06/30/15 1534 06/30/15 2113 07/01/15 0016 07/01/15 0403 07/01/15 0732  GLUCAP 183* 137* 153* 140* 144*    Recent Results (from the past 240 hour(s))  Blood  Culture (routine x 2)     Status: None (Preliminary result)   Collection Time: 06/25/15 11:05 AM  Result Value Ref Range Status   Specimen Description BLOOD LEFT ANTECUBITAL DRAWN BY RN  Final   Special Requests BOTTLES DRAWN AEROBIC AND ANAEROBIC 6CC EACH  Final   Culture NO GROWTH 4 DAYS  Final   Report Status PENDING  Incomplete  Urine culture     Status: None   Collection Time: 06/25/15 11:06 AM  Result Value Ref Range Status   Specimen Description URINE, CATHETERIZED  Final   Special Requests NONE  Final   Culture   Final    NO GROWTH 1 DAY Performed at [facility]    Report Status 06/26/2015 FINAL  Final  Blood Culture (routine x 2)     Status: None (Preliminary result)   Collection Time: 06/25/15 12:21 PM  Result Value Ref Range Status   Specimen Description BLOOD LEFT HAND  Final   Special Requests BOTTLES DRAWN AEROBIC ONLY 4CC  Final   Culture NO GROWTH 4 DAYS  Final   Report Status PENDING  Incomplete  MRSA PCR Screening     Status: None   Collection Time: 06/25/15  9:08 PM  Result Value Ref Range Status  MRSA by PCR NEGATIVE NEGATIVE Final    Comment:        The GeneXpert MRSA Assay (FDA approved for NASAL specimens only), is one component of a comprehensive MRSA colonization surveillance program. It is not intended to diagnose MRSA infection nor to guide or monitor treatment for MRSA infections.   Culture, blood (x 2)     Status: None   Collection Time: 06/25/15 11:06 PM  Result Value Ref Range Status   Specimen Description LEFT ANTECUBITAL  Final   Special Requests BOTTLES DRAWN AEROBIC ONLY 4CC  Final   Culture NO GROWTH 5 DAYS  Final   Report Status 06/30/2015 FINAL  Final  Culture, blood (x 2)     Status: None   Collection Time: 06/25/15 11:16 PM  Result Value Ref Range Status   Specimen Description RIGHT ANTECUBITAL  Final   Special Requests IN PEDIATRIC BOTTLE 3CC  Final   Culture NO GROWTH 5 DAYS  Final   Report Status 06/30/2015 FINAL   Final  CSF culture     Status: None (Preliminary result)   Collection Time: 06/30/15 12:25 PM  Result Value Ref Range Status   Specimen Description CSF  Final   Special Requests NONE  Final   Gram Stain   Final    CYTOSPIN SMEAR WBC PRESENT, PREDOMINANTLY MONONUCLEAR NO ORGANISMS SEEN    Culture NO GROWTH < 24 HOURS  Final   Report Status PENDING  Incomplete     Studies: Ir Fluoro Guide Ndl Plmt / Bx  06/30/2015  INDICATION: Concern for meningoencephalitis. Attempted though ultimately unsuccessful fluoroscopic guided lumbar puncture on 06/27/2015 secondary to patient agitation. Patient returns today for fluoroscopic guided lumbar puncture with general anesthesia. EXAM: FLUOROSCOPIC GUIDED LUMBAR PUNCTURE WITH OPENING AND CLOSING PRESSURES COMPARISON:  Attempted fluoroscopic guided lumbar puncture - 06/27/2015 MEDICATIONS: None ANESTHESIA/SEDATION: General anesthesia, as administered by the anesthesia department CONTRAST:  None. FLUOROSCOPY TIME:  1 minute, 24 seconds (36 mGy) COMPLICATIONS: None immediate TECHNIQUE: Informed written consent was obtained from the patient's family after a discussion of the risks, benefits and alternatives to treatment. Questions regarding the procedure were encouraged and answered. A timeout was performed prior to the initiation of the procedure. The patient was placed prone, slightly RPO on the fluoroscopy table. The left at L2 - L3 transverse foramina was marked fluoroscopically. The skin overlying the operative site was prepped and draped in the usual sterile fashion. Local anesthesia was provided with 1% lidocaine. Under intermittent fluoroscopic guidance, a 6 inch 20 gauge spinal needle was advanced into the spinal canal. Appropriate positioning was confirmed with the efflux of slightly cloudy CSF. Opening pressure was obtained. Approximately 14 ml of cerebrospinal fluid was collected into four separate containers. Closing pressure was unable to be obtained  secondary to the size of the access needle. Initial sample was noted to be slightly cloudy, potentially secondary to prior attempted fluoroscopic guided lumbar puncture though the final 3 samples appeared clear and normal in appearance. All aspirated samples were sent to the Laboratory as requested per the clinical team. The needle was removed and hemostasis was achieved with manual compression. A dressing was placed. The patient tolerated the above procedure well without immediate postprocedural complication. FINDINGS: Fluoroscopic imaging demonstrates appropriate positioning of the spinal needle within the spinal canal. Successful fluoroscopic lumbar puncture yielding approximately 14 ml of clear cerebrospinal fluid. Opening pressure - 23 mmHg Closing pressure - unable to be obtained secondary to the necessary size of the access needle. IMPRESSION: Technically  successful fluoroscopic guided lumbar puncture with opening pressure of 23 mmHg. Samples were sent to the Laboratory as requested per the clinical team. Electronically Signed   By: Simonne Come M.D.   On: 06/30/2015 12:06   Dg Chest Port 1 View  06/30/2015  CLINICAL DATA:  Hypoxia, shortness of breath; sepsis, former smoker. EXAM: PORTABLE CHEST 1 VIEW COMPARISON:  Portable chest x-ray of June 28, 2015 FINDINGS: The lungs remain well-expanded and clear. The heart it remains enlarged. The pulmonary vascularity is not engorged. The mediastinum is normal in width. There is no pleural effusion or pneumothorax. The observed bony thorax exhibits no acute abnormality. IMPRESSION: Interval resolution of pulmonary interstitial edema. Stable mild enlargement of the cardiac silhouette. There is no evidence of pneumonia. Electronically Signed   By: David  [place] M.D.   On: 06/30/2015 09:36   Dg Abd Portable 1v  06/29/2015  CLINICAL DATA:  65 year old female with enteric tube placement. EXAM: PORTABLE ABDOMEN - 1 VIEW COMPARISON:  Earlier radiograph dated  06/29/2015 FINDINGS: An enteric tube is partially visualized with tip in the right lower abdomen, likely in the third portion or distal aspect of the second portion of the duodenum. Air is noted within the colon. Right upper quadrant cholecystectomy clips. The osseous structures are grossly unremarkable. IMPRESSION: Partially visualized enteric tube with tip in the right lower abdomen likely in the duodenum. Electronically Signed   By: Elgie Collard M.D.   On: 06/29/2015 21:07   Dg Abd Portable 1v  06/29/2015  CLINICAL DATA:  Feeding tube placement EXAM: PORTABLE ABDOMEN - 1 VIEW COMPARISON:  None. FINDINGS: Feeding tube tip is below the diaphragm with the tip not seen due to limitations from patient inability to cooperate for imaging. Visualized bowel gas pattern grossly normal. IMPRESSION: Feeding tube tip below the diaphragm with the actual tip not appreciable due to limitations of imaging from patient inability to cooperate. Electronically Signed   By: Bretta Bang III M.D.   On: 06/29/2015 17:46    Scheduled Meds:  Scheduled Meds: . acyclovir  600 mg Intravenous 3 times per day  . antiseptic oral rinse  7 mL Mouth Rinse BID  . cefTRIAXone (ROCEPHIN)  IV  2 g Intravenous Q12H  . insulin aspart  0-15 Units Subcutaneous Q4H  . insulin glargine  25 Units Subcutaneous QHS  . levETIRAcetam  750 mg Intravenous Q12H  . sodium chloride  3 mL Intravenous Q12H  . vancomycin  1,000 mg Intravenous Q8H   Continuous Infusions: . feeding supplement (JEVITY 1.2 CAL)      Time spent on care of this patient: 35 min   Aolanis Crispen, MD 07/01/2015, 10:38 AM  LOS: 6 days   [facility] Office  [phone] Pager - Text Page per [email or web address] If 7PM-7AM, please contact night-coverage [email or web address]

## 2015-07-01 NOTE — Progress Notes (Signed)
Patient ID: Valerie Wagner, female   DOB: 09/05/1950, 65 y.o.   MRN: [id number]         [facility]    Date of Admission:  06/25/2015   Total days of antibiotics 6         Principal Problem:   Acute encephalopathy Active Problems:   Sepsis (HCC)   Fever   Essential hypertension, benign   Severe sepsis (HCC)   Somnolence   Chronic pain   Type 2 diabetes mellitus (HCC)   Polyneuropathy (HCC)   Severe obesity (BMI >= 40) (HCC)   Altered mental state   Type 2 diabetes mellitus without complication, without long-term current use of insulin (HCC)   Meningoencephalitis   Hypoxemia   . acyclovir  600 mg Intravenous 3 times per day  . antiseptic oral rinse  7 mL Mouth Rinse BID  . cefTRIAXone (ROCEPHIN)  IV  2 g Intravenous Q12H  . enoxaparin (LOVENOX) injection  40 mg Subcutaneous Daily  . insulin aspart  0-15 Units Subcutaneous Q4H  . insulin glargine  25 Units Subcutaneous QHS  . levETIRAcetam  750 mg Intravenous Q12H  . potassium chloride  10 mEq Intravenous Q1 Hr x 4  . sodium chloride  3 mL Intravenous Q12H  . vancomycin  1,000 mg Intravenous Q8H   Review of Systems: Review of Systems  Unable to perform ROS: mental acuity    Past Medical History  Diagnosis Date  . Chest pain     Reassuring workup of her time including negative cardiac catheterization and stress testing  . Mixed hyperlipidemia   . Obesity   . Essential hypertension, benign   . COPD (chronic obstructive pulmonary disease) (HCC)   . DJD (degenerative joint disease)   . Urinary incontinence   . GERD (gastroesophageal reflux disease)   . IBS (irritable bowel syndrome)   . Fibromyalgia   . Asthma   . Neuropathy (HCC)   . Shortness of breath dyspnea   . Type 2 diabetes mellitus (HCC)   . Depression   . Anxiety   . DDD (degenerative disc disease), lumbar     Social History  Substance Use Topics  . Smoking status: Former Smoker -- 1.00 packs/day for 2 years    Types:  Cigarettes    Quit date: 06/20/1982  . Smokeless tobacco: Never Used  . Alcohol Use: No    Family History  Problem Relation Age of Onset  . Coronary artery disease Father   . Heart attack Father     Died age 41   Allergies  Allergen Reactions  . Nsaids Shortness Of Breath  . Bee Venom   . Chocolate   . Darvon [Propoxyphene]   . Ivp Dye [Iodinated Diagnostic Agents] Swelling  . Doxycycline Rash  . Penicillins Rash    OBJECTIVE: Filed Vitals:   07/01/15 0000 07/01/15 0300 07/01/15 0405 07/01/15 0734  BP: 164/86 150/83  156/81  Pulse: 84 79  66  Temp: 98.2 F (36.8 C)   97.8 F (36.6 C)  TempSrc: Oral   Axillary  Resp: 22 26  18   Height:      Weight:   270 lb 15.1 oz (122.9 kg)   SpO2: 95% 95%  95%   Body mass index is 46.48 kg/(m^2).  Physical Exam  Constitutional: No distress.  She is alert but confused.  Eyes: Conjunctivae are normal.  Neck: Neck supple.  Cardiovascular: Normal rate and regular rhythm.   No murmur heard. Pulmonary/Chest: Breath  sounds normal.  Abdominal: Soft. There is no tenderness.  Neurological: She is alert.  She remains disoriented.    Lab Results Lab Results  Component Value Date   WBC 13.6* 07/01/2015   HGB 13.0 07/01/2015   HCT 39.2 07/01/2015   MCV 91.8 07/01/2015   PLT 309 07/01/2015    Lab Results  Component Value Date   CREATININE 0.67 07/01/2015   BUN 6 07/01/2015   NA 141 07/01/2015   K 3.1* 07/01/2015   CL 102 07/01/2015   CO2 29 07/01/2015    Lab Results  Component Value Date   ALT 55* 07/01/2015   AST 42* 07/01/2015   ALKPHOS 58 07/01/2015   BILITOT 0.8 07/01/2015     Microbiology: Recent Results (from the past 240 hour(s))  Blood Culture (routine x 2)     Status: None (Preliminary result)   Collection Time: 06/25/15 11:05 AM  Result Value Ref Range Status   Specimen Description BLOOD LEFT ANTECUBITAL DRAWN BY RN  Final   Special Requests BOTTLES DRAWN AEROBIC AND ANAEROBIC 6CC EACH  Final   Culture  NO GROWTH 4 DAYS  Final   Report Status PENDING  Incomplete  Urine culture     Status: None   Collection Time: 06/25/15 11:06 AM  Result Value Ref Range Status   Specimen Description URINE, CATHETERIZED  Final   Special Requests NONE  Final   Culture   Final    NO GROWTH 1 DAY [facility]    Report Status 06/26/2015 FINAL  Final  Blood Culture (routine x 2)     Status: None (Preliminary result)   Collection Time: 06/25/15 12:21 PM  Result Value Ref Range Status   Specimen Description BLOOD LEFT HAND  Final   Special Requests BOTTLES DRAWN AEROBIC ONLY 4CC  Final   Culture NO GROWTH 4 DAYS  Final   Report Status PENDING  Incomplete  MRSA PCR Screening     Status: None   Collection Time: 06/25/15  9:08 PM  Result Value Ref Range Status   MRSA by PCR NEGATIVE NEGATIVE Final    Comment:        The GeneXpert MRSA Assay (FDA approved for NASAL specimens only), is one component of a comprehensive MRSA colonization surveillance program. It is not intended to diagnose MRSA infection nor to guide or monitor treatment for MRSA infections.   Culture, blood (x 2)     Status: None   Collection Time: 06/25/15 11:06 PM  Result Value Ref Range Status   Specimen Description LEFT ANTECUBITAL  Final   Special Requests BOTTLES DRAWN AEROBIC ONLY 4CC  Final   Culture NO GROWTH 5 DAYS  Final   Report Status 06/30/2015 FINAL  Final  Culture, blood (x 2)     Status: None   Collection Time: 06/25/15 11:16 PM  Result Value Ref Range Status   Specimen Description RIGHT ANTECUBITAL  Final   Special Requests IN PEDIATRIC BOTTLE 3CC  Final   Culture NO GROWTH 5 DAYS  Final   Report Status 06/30/2015 FINAL  Final  CSF culture     Status: None (Preliminary result)   Collection Time: 06/30/15 12:25 PM  Result Value Ref Range Status   Specimen Description CSF  Final   Special Requests NONE  Final   Gram Stain   Final    CYTOSPIN SMEAR WBC PRESENT, PREDOMINANTLY  MONONUCLEAR NO ORGANISMS SEEN    Culture NO GROWTH < 24 HOURS  Final  Report Status PENDING  Incomplete     ASSESSMENT: Her lumbar puncture confirms meningoencephalitis but so far all tests are nondiagnostic. She has a predominance of lymphocytes but this could be due to partially treated bacterial meningitis or viral meningoencephalitis. I will continue current antimicrobial therapy pending final cultures and HSV PCR results.  PLAN: 1. Continue current antimicrobial therapy  Cliffton Asters, MD [facility] [phone] pager   [phone] cell 07/01/2015, 11:26 AM

## 2015-07-01 NOTE — Progress Notes (Signed)
Physical Therapy Treatment Patient Details Name: Valerie Wagner MRN: [id number] DOB: 04/23/1951 Today's Date: 07/01/2015    History of Present Illness 65 yo F with fever, leukocytosis, delirium. She has now had multiple days of acyclovir, vancomycin, ceftriaxone with continued encephalopathy. With fevers and confusion, I do think that an infectious etiology is most likely. We could try her LP again, but am concerned that since it has not been able to be accomplished even with ativan previously, it may be difficult to do without more significant sedation. Past medical history of type 2 diabetes mellitus, essential hypertension, chronic back pain and obesity.     PT Comments    Pt admitted with above diagnosis. Pt currently with functional limitations due to balance and endurance deficits. Pt was wet with urine on arrival.  Total assist of 3 persons to clean pt as she is fearful when turned.  Pt with slurred speech and due to pt not assisting with rolling, decided to set pt up in chair position instead of gettting her to EOB.  Will follow acutely.  Pt will benefit from skilled PT to increase their independence and safety with mobility to allow discharge to the venue listed below.    Follow Up Recommendations  Supervision/Assistance - 24 hour;CIR     Equipment Recommendations  Other (comment) (TBA)    Recommendations for Other Services       Precautions / Restrictions Precautions Precautions: Fall Restrictions Weight Bearing Restrictions: No    Mobility  Bed Mobility Overal bed mobility: Needs Assistance;+2 for physical assistance Bed Mobility: Rolling Rolling: Total assist;+2 for physical assistance         General bed mobility comments: Pt was soaked with urine on arrival.  This PT and tech attempted to roll pt however pt would not assist and was grimacing and resisting.  Called for NT and with 3 persons was able to roll pt.  Cleaned pt and changed all linens including gown.  Took  two persons to move pt up in bed with total assist and trendelenberg.  Decided not to sit pt EOB due to lethargy with pt slurring her words.  Very lethargic.  Placed bed in chair position.     Transfers                    Ambulation/Gait                 Stairs            Wheelchair Mobility    Modified Rankin (Stroke Patients Only) Modified Rankin (Stroke Patients Only) Pre-Morbid Rankin Score: Moderate disability Modified Rankin: Severe disability     Balance                                    Cognition Arousal/Alertness: Lethargic Behavior During Therapy: Flat affect Overall Cognitive Status: Impaired/Different from baseline Area of Impairment: Orientation;Memory;Following commands;Awareness;Safety/judgement;Problem solving;Attention Orientation Level: Disoriented to;Situation;Place;Time Current Attention Level: Focused Memory: Decreased short-term memory Following Commands: Follows one step commands inconsistently;Follows one step commands with increased time Safety/Judgement: Decreased awareness of safety;Decreased awareness of deficits Awareness: Intellectual Problem Solving: Difficulty sequencing;Requires verbal cues;Requires tactile cues;Slow processing;Decreased initiation General Comments: Pt with delayed response time. Noted questionable inattention to left hemibody.    Exercises General Exercises - Lower Extremity Ankle Circles/Pumps: AAROM;Both;10 reps;Supine Heel Slides: AAROM;Both;10 reps;Supine    General Comments        Pertinent Vitals/Pain  Pain Assessment: Faces Faces Pain Scale: Hurts even more Pain Location: generalized Pain Descriptors / Indicators: Guarding;Grimacing (with movement) Pain Intervention(s): Limited activity within patient's tolerance;Monitored during session;Premedicated before session;Repositioned  VSS    Home Living                      Prior Function            PT Goals  (current goals can now be found in the care plan section) Progress towards PT goals: Not progressing toward goals - comment (lethargic)    Frequency  Min 3X/week    PT Plan Current plan remains appropriate    Co-evaluation             End of Session Equipment Utilized During Treatment: Gait belt;Oxygen Activity Tolerance: Patient limited by fatigue Patient left: in bed;with call bell/phone within reach;with bed alarm set;with SCD's reapplied     Time: 1012-1033 PT Time Calculation (min) (ACUTE ONLY): 21 min  Charges:  $Therapeutic Activity: 8-22 mins                    G Codes:      Gary Bultman, Alvis Lemmings F July 23, 2015, 2:01 PM [employer] [facility] [phone] (pager)

## 2015-07-02 ENCOUNTER — Encounter (HOSPITAL_COMMUNITY): Payer: Self-pay | Admitting: Interventional Radiology

## 2015-07-02 DIAGNOSIS — G629 Polyneuropathy, unspecified: Secondary | ICD-10-CM

## 2015-07-02 DIAGNOSIS — A419 Sepsis, unspecified organism: Principal | ICD-10-CM

## 2015-07-02 DIAGNOSIS — R6520 Severe sepsis without septic shock: Secondary | ICD-10-CM

## 2015-07-02 LAB — GLUCOSE, CAPILLARY
GLUCOSE-CAPILLARY: 159 mg/dL — AB (ref 65–99)
GLUCOSE-CAPILLARY: 176 mg/dL — AB (ref 65–99)
GLUCOSE-CAPILLARY: 206 mg/dL — AB (ref 65–99)
Glucose-Capillary: 127 mg/dL — ABNORMAL HIGH (ref 65–99)
Glucose-Capillary: 127 mg/dL — ABNORMAL HIGH (ref 65–99)
Glucose-Capillary: 139 mg/dL — ABNORMAL HIGH (ref 65–99)

## 2015-07-02 LAB — VANCOMYCIN, TROUGH: Vancomycin Tr: 18 ug/mL (ref 10.0–20.0)

## 2015-07-02 MED ORDER — DEXTROSE 5 % IV SOLN: 2.0000 g | Freq: Two times a day (BID) | INTRAVENOUS | Status: DC

## 2015-07-02 MED ORDER — HALOPERIDOL LACTATE 5 MG/ML IJ SOLN
5.0000 mg | Freq: Four times a day (QID) | INTRAMUSCULAR | Status: DC | PRN
Start: 2015-07-02 — End: 2015-07-07
  Administered 2015-07-02: 5 mg via INTRAMUSCULAR
  Filled 2015-07-02: qty 1

## 2015-07-02 MED ORDER — VANCOMYCIN HCL IN DEXTROSE 1-5 GM/200ML-% IV SOLN: 1000.0000 mg | Freq: Three times a day (TID) | INTRAVENOUS | Status: DC

## 2015-07-02 NOTE — Progress Notes (Signed)
Patient ID: Valerie Wagner, female   DOB: 1951/05/30, 65 y.o.   MRN: [id number]         [facility]    Date of Admission:  06/25/2015   Total days of antibiotics 7         Principal Problem:   Acute encephalopathy Active Problems:   Sepsis (HCC)   Fever   Essential hypertension, benign   Severe sepsis (HCC)   Somnolence   Chronic pain   Type 2 diabetes mellitus (HCC)   Polyneuropathy (HCC)   Severe obesity (BMI >= 40) (HCC)   Altered mental state   Type 2 diabetes mellitus without complication, without long-term current use of insulin (HCC)   Meningoencephalitis   Hypoxemia   . acyclovir  600 mg Intravenous 3 times per day  . antiseptic oral rinse  7 mL Mouth Rinse BID  . cefTRIAXone (ROCEPHIN)  IV  2 g Intravenous Q12H  . enoxaparin (LOVENOX) injection  40 mg Subcutaneous Daily  . insulin aspart  0-15 Units Subcutaneous Q4H  . insulin glargine  25 Units Subcutaneous QHS  . levETIRAcetam  500 mg Intravenous Q12H  . sodium chloride  3 mL Intravenous Q12H  . vancomycin  1,000 mg Intravenous Q8H    SUBJECTIVE: She states that she is feeling better today. She denies any headache.  Review of Systems: Review of Systems  Unable to perform ROS: mental acuity    Past Medical History  Diagnosis Date  . Chest pain     Reassuring workup of her time including negative cardiac catheterization and stress testing  . Mixed hyperlipidemia   . Obesity   . Essential hypertension, benign   . COPD (chronic obstructive pulmonary disease) (HCC)   . DJD (degenerative joint disease)   . Urinary incontinence   . GERD (gastroesophageal reflux disease)   . IBS (irritable bowel syndrome)   . Fibromyalgia   . Asthma   . Neuropathy (HCC)   . Shortness of breath dyspnea   . Type 2 diabetes mellitus (HCC)   . Depression   . Anxiety   . DDD (degenerative disc disease), lumbar     Social History  Substance Use Topics  . Smoking status: Former Smoker -- 1.00  packs/day for 2 years    Types: Cigarettes    Quit date: 06/20/1982  . Smokeless tobacco: Never Used  . Alcohol Use: No    Family History  Problem Relation Age of Onset  . Coronary artery disease Father   . Heart attack Father     Died age 73   Allergies  Allergen Reactions  . Nsaids Shortness Of Breath  . Bee Venom   . Chocolate   . Darvon [Propoxyphene]   . Ivp Dye [Iodinated Diagnostic Agents] Swelling  . Doxycycline Rash  . Penicillins Rash    OBJECTIVE: Filed Vitals:   07/02/15 0200 07/02/15 0400 07/02/15 0456 07/02/15 0900  BP: 154/71 161/86  161/97  Pulse: 68     Temp:  99.5 F (37.5 C)  96.6 F (35.9 C)  TempSrc:  Axillary  Axillary  Resp: 25   16  Height:      Weight:   267 lb 8 oz (121.337 kg)   SpO2: 93%   96%   Body mass index is 45.89 kg/(m^2).  Physical Exam  Constitutional:  She is alert and smiling. She is in no distress.  Eyes: Conjunctivae are normal.  Neck: Neck supple.  Cardiovascular: Normal rate and regular rhythm.  No murmur heard. Pulmonary/Chest: Breath sounds normal.  Abdominal: Soft. There is no tenderness.  Neurological: She is alert.  She remains confused but is now recognizing some family members.    Lab Results Lab Results  Component Value Date   WBC 13.6* 07/01/2015   HGB 13.0 07/01/2015   HCT 39.2 07/01/2015   MCV 91.8 07/01/2015   PLT 309 07/01/2015    Lab Results  Component Value Date   CREATININE 0.67 07/01/2015   BUN 6 07/01/2015   NA 141 07/01/2015   K 3.1* 07/01/2015   CL 102 07/01/2015   CO2 29 07/01/2015    Lab Results  Component Value Date   ALT 55* 07/01/2015   AST 42* 07/01/2015   ALKPHOS 58 07/01/2015   BILITOT 0.8 07/01/2015     Microbiology: Recent Results (from the past 240 hour(s))  Blood Culture (routine x 2)     Status: None (Preliminary result)   Collection Time: 06/25/15 11:05 AM  Result Value Ref Range Status   Specimen Description BLOOD LEFT ANTECUBITAL DRAWN BY RN  Final    Special Requests BOTTLES DRAWN AEROBIC AND ANAEROBIC 6CC EACH  Final   Culture NO GROWTH 4 DAYS  Final   Report Status PENDING  Incomplete  Urine culture     Status: None   Collection Time: 06/25/15 11:06 AM  Result Value Ref Range Status   Specimen Description URINE, CATHETERIZED  Final   Special Requests NONE  Final   Culture   Final    NO GROWTH 1 DAY Performed at [facility]    Report Status 06/26/2015 FINAL  Final  Blood Culture (routine x 2)     Status: None (Preliminary result)   Collection Time: 06/25/15 12:21 PM  Result Value Ref Range Status   Specimen Description BLOOD LEFT HAND  Final   Special Requests BOTTLES DRAWN AEROBIC ONLY 4CC  Final   Culture NO GROWTH 4 DAYS  Final   Report Status PENDING  Incomplete  MRSA PCR Screening     Status: None   Collection Time: 06/25/15  9:08 PM  Result Value Ref Range Status   MRSA by PCR NEGATIVE NEGATIVE Final    Comment:        The GeneXpert MRSA Assay (FDA approved for NASAL specimens only), is one component of a comprehensive MRSA colonization surveillance program. It is not intended to diagnose MRSA infection nor to guide or monitor treatment for MRSA infections.   Culture, blood (x 2)     Status: None   Collection Time: 06/25/15 11:06 PM  Result Value Ref Range Status   Specimen Description LEFT ANTECUBITAL  Final   Special Requests BOTTLES DRAWN AEROBIC ONLY 4CC  Final   Culture NO GROWTH 5 DAYS  Final   Report Status 06/30/2015 FINAL  Final  Culture, blood (x 2)     Status: None   Collection Time: 06/25/15 11:16 PM  Result Value Ref Range Status   Specimen Description RIGHT ANTECUBITAL  Final   Special Requests IN PEDIATRIC BOTTLE 3CC  Final   Culture NO GROWTH 5 DAYS  Final   Report Status 06/30/2015 FINAL  Final  CSF culture     Status: None (Preliminary result)   Collection Time: 06/30/15 12:25 PM  Result Value Ref Range Status   Specimen Description CSF  Final   Special Requests NONE  Final    Gram Stain   Final    CYTOSPIN SMEAR WBC PRESENT, PREDOMINANTLY MONONUCLEAR NO ORGANISMS SEEN  Culture NO GROWTH < 24 HOURS  Final   Report Status PENDING  Incomplete     ASSESSMENT: She is improving slowly on empiric therapy for meningoencephalitis. Her HSV PCR's are negative. I will stop acyclovir and continue empiric vancomycin and ceftriaxone for 3 more days. She had a recent embedded tick bite on her left forearm. Tickborne encephalitis is not at all, [place]. I will see if we can send blood and CSF to the state for mosquito borne arboviral testing.    PLAN: Continue vancomycin and ceftriaxone for 3 more days Discontinue acyclovir  Cliffton AstersJohn Malaiya Paczkowski, MD [facility] [phone] pager   [phone] cell 07/02/2015, 10:28 AM

## 2015-07-02 NOTE — Progress Notes (Signed)
[facility] Progress Note   Valerie Wagner  XBJ:[id number]  DOB: 1951-02-09  DOA: 06/25/2015 PCP: Donzetta Sprung, MD  Brief narrative: Valerie Wagner is a 65 y.o. female with past medical history of diabetes mellitus type 2, hypertension and obesity who was brought to [facility] for altered mental status. Her husband reported that he had found her on the floor unresponsive. In route to the hospital she was combative and was given a milligram of Versed by EMS. She was found to have severe sepsis with a temperature of 102.6, WBC count of 19.3 and a lactic acid of 3.8. Initial blood pressure was 60/40. She was started on broad-spectrum antibiotics.   Subjective: No complaints.   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   Antibiotics: Anti-infectives    Start     Dose/Rate Route Frequency Ordered Stop   06/29/15 2200  vancomycin (VANCOCIN) IVPB 1000 mg/200 mL premix     1,000 mg 200 mL/hr over 60 Minutes Intravenous Every 8 hours 06/29/15 1440     06/26/15 1730  acyclovir (ZOVIRAX) 600 mg in dextrose 5 % 100 mL IVPB  Status:  Discontinued      600 mg 112 mL/hr over 60 Minutes Intravenous 3 times per day 06/26/15 1717 07/02/15 1031   06/26/15 1600  ampicillin (OMNIPEN) 2 g in sodium chloride 0.9 % 50 mL IVPB  Status:  Discontinued     2 g 150 mL/hr over 20 Minutes Intravenous 6 times per day 06/26/15 1529 06/26/15 1658   06/26/15 1530  vancomycin (VANCOCIN) IVPB 1000 mg/200 mL premix  Status:  Discontinued     1,000 mg 200 mL/hr over 60 Minutes Intravenous  Once 06/26/15 1529 06/26/15 1533   06/26/15 1530  cefTRIAXone (ROCEPHIN) 2 g in dextrose 5 % 50 mL IVPB     2 g 100 mL/hr over 30 Minutes Intravenous Every 12 hours 06/26/15 1529     06/26/15 1000  levofloxacin (LEVAQUIN) IVPB 750 mg  Status:  Discontinued     750 mg 100 mL/hr over 90 Minutes Intravenous Every 24 hours 06/25/15 2052 06/26/15 1530   06/26/15 0200  vancomycin (VANCOCIN) IVPB 1000 mg/200 mL premix  Status:  Discontinued     1,000 mg 200 mL/hr over 60 Minutes Intravenous Every 12 hours 06/25/15 2052 06/29/15 1439   06/25/15 2200  aztreonam (AZACTAM) 2 g in dextrose 5 % 50 mL IVPB  Status:  Discontinued     2 g 100 mL/hr over 30 Minutes Intravenous 3 times per day 06/25/15 2052 06/26/15 1530   06/25/15 2100  aztreonam (AZACTAM) 2 g in dextrose 5 % 50 mL IVPB  Status:  Discontinued     2 g 100 mL/hr over 30 Minutes  Intravenous  Once 06/25/15 1625 06/25/15 2052   06/25/15 1430  acyclovir (ZOVIRAX) 600 mg in dextrose 5 % 100 mL IVPB     600 mg 112 mL/hr over 60 Minutes Intravenous  Once 06/25/15 1405 06/25/15 1601   06/25/15 1315  vancomycin (VANCOCIN) IVPB 1000 mg/200 mL premix    Comments:  ** Give 1000mg  IV x 2 bags total back to back to = total dose of 2000mg  **   1,000 mg 200 mL/hr over 60 Minutes Intravenous  Once 06/25/15 1115 06/25/15 1459   06/25/15 1215  vancomycin (VANCOCIN) IVPB 1000 mg/200 mL premix    Comments:  ** Give 1000mg  IV x 2 bags total back to back to = total dose of 2000mg  **   1,000 mg 200 mL/hr over 60 Minutes Intravenous  Once 06/25/15  1115 06/25/15 1403   06/25/15 1115  levofloxacin (LEVAQUIN) IVPB 750 mg     750 mg 100 mL/hr over 90 Minutes Intravenous  Once 06/25/15 1106 06/25/15 1336   06/25/15 1115  aztreonam (AZACTAM) 2 g in dextrose 5 % 50 mL IVPB     2 g 100 mL/hr over 30 Minutes Intravenous  Once 06/25/15 1106 06/25/15 1245   06/25/15 1115  vancomycin (VANCOCIN) IVPB 1000 mg/200 mL premix  Status:  Discontinued     1,000 mg 200 mL/hr over 60 Minutes Intravenous  Once 06/25/15 1106 06/25/15 1110     Code Status:     Code Status Orders        Start     Ordered   06/25/15 2147  Full code   Continuous     06/25/15 2146    Code Status History    Date Active Date Inactive Code Status Order ID Comments User Context   This patient has a current code status but no historical code status.    Advance Directive Documentation        Most Recent Value   Type of Advance Directive  Healthcare Power of Attorney [son josh]   Pre-existing out of facility DNR order (yellow form or pink MOST form)     "MOST" Form in Place?       Family Communication:  Disposition Plan: To be determined DVT prophylaxis: Lovenox Consultants: Neurology, ID Procedures: LP    Objective: Filed Weights   06/30/15 0330 07/01/15 0405 07/02/15 0456  Weight: 123.7 kg (272 lb 11.3 oz) 122.9 kg (270 lb 15.1 oz) 121.337 kg (267 lb 8 oz)    Intake/Output Summary (Last 24 hours) at 07/02/15 1201 Last data filed at 07/02/15 0430  Gross per 24 hour  Intake   1210 ml  Output      0 ml  Net   1210 ml     Vitals Filed Vitals:   07/02/15 0200 07/02/15 0400 07/02/15 0456 07/02/15 0900  BP: 154/71 161/86  161/97  Pulse: 68     Temp:  99.5 F (37.5 C)  96.6 F (35.9 C)  TempSrc:  Axillary  Axillary  Resp: 25   16  Height:      Weight:   121.337 kg (267 lb 8 oz)   SpO2: 93%   96%    Exam:  General:  Pt is sleepy, not in acute distress  HEENT: No icterus, No thrush, oral mucosa moist  Cardiovascular: regular rate and rhythm,  S1/S2 No murmur  Respiratory: clear to auscultation bilaterally   Abdomen: Soft, +Bowel sounds, non tender, non distended, no guarding  MSK: No cyanosis or clubbing- no  pedal edema   Data Reviewed: Basic Metabolic Panel:  Recent Labs Lab 06/28/15 0402 06/28/15 1623 06/29/15 0327 06/30/15 0300 06/30/15 1001 07/01/15 0748  NA 139 141 141 142 142 141  K 2.9* 4.7 3.5 2.7* 3.2* 3.1*  CL 100* 103 104 102  --  102  CO2 28 24 27 29   --  29  GLUCOSE 201* 216* 148* 157* 162* 180*  BUN 6 8 8 6   --  6  CREATININE 0.66 0.80 0.61 0.64  --  0.67  CALCIUM 9.1 9.6 9.3 9.5  --  9.3  MG  --  2.0  --   --   --   --    Liver Function Tests:  Recent Labs Lab 06/25/15 2306 06/28/15 0402 07/01/15 0748  AST 32 45* 42*  ALT 30 36 55*  ALKPHOS 65 51 58  BILITOT 0.6 0.6 0.8  PROT 6.5 6.2* 6.4*  ALBUMIN 3.6 3.0* 3.0*   No results for input(s): LIPASE, AMYLASE in the last 168 hours.  Recent Labs Lab 06/26/15 1355  AMMONIA 44*   CBC:  Recent Labs Lab 06/25/15 2306 06/26/15 1355 06/27/15 0340 06/28/15 0402 06/29/15 0327 06/30/15 0300 06/30/15 1001 07/01/15 0748  WBC 16.4* 13.8* 15.7* 13.0* 13.1* 12.5*  --  13.6*  NEUTROABS 14.5* 11.3*  --  8.9*  --   --   --  10.0*  HGB 12.7 12.0 13.4 12.9 14.0 13.1 13.9 13.0  HCT 39.3 36.2 39.9 37.0 40.1 39.7 41.0 39.2  MCV 90.1 89.8 90.3 89.4 88.7 91.3  --  91.8  PLT 254 229 220 242 241 282  --  309   Cardiac Enzymes:  Recent Labs Lab 06/25/15 1933  TROPONINI 0.03   BNP (last 3 results)  Recent Labs  06/26/15 1355  BNP 362.7*    ProBNP (last 3 results) No results for input(s): PROBNP in the last 8760 hours.  CBG:  Recent Labs Lab 07/01/15 1643 07/01/15 2021 07/02/15 0014 07/02/15 0433 07/02/15 0812  GLUCAP 137* 152* 159* 127* 127*    Recent Results (from the past 240 hour(s))  Blood Culture (routine x 2)     Status: None (Preliminary result)   Collection Time: 06/25/15 11:05 AM  Result Value Ref Range Status    Specimen Description BLOOD LEFT ANTECUBITAL DRAWN BY RN  Final   Special Requests BOTTLES DRAWN AEROBIC AND ANAEROBIC 6CC EACH  Final   Culture NO GROWTH 4 DAYS  Final   Report Status PENDING  Incomplete  Urine culture     Status: None   Collection Time: 06/25/15 11:06 AM  Result Value Ref Range Status   Specimen Description URINE, CATHETERIZED  Final   Special Requests NONE  Final   Culture   Final    NO GROWTH 1 DAY Performed at [facility]    Report Status 06/26/2015 FINAL  Final  Blood Culture (routine x 2)     Status: None (Preliminary result)   Collection Time: 06/25/15 12:21 PM  Result Value Ref Range Status   Specimen Description BLOOD LEFT HAND  Final   Special Requests BOTTLES DRAWN AEROBIC ONLY 4CC  Final   Culture NO GROWTH 4 DAYS  Final   Report Status PENDING  Incomplete  MRSA PCR Screening     Status: None   Collection Time: 06/25/15  9:08 PM  Result Value Ref Range Status   MRSA by PCR NEGATIVE NEGATIVE Final    Comment:        The GeneXpert  MRSA Assay (FDA approved for NASAL specimens only), is one component of a comprehensive MRSA colonization surveillance program. It is not intended to diagnose MRSA infection nor to guide or monitor treatment for MRSA infections.   Culture, blood (x 2)     Status: None   Collection Time: 06/25/15 11:06 PM  Result Value Ref Range Status   Specimen Description LEFT ANTECUBITAL  Final   Special Requests BOTTLES DRAWN AEROBIC ONLY 4CC  Final   Culture NO GROWTH 5 DAYS  Final   Report Status 06/30/2015 FINAL  Final  Culture, blood (x 2)     Status: None   Collection Time: 06/25/15 11:16 PM  Result Value Ref Range Status   Specimen Description RIGHT ANTECUBITAL  Final   Special Requests IN PEDIATRIC BOTTLE 3CC  Final   Culture NO GROWTH 5 DAYS  Final   Report Status 06/30/2015 FINAL  Final  CSF culture     Status: None (Preliminary result)   Collection Time: 06/30/15 12:25 PM  Result Value Ref Range Status    Specimen Description CSF  Final   Special Requests NONE  Final   Gram Stain   Final    CYTOSPIN SMEAR WBC PRESENT, PREDOMINANTLY MONONUCLEAR NO ORGANISMS SEEN    Culture NO GROWTH 2 DAYS  Final   Report Status PENDING  Incomplete     Studies: Dg Swallowing Func-speech Pathology  07/01/2015  Objective Swallowing Evaluation:   MBS Patient Details Name: Limmie Patriciaonya Kamath MRN: [id number] Date of Birth: [birth year] Today's Date: 07/01/2015 Time: SLP Start Time (ACUTE ONLY): 1200-SLP Stop Time (ACUTE ONLY): 1220 SLP Time Calculation (min) (ACUTE ONLY): 20 min Past Medical History: @PMH @ Past Surgical History: Past Surgical History Procedure Laterality Date . Tonsillectomy   . Hysterectomy -- unknown   . Cholecystectomy   . Carpal tunnel release     Right . Left thumb surgery   . Cesarean section   HPI: 65 y.o. female with past medical history of type 2 diabetes mellitus, essential hypertension, GERD, COPD, fibromyalgia, chronic back pain and obesity admitted for altered mental status, fever and unresponsiveness. Found to be septic, neurologist suspects infectious process, toxic metabolic encephalopathy. CXR lungs hypoexpanded. Vascular congestion and cardiomegaly, with mildly increased interstitial markings, concerning for mild interstitial edema. No Data Recorded Assessment / Plan / Recommendation CHL IP CLINICAL IMPRESSIONS 07/01/2015 Therapy Diagnosis Mild oral phase dysphagia;Moderate oral phase dysphagia;Mild pharyngeal phase dysphagia Clinical Impression Mild-moderate oral dysphagia marked by decreased manipulation and delayed transit. Mild-moderate sensorimotor based pharyngeal dysphagia with decreased sensation (mild), reduced tongue base retraction  and decreased laryngeal elevation leading to delayed swallow initiation and mild-moderate vallecular and pyriform sinsus residue. Silent (mild) penetration following nectar trial after the initial swallow. No penetration/aspiration with thin barium, however risk is  increased due to delayed initiation and an unusual increase in pharyngeal residue with thin compared to nectar. Recommend Dys 2 texture, nectar thick liquds, crush meds, no straws, two swallows and full supervision.  Impact on safety and function Moderate aspiration risk   CHL IP TREATMENT RECOMMENDATION 07/01/2015 Treatment Recommendations Therapy as outlined in treatment plan below   Prognosis 07/01/2015 Prognosis for Safe Diet Advancement Good Barriers to Reach Goals Cognitive deficits Barriers/Prognosis Comment -- CHL IP DIET RECOMMENDATION 07/01/2015 SLP Diet Recommendations Dysphagia 2 (Fine chop) solids;Nectar thick liquid Liquid Administration via Cup;No straw Medication Administration Crushed with puree Compensations Minimize environmental distractions;Slow rate;Small sips/bites;Multiple dry swallows after each bite/sip Postural Changes Remain semi-upright after after feeds/meals (Comment)   CHL IP OTHER  RECOMMENDATIONS 07/01/2015 Recommended Consults -- Oral Care Recommendations Oral care BID Other Recommendations --   CHL IP FOLLOW UP RECOMMENDATIONS 07/01/2015 Follow up Recommendations (No Data)   CHL IP FREQUENCY AND DURATION 07/01/2015 Speech Therapy Frequency (ACUTE ONLY) min 2x/week Treatment Duration 2 weeks      CHL IP ORAL PHASE 07/01/2015 Oral Phase Impaired Oral - Pudding Teaspoon -- Oral - Pudding Cup -- Oral - Honey Teaspoon -- Oral - Honey Cup -- Oral - Nectar Teaspoon Delayed oral transit;Weak lingual manipulation Oral - Nectar Cup Weak lingual manipulation;Delayed oral transit Oral - Nectar Straw -- Oral - Thin Teaspoon Delayed oral transit Oral - Thin Cup Delayed oral transit Oral - Thin Straw -- Oral - Puree -- Oral - Mech Soft -- Oral - Regular Delayed oral transit;Weak lingual manipulation;Impaired mastication Oral - Multi-Consistency -- Oral - Pill -- Oral Phase - Comment --  CHL IP PHARYNGEAL PHASE 07/01/2015 Pharyngeal Phase Impaired Pharyngeal- Pudding Teaspoon -- Pharyngeal -- Pharyngeal-  Pudding Cup -- Pharyngeal -- Pharyngeal- Honey Teaspoon -- Pharyngeal -- Pharyngeal- Honey Cup -- Pharyngeal -- Pharyngeal- Nectar Teaspoon Delayed swallow initiation-pyriform sinuses;Penetration/Apiration after swallow;Pharyngeal residue - pyriform;Pharyngeal residue - valleculae;Reduced laryngeal elevation;Reduced tongue base retraction Pharyngeal Material enters airway, remains ABOVE vocal cords and not ejected out Pharyngeal- Nectar Cup Pharyngeal residue - valleculae;Pharyngeal residue - pyriform;Reduced tongue base retraction;Reduced laryngeal elevation Pharyngeal -- Pharyngeal- Nectar Straw -- Pharyngeal -- Pharyngeal- Thin Teaspoon Pharyngeal residue - pyriform;Pharyngeal residue - valleculae;Reduced tongue base retraction;Reduced laryngeal elevation Pharyngeal -- Pharyngeal- Thin Cup Pharyngeal residue - valleculae;Pharyngeal residue - pyriform;Reduced tongue base retraction;Reduced laryngeal elevation;Delayed swallow initiation-pyriform sinuses Pharyngeal -- Pharyngeal- Thin Straw -- Pharyngeal -- Pharyngeal- Puree -- Pharyngeal -- Pharyngeal- Mechanical Soft -- Pharyngeal -- Pharyngeal- Regular Pharyngeal residue - valleculae;Reduced tongue base retraction Pharyngeal -- Pharyngeal- Multi-consistency -- Pharyngeal -- Pharyngeal- Pill -- Pharyngeal -- Pharyngeal Comment --  CHL IP CERVICAL ESOPHAGEAL PHASE 07/01/2015 Cervical Esophageal Phase Impaired Pudding Teaspoon -- Pudding Cup -- Honey Teaspoon -- Honey Cup -- Nectar Teaspoon -- Nectar Cup -- Nectar Straw -- Thin Teaspoon -- Thin Cup -- Thin Straw -- Puree -- Mechanical Soft -- Regular -- Multi-consistency -- Pill -- Cervical Esophageal Comment -- Royce Macadamia 07/01/2015, 4:08 PM Breck Coons Litaker M.Ed [employer] [phone]               Scheduled Meds:  Scheduled Meds: . antiseptic oral rinse  7 mL Mouth Rinse BID  . cefTRIAXone (ROCEPHIN)  IV  2 g Intravenous Q12H  . enoxaparin (LOVENOX) injection  40 mg Subcutaneous Daily  .  insulin aspart  0-15 Units Subcutaneous Q4H  . insulin glargine  25 Units Subcutaneous QHS  . levETIRAcetam  500 mg Intravenous Q12H  . sodium chloride  3 mL Intravenous Q12H  . vancomycin  1,000 mg Intravenous Q8H   Continuous Infusions:    Time spent on care of this patient: 35 min   Ferris Fielden, MD 07/02/2015, 12:01 PM  LOS: 7 days   [facility] Office  [phone] Pager - Text Page per [email or web address] If 7PM-7AM, please contact night-coverage [email or web address]

## 2015-07-02 NOTE — Progress Notes (Addendum)
Pharmacy Antibiotic Follow-up Note  Valerie Wagner is a 65 y.o. year-old female admitted on 06/25/2015.  The patient is currently on day 7 of vancomycin, ceftriaxone, and acyclovir for LP-confirmed meningoencephalitis.  Patient's renal function has been stable and his WBC is hovering around 13.  Plan: - Vanc 1gm IV Q8H - CTX 2gm IV Q12H - Acyclovir 600 mg IV Q8H - BMET Q72H - Monitor clinical progress, weekly VT to r/o accumulation, narrowing therapy - On dysphagia diet, consider resuming home anti-hypertensive for better BP control    Temp (24hrs), Avg:98.3 F (36.8 C), Min:96.6 F (35.9 C), Max:99.5 F (37.5 C)   Recent Labs Lab 06/27/15 0340 06/28/15 0402 06/29/15 0327 06/30/15 0300 07/01/15 0748  WBC 15.7* 13.0* 13.1* 12.5* 13.6*    Recent Labs Lab 06/28/15 0402 06/28/15 1623 06/29/15 0327 06/30/15 0300 07/01/15 0748  CREATININE 0.66 0.80 0.61 0.64 0.67   Estimated Creatinine Clearance: 93.6 mL/min (by C-G formula based on Cr of 0.67).    Allergies  Allergen Reactions  . Nsaids Shortness Of Breath  . Bee Venom   . Chocolate   . Darvon [Propoxyphene]   . Ivp Dye [Iodinated Diagnostic Agents] Swelling  . Doxycycline Rash  . Penicillins Rash    Antimicrobials this admission: Vanc 1/5>> Acyclovir x1 1/5, 1/6 >> CTX 1/6 >> Aztreonam 1/5 >>1/6 Levaquin 1/5 >> 1/6 Ampicillin 1/6 >>1/6  Levels/dose changes this admission: 1/9 VT = 8 mcg/mL on 1 gm q12 >> incr to 1 gm q8 1/12 VT = 18 mcg/mL >> no change  Microbiology results: 1/5 BCx x2 (04/1199) - NGTD 1/5 BCx x2 (2300) - negative 1/5 UCx - negative 1/10 CSF - NGTD 1/10 Crypto Ag - negative 1/10 HSV - negative    Kamran Coker D. Laney Potashang, PharmD, BCPS Pager:  [phone] 07/02/2015, 10:20 AM

## 2015-07-02 NOTE — Progress Notes (Signed)
Speech Language Pathology Treatment: Dysphagia  Patient Details Name: Valerie Wagner MRN: [id number] DOB: 25-May-1951 Today's Date: 07/02/2015 Time: [id number] SLP Time Calculation (min) (ACUTE ONLY): 22 min  Assessment / Plan / Recommendation Clinical Impression  SLP provided skilled observation at mealtime (dysphagia 2, nectar). Pt presented with oral phase deficits such as oral holding and reduced labial sweep due to cognitive deficits. Delayed throat clear x1 was evident.  Pt required maximum visual, verbal and tactile cueing of compensatory strategies such as multiple swallows, coughing after the swallow, reduced bite size and cues to initiate the swallow. Environmental distractions were minimized, as pt would often lose focus. Cognitive deficits are still present, however pt attempted conversation with the clinician. Pt was been downgraded to dysphagia 1, as certain foods appeared difficult to swallow. Pt will continue to require full supervision and feeding assistance due to impulsivity and cognitive deficits.   HPI HPI: 65 y.o. female with past medical history of type 2 diabetes mellitus, essential hypertension, GERD, COPD, fibromyalgia, chronic back pain and obesity admitted for altered mental status, fever and unresponsiveness. Found to be septic, neurologist suspects infectious process, toxic metabolic encephalopathy. CXR lungs hypoexpanded. Vascular congestion and cardiomegaly, with mildly increased interstitial markings, concerning for mild interstitial edema.      SLP Plan  Continue with current plan of care     Recommendations  Diet recommendations: Nectar-thick liquid;Dysphagia 1 (puree) Liquids provided via: Cup;No straw Medication Administration: Crushed with puree Supervision: Full supervision/cueing for compensatory strategies;Staff to assist with self feeding;Patient able to self feed Compensations: Minimize environmental distractions;Slow rate;Small sips/bites;Multiple dry  swallows after each bite/sip Postural Changes and/or Swallow Maneuvers: Seated upright 90 degrees              Oral Care Recommendations: Oral care QID Follow up Recommendations:  (TBD) Plan: Continue with current plan of care   Valerie [facility] 07/02/2015, 9:37 AM   Valerie LombardLauren Lakeysha Slutsky, Student-SLP

## 2015-07-02 NOTE — Progress Notes (Signed)
Subjective: Much sleepier today.   Exam: Filed Vitals:   07/02/15 0200 07/02/15 0400  BP: 154/71 161/86  Pulse: 68   Temp:  99.5 F (37.5 C)  Resp: 25    Gen: In bed, NAD Resp: non-labored breathing, no acute distress Abd: soft, nt  Neuro: MS: lethargic, but does awaken and follow commands.  ZO:[id number]:PERRL  Motor: Moves all extremities. Does not give good effort for strength testing.  Sensory:endorses sensation bilaterally.   CSF: Elevated protein with mild leukocytosis(12 wbc)  Impression: 65 yo F with meningoencephalitis. She has now had multiple days of acyclovir, vancomycin, ceftriaxone with continued encephalopathy. With fevers and confusion, I do think that an infectious etiology is most likely. She got sedated with keppra at 750mg  bid, but appears much more awake today.   Recommendations: 1) continue keppra 500mg  BID 2) abx per ID 3) will continue to follow.   Ritta SlotMcNeill Thong Feeny, MD [facility] [phone]  If 7pm- 7am, please page neurology on call as listed in AMION.

## 2015-07-02 NOTE — Consult Note (Signed)
Physical Medicine and Rehabilitation Consult Reason for Consult: Acute encephalopathy Referring Physician: [facility]   HPI: Valerie Wagner is a 65 y.o. right handed female with history of diabetes mellitus, hypertension, chronic back pain. By chart review patient lives with family reported to be independent prior to admission. Using a single-point cane. One level home with 1 step to entry. Admitted 06/25/2015 with fever and somnolence. By report she was found on the floor by her husband . Patient with recent embedded tick bite on her left forearm. Fever 102.9, WBC 19,300. Code sepsis was initiated. Cranial CT scan negative. Chest x-ray with mild right lower lobe atelectasis without effusion. CT abdomen and pelvis no acute abnormality or pelvic pathology. MRI negative. EEG was moderately severe generalized slowing no seizure activity. Currently remains on Keppra for seizure prophylaxis. 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 At bedtime HSV PCR negative. Patient currently maintained on broad-spectrum antibiotics. Subcutaneous Lovenox for DVT prophylaxis. Dysphagia one nectar thick liquid diet. Physical therapy evaluations completed ongoing with recommendations of physical medicine rehabilitation consult.  Per family has been talking only for the last couple days. Patient has been confused in trying to get out of bed when family leaving the room.  Review of Systems  Constitutional: Positive for fever. Negative for chills.  HENT: Negative for hearing loss.   Eyes: Negative for blurred vision and double vision.  Respiratory: Negative for cough.        Shortness of breath with exertion  Cardiovascular: Positive for palpitations and leg swelling. Negative for chest pain.  Gastrointestinal: Positive for constipation. Negative for nausea and vomiting.       GERD  Genitourinary: Negative for dysuria and hematuria.  Musculoskeletal: Positive for myalgias and joint pain.  Skin:  Negative for rash.  Neurological: Positive for weakness and headaches. Negative for loss of consciousness.  Psychiatric/Behavioral: Positive for depression.       Anxiety   All other systems reviewed and are negative.  Past Medical History  Diagnosis Date  . Chest pain     Reassuring workup of her time including negative cardiac catheterization and stress testing  . Mixed hyperlipidemia   . Obesity   . Essential hypertension, benign   . COPD (chronic obstructive pulmonary disease) (HCC)   . DJD (degenerative joint disease)   . Urinary incontinence   . GERD (gastroesophageal reflux disease)   . IBS (irritable bowel syndrome)   . Fibromyalgia   . Asthma   . Neuropathy (HCC)   . Shortness of breath dyspnea   . Type 2 diabetes mellitus (HCC)   . Depression   . Anxiety   . DDD (degenerative disc disease), lumbar    Past Surgical History  Procedure Laterality Date  . Tonsillectomy    . Hysterectomy -- unknown    . Cholecystectomy    . Carpal tunnel release      Right  . Left thumb surgery    . Cesarean section    . Radiology with anesthesia N/A 06/30/2015    Procedure: RADIOLOGY WITH ANESTHESIA;  Surgeon: Simonne ComeJohn Watts, MD;  Location: [facility];  Service: Radiology;  Laterality: N/A;   Family History  Problem Relation Age of Onset  . Coronary artery disease Father   . Heart attack Father     Died age 63   Social History:  reports that she quit smoking about 33 years ago. Her smoking use included Cigarettes. She has a 2 pack-year smoking history. She has never used smokeless tobacco.  She reports that she does not drink alcohol or use illicit drugs. Allergies:  Allergies  Allergen Reactions  . Nsaids Shortness Of Breath  . Bee Venom   . Chocolate   . Darvon [Propoxyphene]   . Ivp Dye [Iodinated Diagnostic Agents] Swelling  . Doxycycline Rash  . Penicillins Rash   Medications Prior to Admission  Medication Sig Dispense Refill  . albuterol (VENTOLIN HFA) 108 (90 BASE)  MCG/ACT inhaler Inhale 2 puffs into the lungs every 6 (six) hours as needed for wheezing or shortness of breath.     . ALPRAZolam (XANAX) 0.5 MG tablet Take 0.5 mg by mouth 3 (three) times daily as needed for anxiety or sleep.     . benazepril-hydrochlorthiazide (LOTENSIN HCT) 20-12.5 MG per tablet Take 2 tablets by mouth daily.     Marland Kitchen EPINEPHrine (EPIPEN JR) 0.15 MG/0.3ML injection Inject 0.15 mg into the muscle as needed for anaphylaxis.    Marland Kitchen Fluticasone-Salmeterol (ADVAIR) 500-50 MCG/DOSE AEPB Inhale 1 puff into the lungs every 12 (twelve) hours.    Marland Kitchen glipiZIDE (GLUCOTROL XL) 10 MG 24 hr tablet Take 10 mg by mouth daily with breakfast.    . HYDROcodone-acetaminophen (NORCO) 7.5-325 MG per tablet Take 1 tablet by mouth every 6 (six) hours as needed for moderate pain.    Marland Kitchen insulin glargine (LANTUS) 100 UNIT/ML injection Inject 70 Units into the skin at bedtime.     Marland Kitchen ipratropium-albuterol (DUONEB) 0.5-2.5 (3) MG/3ML SOLN Take 3 mLs by nebulization every 4 (four) hours as needed (wheezing).     . nitroGLYCERIN (NITROSTAT) 0.4 MG SL tablet Place 0.4 mg under the tongue every 5 (five) minutes as needed for chest pain.     Marland Kitchen Potassium Citrate (UROCIT-K 15) 15 MEQ (1620 MG) TBCR Take 1 tablet by mouth 2 (two) times daily.    . pregabalin (LYRICA) 50 MG capsule Take 50 mg by mouth 2 (two) times daily.    Marland Kitchen tiotropium (SPIRIVA) 18 MCG inhalation capsule Place 18 mcg into inhaler and inhale daily.      Home: Home Living Family/patient expects to be discharged to:: Private residence Living Arrangements: Spouse/significant other, Children, Non-relatives/Friends Available Help at Discharge: Family, Available PRN/intermittently Type of Home: House Home Access: Stairs to enter [employer] of Steps: 1 Entrance Stairs-Rails: None Home Layout: One level Home Equipment: Cane - single point Additional Comments: Pt questionable historian.  Confusion noted.  Functional History: Prior  Function Level of Independence: Independent Comments: unsure as pt a questionable historian Functional Status:  Mobility: Bed Mobility Overal bed mobility: Needs Assistance, +2 for physical assistance Bed Mobility: Rolling Rolling: Total assist, +2 for physical assistance Supine to sit: Total assist, +2 for physical assistance, HOB elevated Sit to supine: Total assist, +2 for physical assistance General bed mobility comments: Pt was soaked with urine on arrival.  This PT and tech attempted to roll pt however pt would not assist and was grimacing and resisting.  Called for NT and with 3 persons was able to roll pt.  Cleaned pt and changed all linens including gown.  Took two persons to move pt up in bed with total assist and trendelenberg.  Decided not to sit pt EOB due to lethargy with pt slurring her words.  Very lethargic.  Placed bed in chair position.           ADL: ADL Overall ADL's : Needs assistance/impaired Eating/Feeding: NPO Grooming: Wash/dry face, Maximal assistance Grooming Details (indicate cue type and reason): delayed command following and once  she did with multi-modal cues (verbal, gestural, and textile) she only minmially washed her face Upper Body Bathing: Total assistance, Sitting Lower Body Bathing: Total assistance, Bed level Upper Body Dressing : Total assistance, Sitting Lower Body Dressing: Total assistance, Bed level  Cognition: Cognition Overall Cognitive Status: Impaired/Different from baseline Orientation Level: Oriented to person, Disoriented to place, Disoriented to time, Disoriented to situation Cognition Arousal/Alertness: Lethargic Behavior During Therapy: Flat affect Overall Cognitive Status: Impaired/Different from baseline Area of Impairment: Orientation, Memory, Following commands, Awareness, Safety/judgement, Problem solving, Attention Orientation Level: Disoriented to, Situation, Place, Time Current Attention Level: Focused Memory:  Decreased short-term memory Following Commands: Follows one step commands inconsistently, Follows one step commands with increased time Safety/Judgement: Decreased awareness of safety, Decreased awareness of deficits Awareness: Intellectual Problem Solving: Difficulty sequencing, Requires verbal cues, Requires tactile cues, Slow processing, Decreased initiation General Comments: Pt with delayed response time. Noted questionable inattention to left hemibody.  Blood pressure 161/97, pulse 68, temperature 96.6 F (35.9 C), temperature source Axillary, resp. rate 16, height 5\' 4"  (1.626 m), weight 121.337 kg (267 lb 8 oz), SpO2 96 %. Physical Exam  Constitutional:  65 year old lethargic obese female  HENT:  Head: Normocephalic.  Eyes: EOM are normal.  Neck: Normal range of motion. Neck supple. No thyromegaly present.  Cardiovascular: Normal rate and regular rhythm.   Respiratory: Effort normal and breath sounds normal. No respiratory distress.  GI: Soft. Bowel sounds are normal. She exhibits no distension.  Neurological:  Lethargic but arousable. Limited participation with exam. Very limited medical historian. Follows simple commands.  Skin: Skin is warm and dry.  Oriented to person, thinks she is at [facility], oriented to month and year. Not oriented to situation Motor strength is 4/5 bilateral deltoid, biceps, triceps, grip, hip flexor, knee extensor, ankle dorsiflexor and plantar flexor Sensation difficult to assess secondary to poor attention and concentration. Will grimace and withdraw to pinch in bilateral upper and lower extremities Sitting balance is fair  Results for orders placed or performed during the hospital encounter of 06/25/15 (from the past 24 hour(s))  Glucose, capillary     Status: Abnormal   Collection Time: 07/01/15 12:44 PM  Result Value Ref Range   Glucose-Capillary 153 (H) 65 - 99 mg/dL  Glucose, capillary     Status: Abnormal   Collection Time: 07/01/15   4:43 PM  Result Value Ref Range   Glucose-Capillary 137 (H) 65 - 99 mg/dL  Glucose, capillary     Status: Abnormal   Collection Time: 07/01/15  8:21 PM  Result Value Ref Range   Glucose-Capillary 152 (H) 65 - 99 mg/dL  Glucose, capillary     Status: Abnormal   Collection Time: 07/02/15 12:14 AM  Result Value Ref Range   Glucose-Capillary 159 (H) 65 - 99 mg/dL  Glucose, capillary     Status: Abnormal   Collection Time: 07/02/15  4:33 AM  Result Value Ref Range   Glucose-Capillary 127 (H) 65 - 99 mg/dL  Vancomycin, trough     Status: None   Collection Time: 07/02/15  5:24 AM  Result Value Ref Range   Vancomycin Tr 18 10.0 - 20.0 ug/mL  Glucose, capillary     Status: Abnormal   Collection Time: 07/02/15  8:12 AM  Result Value Ref Range   Glucose-Capillary 127 (H) 65 - 99 mg/dL   Dg Swallowing Func-speech Pathology  07/01/2015  Objective Swallowing Evaluation:   MBS Patient Details Name: Ivonne Freeburg MRN: [id number] Date of Birth: [birth year] Today's Date:  07/01/2015 Time: SLP Start Time (ACUTE ONLY): 1200-SLP Stop Time (ACUTE ONLY): 1220 SLP Time Calculation (min) (ACUTE ONLY): 20 min Past Medical History: @PMH @ Past Surgical History: Past Surgical History Procedure Laterality Date . Tonsillectomy   . Hysterectomy -- unknown   . Cholecystectomy   . Carpal tunnel release     Right . Left thumb surgery   . Cesarean section   HPI: 65 y.o. female with past medical history of type 2 diabetes mellitus, essential hypertension, GERD, COPD, fibromyalgia, chronic back pain and obesity admitted for altered mental status, fever and unresponsiveness. Found to be septic, neurologist suspects infectious process, toxic metabolic encephalopathy. CXR lungs hypoexpanded. Vascular congestion and cardiomegaly, with mildly increased interstitial markings, concerning for mild interstitial edema. No Data Recorded Assessment / Plan / Recommendation CHL IP CLINICAL IMPRESSIONS 07/01/2015 Therapy Diagnosis Mild oral phase  dysphagia;Moderate oral phase dysphagia;Mild pharyngeal phase dysphagia Clinical Impression Mild-moderate oral dysphagia marked by decreased manipulation and delayed transit. Mild-moderate sensorimotor based pharyngeal dysphagia with decreased sensation (mild), reduced tongue base retraction  and decreased laryngeal elevation leading to delayed swallow initiation and mild-moderate vallecular and pyriform sinsus residue. Silent (mild) penetration following nectar trial after the initial swallow. No penetration/aspiration with thin barium, however risk is increased due to delayed initiation and an unusual increase in pharyngeal residue with thin compared to nectar. Recommend Dys 2 texture, nectar thick liquds, crush meds, no straws, two swallows and full supervision.  Impact on safety and function Moderate aspiration risk   CHL IP TREATMENT RECOMMENDATION 07/01/2015 Treatment Recommendations Therapy as outlined in treatment plan below   Prognosis 07/01/2015 Prognosis for Safe Diet Advancement Good Barriers to Reach Goals Cognitive deficits Barriers/Prognosis Comment -- CHL IP DIET RECOMMENDATION 07/01/2015 SLP Diet Recommendations Dysphagia 2 (Fine chop) solids;Nectar thick liquid Liquid Administration via Cup;No straw Medication Administration Crushed with puree Compensations Minimize environmental distractions;Slow rate;Small sips/bites;Multiple dry swallows after each bite/sip Postural Changes Remain semi-upright after after feeds/meals (Comment)   CHL IP OTHER RECOMMENDATIONS 07/01/2015 Recommended Consults -- Oral Care Recommendations Oral care BID Other Recommendations --   CHL IP FOLLOW UP RECOMMENDATIONS 07/01/2015 Follow up Recommendations (No Data)   CHL IP FREQUENCY AND DURATION 07/01/2015 Speech Therapy Frequency (ACUTE ONLY) min 2x/week Treatment Duration 2 weeks      CHL IP ORAL PHASE 07/01/2015 Oral Phase Impaired Oral - Pudding Teaspoon -- Oral - Pudding Cup -- Oral - Honey Teaspoon -- Oral - Honey Cup --  Oral - Nectar Teaspoon Delayed oral transit;Weak lingual manipulation Oral - Nectar Cup Weak lingual manipulation;Delayed oral transit Oral - Nectar Straw -- Oral - Thin Teaspoon Delayed oral transit Oral - Thin Cup Delayed oral transit Oral - Thin Straw -- Oral - Puree -- Oral - Mech Soft -- Oral - Regular Delayed oral transit;Weak lingual manipulation;Impaired mastication Oral - Multi-Consistency -- Oral - Pill -- Oral Phase - Comment --  CHL IP PHARYNGEAL PHASE 07/01/2015 Pharyngeal Phase Impaired Pharyngeal- Pudding Teaspoon -- Pharyngeal -- Pharyngeal- Pudding Cup -- Pharyngeal -- Pharyngeal- Honey Teaspoon -- Pharyngeal -- Pharyngeal- Honey Cup -- Pharyngeal -- Pharyngeal- Nectar Teaspoon Delayed swallow initiation-pyriform sinuses;Penetration/Apiration after swallow;Pharyngeal residue - pyriform;Pharyngeal residue - valleculae;Reduced laryngeal elevation;Reduced tongue base retraction Pharyngeal Material enters airway, remains ABOVE vocal cords and not ejected out Pharyngeal- Nectar Cup Pharyngeal residue - valleculae;Pharyngeal residue - pyriform;Reduced tongue base retraction;Reduced laryngeal elevation Pharyngeal -- Pharyngeal- Nectar Straw -- Pharyngeal -- Pharyngeal- Thin Teaspoon Pharyngeal residue - pyriform;Pharyngeal residue - valleculae;Reduced tongue base retraction;Reduced laryngeal elevation Pharyngeal -- Pharyngeal- Thin Cup  Pharyngeal residue - valleculae;Pharyngeal residue - pyriform;Reduced tongue base retraction;Reduced laryngeal elevation;Delayed swallow initiation-pyriform sinuses Pharyngeal -- Pharyngeal- Thin Straw -- Pharyngeal -- Pharyngeal- Puree -- Pharyngeal -- Pharyngeal- Mechanical Soft -- Pharyngeal -- Pharyngeal- Regular Pharyngeal residue - valleculae;Reduced tongue base retraction Pharyngeal -- Pharyngeal- Multi-consistency -- Pharyngeal -- Pharyngeal- Pill -- Pharyngeal -- Pharyngeal Comment --  CHL IP CERVICAL ESOPHAGEAL PHASE 07/01/2015 Cervical Esophageal Phase Impaired  Pudding Teaspoon -- Pudding Cup -- Honey Teaspoon -- Honey Cup -- Nectar Teaspoon -- Nectar Cup -- Nectar Straw -- Thin Teaspoon -- Thin Cup -- Thin Straw -- Puree -- Mechanical Soft -- Regular -- Multi-consistency -- Pill -- Cervical Esophageal Comment -- Royce Macadamia 07/01/2015, 4:08 PM Breck Coons Litaker M.Ed CCC-SLP Pager [phone]               Assessment/Plan: Diagnosis: Cognitive deficits, gait disorder, 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 Does the need for close, 24 hr/day medical supervision in concert with the patient's rehab needs make it unreasonable for this patient to be served in a less intensive setting? Yes and Potentially 2. Co-Morbidities requiring supervision/potential complications: Diabetes with neuropathy, hypertension, obesity 3. Due to bladder management, bowel management, safety, skin/wound care, disease management, medication administration, pain management and patient education, does the patient require 24 hr/day rehab nursing? Yes 4. Does the patient require coordinated care of a physician, rehab nurse, PT (1-2 hrs/day, 5 days/week), OT (1-2 hrs/day, 5 days/week) and SLP (0.5-1 hrs/day, 5 days/week) to address physical and functional deficits in the context of the above medical diagnosis(es)? Yes Addressing deficits in the following areas: balance, endurance, locomotion, strength, transferring, bowel/bladder control, bathing, dressing, feeding, grooming, toileting, cognition, speech, language, swallowing and psychosocial support 5. Can the patient actively participate in an intensive therapy program of at least 3 hrs of therapy per day at least 5 days per week? No 6. The potential for patient to make measurable gains while on inpatient rehab is should be good once patient is able to adequately attend and participate in rehabilitation 7. Anticipated functional outcomes upon discharge from inpatient rehab are supervision  with PT, supervision with OT,  supervision with SLP. 8. Estimated rehab length of stay to reach the above functional goals is: 10-14 days 9. Does the patient have adequate social supports and living environment to accommodate these discharge functional goals? Yes and Potentially 10. Anticipated D/C setting: Home 11. Anticipated post D/C treatments: HH therapy 12. Overall Rehab/Functional Prognosis: good  RECOMMENDATIONS: This patient's condition is appropriate for continued rehabilitative care in the following setting: CIR Patient has agreed to participate in recommended program. N/A Note that insurance prior authorization may be required for reimbursement for recommended care.  Comment: Husband had limited ability to understand conversation about patient's deficits and rehabilitation process. He called his son over and we discussed. Patient should be ready in 1-2days If tolerating up in chair and able to test with PT OT    07/02/2015

## 2015-07-02 NOTE — Progress Notes (Signed)
Speech Language Pathology  Patient Details Name: Valerie Wagner MRN: [id number] DOB: [birth year] Today's Date: 07/02/2015 Time:  -     Attempted FEES. Pt moving head and pulled scope out of nare. Re-attempted several times without success. Pt scheduled for 11:30 to have MBS in the bariatric xray suite.                Royce MacadamiaLitaker, Evonte Prestage Willis 07/02/2015, 8:38 AM

## 2015-07-03 LAB — GLUCOSE, CAPILLARY
GLUCOSE-CAPILLARY: 99 mg/dL (ref 65–99)
GLUCOSE-CAPILLARY: 108 mg/dL — AB (ref 65–99)
GLUCOSE-CAPILLARY: 148 mg/dL — AB (ref 65–99)
GLUCOSE-CAPILLARY: 205 mg/dL — AB (ref 65–99)
Glucose-Capillary: 136 mg/dL — ABNORMAL HIGH (ref 65–99)
Glucose-Capillary: 227 mg/dL — ABNORMAL HIGH (ref 65–99)

## 2015-07-03 LAB — BASIC METABOLIC PANEL
ANION GAP: 10 (ref 5–15)
BUN: 7 mg/dL (ref 6–20)
CALCIUM: 10 mg/dL (ref 8.9–10.3)
CO2: 27 mmol/L (ref 22–32)
CREATININE: 0.64 mg/dL (ref 0.44–1.00)
Chloride: 105 mmol/L (ref 101–111)
Glucose, Bld: 114 mg/dL — ABNORMAL HIGH (ref 65–99)
Potassium: 3.3 mmol/L — ABNORMAL LOW (ref 3.5–5.1)
SODIUM: 142 mmol/L (ref 135–145)

## 2015-07-03 LAB — CULTURE, BLOOD (ROUTINE X 2)
CULTURE: NO GROWTH
CULTURE: NO GROWTH

## 2015-07-03 LAB — CSF CULTURE W GRAM STAIN: Culture: NO GROWTH

## 2015-07-03 LAB — CSF CULTURE

## 2015-07-03 MED ORDER — LEVETIRACETAM 100 MG/ML PO SOLN
500.0000 mg | Freq: Two times a day (BID) | ORAL | Status: DC
  Administered 2015-07-04 – 2015-07-06 (×6): 500 mg via ORAL
  Filled 2015-07-03 (×6): qty 5

## 2015-07-03 MED ORDER — VANCOMYCIN HCL IN DEXTROSE 1-5 GM/200ML-% IV SOLN
1000.0000 mg | Freq: Three times a day (TID) | INTRAVENOUS | Status: AC
  Administered 2015-07-03 – 2015-07-05 (×8): 1000 mg via INTRAVENOUS
  Filled 2015-07-03 (×15): qty 200

## 2015-07-03 MED ORDER — DEXTROSE 5 % IV SOLN
2.0000 g | Freq: Two times a day (BID) | INTRAVENOUS | Status: AC
  Administered 2015-07-03 – 2015-07-05 (×6): 2 g via INTRAVENOUS
  Filled 2015-07-03 (×11): qty 2

## 2015-07-03 MED ORDER — LEVETIRACETAM 500 MG PO TABS
500.0000 mg | ORAL_TABLET | Freq: Two times a day (BID) | ORAL | Status: DC
  Administered 2015-07-03: 500 mg via ORAL
  Filled 2015-07-03 (×2): qty 1

## 2015-07-03 MED ORDER — INSULIN ASPART 100 UNIT/ML ~~LOC~~ SOLN
0.0000 [IU] | Freq: Three times a day (TID) | SUBCUTANEOUS | Status: DC
  Administered 2015-07-03: 2 [IU] via SUBCUTANEOUS
  Administered 2015-07-03: 5 [IU] via SUBCUTANEOUS
  Administered 2015-07-04 (×2): 3 [IU] via SUBCUTANEOUS
  Administered 2015-07-05: 5 [IU] via SUBCUTANEOUS
  Administered 2015-07-05: 2 [IU] via SUBCUTANEOUS
  Administered 2015-07-05: 3 [IU] via SUBCUTANEOUS
  Administered 2015-07-06: 15 [IU] via SUBCUTANEOUS
  Administered 2015-07-06 – 2015-07-07 (×3): 5 [IU] via SUBCUTANEOUS
  Administered 2015-07-07: 3 [IU] via SUBCUTANEOUS

## 2015-07-03 MED ORDER — POTASSIUM CHLORIDE CRYS ER 20 MEQ PO TBCR
40.0000 meq | EXTENDED_RELEASE_TABLET | Freq: Once | ORAL | Status: AC
  Administered 2015-07-03: 40 meq via ORAL
  Filled 2015-07-03: qty 2

## 2015-07-03 NOTE — Progress Notes (Signed)
Inpatient Rehabilitation  Met with patient to discuss potential IP Rehab admission and left booklets at bedside.  Given difficulty communicating with patient also phoned husband and son as well and shared MD recommendations for 24/7 supervision.  Son and husband plan to discuss over the weekend and my co-worker Gerlean Ren will follow up 1/16.     Carmelia Roller., CCC/SLP Admission Coordinator  [facility]  Cell [phone]

## 2015-07-03 NOTE — Progress Notes (Signed)
Occupational Therapy Treatment Patient Details Name: Valerie Wagner MRN: [id number] DOB: May 12, 1951 Today's Date: 07/03/2015    History of present illness 65 yo F with fever, leukocytosis, delirium. She has now had multiple days of acyclovir, vancomycin, ceftriaxone with continued encephalopathy. With fevers and confusion, I do think that an infectious etiology is most likely. We could try her LP again, but am concerned that since it has not been able to be accomplished even with ativan previously, it may be difficult to do without more significant sedation. Past medical history of type 2 diabetes mellitus, essential hypertension, chronic back pain and obesity.    OT comments  This 65 yo female admitted with above presents to acute OT today making progress with basic ADLs, toilet transfers, bed mobility, following commands, and sitting balance. She will continue to benefit from acute OT with follow up on CIR.  Follow Up Recommendations  CIR    Equipment Recommendations   (TBD next venue)       Precautions / Restrictions Precautions Precautions: Fall Restrictions Weight Bearing Restrictions: No       Mobility Bed Mobility Overal bed mobility: Needs Assistance Bed Mobility: Supine to Sit;Rolling Rolling: Supervision   Supine to sit: Mod assist (with increased time)        Transfers Overall transfer level: Needs assistance Equipment used: Rolling walker (2 wheeled) Transfers: Sit to/from [employer]Stand;Stand Pivot Transfers Sit to Stand: Max assist (+3 (block left knee)) Stand pivot transfers: Max assist (+3 block left knee and help get hips shifted around )            Balance Overall balance assessment: Needs assistance Sitting-balance support: No upper extremity supported;Feet supported Sitting balance-Leahy Scale: Fair Sitting balance - Comments: Pt sat EOB for 10 minutes working on UBB and grooming.   Standing balance support: Bilateral upper extremity supported Standing  balance-Leahy Scale: Zero Standing balance comment: +3 A for sit<>stand                   ADL Overall ADL's : Needs assistance/impaired     Grooming: Wash/dry face;Sitting;Brushing hair;Moderate assistance   Upper Body Bathing: Moderate assistance;Sitting   Lower Body Bathing: Total assistance (with total A +2 to maintain standing)           Toilet Transfer: Stand-pivot (total A+3 blocking left knee and getting buttocks shifted around due to wanting to sit prematurely)   Toileting- Clothing Manipulation and Hygiene: Total assistance (+3 2 to stand and one to clean')                          Cognition   Behavior During Therapy: Anxious;Impulsive Overall Cognitive Status: Impaired/Different from baseline Area of Impairment: Orientation;Attention;Following commands;Safety/judgement;Awareness;Problem solving Orientation Level: Disoriented to;Place;Time (oriented her twice at beginning of session, end of session she was able to tell us year and where she was) Current Attention Level: Sustained    Following Commands: Follows one step commands inconsistently;Follows one step commands with increased time Safety/Judgement: Decreased awareness of safety;Decreased awareness of deficits Awareness: Intellectual Problem Solving: Slow processing;Decreased initiation;Difficulty sequencing;Requires verbal cues;Requires tactile cues                   Pertinent Vitals/ Pain       Pain Assessment: Faces Faces Pain Scale: Hurts little more Pain Location: left arm where IV is Pain Descriptors / Indicators: Sore Pain Intervention(s): Repositioned;Monitored during session (RN made aware)  Frequency Min 3X/week     Progress Toward Goals  OT Goals(current goals can now be found in the care plan section)  Progress towards OT goals: Progressing toward goals     Plan Discharge plan remains appropriate    Co-evaluation      Reason for Co-Treatment:  Complexity of the patient's impairments (multi-system involvement);For patient/therapist safety   OT goals addressed during session: ADL's and self-care;Strengthening/ROM      End of Session Equipment Utilized During Treatment: Gait belt;Rolling walker   Activity Tolerance  (Pt limited by cognition and decreased safety)   Patient Left in chair;with call bell/phone within reach;with chair alarm set (lift pad under her)   Nurse Communication Mobility status (maxi move pad under her)        Time: 6010-9323 OT Time Calculation (min): 38 min  Charges: OT General Charges $OT Visit: 1 Procedure OT Treatments $Self Care/Home Management : 23-37 mins  Evette Georges [phone] 07/03/2015, 1:10 PM

## 2015-07-03 NOTE — Progress Notes (Signed)
Patient ID: Valerie Wagner, female   DOB: February 26, 1951, 65 y.o.   MRN: [id number]         [facility]    Date of Admission:  06/25/2015   Total days of antibiotics 8         Principal Problem:   Acute encephalopathy Active Problems:   Sepsis (HCC)   Fever   Essential hypertension, benign   Severe sepsis (HCC)   Somnolence   Chronic pain   Type 2 diabetes mellitus (HCC)   Polyneuropathy (HCC)   Severe obesity (BMI >= 40) (HCC)   Type 2 diabetes mellitus without complication, without long-term current use of insulin (HCC)   Meningoencephalitis   Hypoxemia   . antiseptic oral rinse  7 mL Mouth Rinse BID  . cefTRIAXone (ROCEPHIN)  IV  2 g Intravenous Q12H  . enoxaparin (LOVENOX) injection  40 mg Subcutaneous Daily  . insulin aspart  0-15 Units Subcutaneous TID WC  . insulin glargine  25 Units Subcutaneous QHS  . levETIRAcetam  500 mg Oral BID  . sodium chloride  3 mL Intravenous Q12H  . vancomycin  1,000 mg Intravenous Q8H    SUBJECTIVE: She says she is feeling better. She denies headache. She now knows that she is at [facility]  Review of Systems: Review of Systems  Unable to perform ROS: mental acuity    Past Medical History  Diagnosis Date  . Chest pain     Reassuring workup of her time including negative cardiac catheterization and stress testing  . Mixed hyperlipidemia   . Obesity   . Essential hypertension, benign   . COPD (chronic obstructive pulmonary disease) (HCC)   . DJD (degenerative joint disease)   . Urinary incontinence   . GERD (gastroesophageal reflux disease)   . IBS (irritable bowel syndrome)   . Fibromyalgia   . Asthma   . Neuropathy (HCC)   . Shortness of breath dyspnea   . Type 2 diabetes mellitus (HCC)   . Depression   . Anxiety   . DDD (degenerative disc disease), lumbar     Social History  Substance Use Topics  . Smoking status: Former Smoker -- 1.00 packs/day for 2 years    Types: Cigarettes    Quit  date: 06/20/1982  . Smokeless tobacco: Never Used  . Alcohol Use: No    Family History  Problem Relation Age of Onset  . Coronary artery disease Father   . Heart attack Father     Died age 63   Allergies  Allergen Reactions  . Nsaids Shortness Of Breath  . Bee Venom   . Chocolate   . Darvon [Propoxyphene]   . Ivp Dye [Iodinated Diagnostic Agents] Swelling  . Doxycycline Rash  . Penicillins Rash    OBJECTIVE: Filed Vitals:   07/03/15 0345 07/03/15 0800 07/03/15 1200 07/03/15 1243  BP: 122/74 134/67 128/67   Pulse: 54 58 59   Temp: 98.4 F (36.9 C) 98.2 F (36.8 C) 97.5 F (36.4 C)   TempSrc: Oral Oral Oral   Resp: 18 16 17    Height:      Weight:      SpO2: 100% 100% 99% 100%   Body mass index is 45.89 kg/(m^2).  Physical Exam  Constitutional:  She is alert and in no distress resting in bed.  Eyes: Conjunctivae are normal.  Neck: Neck supple.  Cardiovascular: Normal rate and regular rhythm.   No murmur heard. Pulmonary/Chest: Breath sounds normal.  Abdominal: Soft.  There is no tenderness.  Neurological: She is alert.  Skin: No rash noted.    Lab Results Lab Results  Component Value Date   WBC 13.6* 07/01/2015   HGB 13.0 07/01/2015   HCT 39.2 07/01/2015   MCV 91.8 07/01/2015   PLT 309 07/01/2015    Lab Results  Component Value Date   CREATININE 0.64 07/03/2015   BUN 7 07/03/2015   NA 142 07/03/2015   K 3.3* 07/03/2015   CL 105 07/03/2015   CO2 27 07/03/2015    Lab Results  Component Value Date   ALT 55* 07/01/2015   AST 42* 07/01/2015   ALKPHOS 58 07/01/2015   BILITOT 0.8 07/01/2015     Microbiology: Recent Results (from the past 240 hour(s))  Blood Culture (routine x 2)     Status: None   Collection Time: 06/25/15 11:05 AM  Result Value Ref Range Status   Specimen Description BLOOD LEFT ANTECUBITAL DRAWN BY RN  Final   Special Requests BOTTLES DRAWN AEROBIC AND ANAEROBIC 6CC EACH  Final   Culture NO GROWTH 8 DAYS  Final   Report  Status 07/03/2015 FINAL  Final  Urine culture     Status: None   Collection Time: 06/25/15 11:06 AM  Result Value Ref Range Status   Specimen Description URINE, CATHETERIZED  Final   Special Requests NONE  Final   Culture   Final    NO GROWTH 1 DAY Performed at [facility]    Report Status 06/26/2015 FINAL  Final  Blood Culture (routine x 2)     Status: None   Collection Time: 06/25/15 12:21 PM  Result Value Ref Range Status   Specimen Description BLOOD LEFT HAND  Final   Special Requests BOTTLES DRAWN AEROBIC ONLY 4CC  Final   Culture NO GROWTH 8 DAYS  Final   Report Status 07/03/2015 FINAL  Final  MRSA PCR Screening     Status: None   Collection Time: 06/25/15  9:08 PM  Result Value Ref Range Status   MRSA by PCR NEGATIVE NEGATIVE Final    Comment:        The GeneXpert MRSA Assay (FDA approved for NASAL specimens only), is one component of a comprehensive MRSA colonization surveillance program. It is not intended to diagnose MRSA infection nor to guide or monitor treatment for MRSA infections.   Culture, blood (x 2)     Status: None   Collection Time: 06/25/15 11:06 PM  Result Value Ref Range Status   Specimen Description LEFT ANTECUBITAL  Final   Special Requests BOTTLES DRAWN AEROBIC ONLY 4CC  Final   Culture NO GROWTH 5 DAYS  Final   Report Status 06/30/2015 FINAL  Final  Culture, blood (x 2)     Status: None   Collection Time: 06/25/15 11:16 PM  Result Value Ref Range Status   Specimen Description RIGHT ANTECUBITAL  Final   Special Requests IN PEDIATRIC BOTTLE 3CC  Final   Culture NO GROWTH 5 DAYS  Final   Report Status 06/30/2015 FINAL  Final  CSF culture     Status: None   Collection Time: 06/30/15 12:25 PM  Result Value Ref Range Status   Specimen Description CSF  Final   Special Requests NONE  Final   Gram Stain   Final    CYTOSPIN SMEAR WBC PRESENT, PREDOMINANTLY MONONUCLEAR NO ORGANISMS SEEN    Culture NO GROWTH 3 DAYS  Final   Report  Status 07/03/2015 FINAL  Final  ASSESSMENT: She seems to be making gradual process each day. Her CSF cultures are negative and her CSF HSV PCR was negative as well. I will continue empiric vancomycin and ceftriaxone for 2 more days and stop. I expect her to have a prolonged recovery. I have ordered are below viral testing on her remaining CSF. Those results will not be back for several weeks and will not impact her treatment.  PLAN: 1. Continue vancomycin and ceftriaxone for 2 more days. I have entered stop orders. 2. I will sign off now  Cliffton Asters, MD [facility] [phone] pager   [phone] cell 07/03/2015, 2:43 PM

## 2015-07-03 NOTE — Progress Notes (Signed)
Physical Therapy Treatment Patient Details Name: Valerie Wagner MRN: [id number] DOB: 01-18-1951 Today's Date: 07/03/2015    History of Present Illness [age] yo F with fever, leukocytosis, delirium. She has now had multiple days of acyclovir, vancomycin, ceftriaxone with continued encephalopathy. With fevers and confusion, I do think that an infectious etiology is most likely. We could try her LP again, but am concerned that since it has not been able to be accomplished even with ativan previously, it may be difficult to do without more significant sedation. Past medical history of type 2 diabetes mellitus, essential hypertension, chronic back pain and obesity.     PT Comments    Pt admitted with above diagnosis. Pt currently with functional limitations due to balance and endurance deficits as well as signficant cognitive deficits affecting function. Pt moved a lot better to command today and was more awake.  Pt still needing 3 person assist due to body habitus and cognitive issues limiting ability to focus on tasks making safety an issue.  Pt will benefit from skilled PT to increase their independence and safety with mobility to allow discharge to the venue listed below.    Follow Up Recommendations  CIR;Supervision/Assistance - 24 hour     Equipment Recommendations  Other (comment) (TBA)    Recommendations for Other Services       Precautions / Restrictions Precautions Precautions: Fall Restrictions Weight Bearing Restrictions: No    Mobility  Bed Mobility Overal bed mobility: Needs Assistance Bed Mobility: Supine to Sit;Rolling Rolling: Supervision   Supine to sit: Mod assist (with increased time)     General bed mobility comments: Pt was soaked with urine.  Washed pts hair and had pt wash her upper body.    Transfers Overall transfer level: Needs assistance Equipment used: Rolling walker (2 wheeled)   Sit to Stand: Max assist (+3 (block left knee)) Stand pivot transfers: Max  assist (+3 block left knee and help get hips shifted around )       General transfer comment:  A pt by washing lower body and perineal area when she stood.  ATtempted to use RW but pt with left LE hyperextended and poor proprioception noted as well therefore moved RW and performed a 3 person stand pivot transfer blocking left knee and 3rd person assisting by swinging hips/buttocks into chair.   Ambulation/Gait                 Stairs            Wheelchair Mobility    Modified Rankin (Stroke Patients Only) Modified Rankin (Stroke Patients Only) Pre-Morbid Rankin Score: Moderate disability Modified Rankin: Severe disability     Balance Overall balance assessment: Needs assistance Sitting-balance support: No upper extremity supported;Feet supported Sitting balance-Leahy Scale: Fair Sitting balance - Comments: Pt sat EOB for 10 minutes working on UBB and grooming with min guard assist.    Standing balance support: Bilateral upper extremity supported;During functional activity Standing balance-Leahy Scale: Zero Standing balance comment: +3 assist for sit to stand                    Cognition Arousal/Alertness: Awake/alert Behavior During Therapy: Anxious;Impulsive Overall Cognitive Status: Impaired/Different from baseline Area of Impairment: Orientation;Attention;Following commands;Safety/judgement;Awareness;Problem solving Orientation Level: Disoriented to;Place;Time (oriented her twice at beginning of session, end of session she was able to tell us year and where she was) Current Attention Level: Sustained   Following Commands: Follows one step commands inconsistently;Follows one step commands with increased  time Safety/Judgement: Decreased awareness of safety;Decreased awareness of deficits Awareness: Intellectual Problem Solving: Slow processing;Decreased initiation;Difficulty sequencing;Requires verbal cues;Requires tactile cues General Comments: Following  commands better today with more consistency.  Still disoriented and perseverating at times.     Exercises General Exercises - Lower Extremity Long Arc Quad: AROM;Both;5 reps;Seated    General Comments        Pertinent Vitals/Pain Pain Assessment: Faces Faces Pain Scale: Hurts little more Pain Location: left UE Pain Descriptors / Indicators: Sore Pain Intervention(s): Limited activity within patient's tolerance;Monitored during session;Repositioned  VSS    Home Living                      Prior Function            PT Goals (current goals can now be found in the care plan section) Progress towards PT goals: Progressing toward goals    Frequency  Min 3X/week    PT Plan Current plan remains appropriate    Co-evaluation PT/OT/SLP Co-Evaluation/Treatment: Yes Reason for Co-Treatment: Complexity of the patient's impairments (multi-system involvement) PT goals addressed during session: Mobility/safety with mobility       End of Session Equipment Utilized During Treatment: Gait belt Activity Tolerance: Patient limited by fatigue Patient left: in chair;with call bell/phone within reach;with chair alarm set;with restraints reapplied     Time: 1610-9604 PT Time Calculation (min) (ACUTE ONLY): 38 min  Charges:  $Therapeutic Activity: 8-22 mins                    G CodesTawni Millers F 07/25/15, 4:54 PM [employer] [facility] [phone] (pager)

## 2015-07-03 NOTE — Progress Notes (Signed)
[facility] Progress Note   Valerie Wagner  ZOX:[id number]  DOB: [birth year]  DOA: 06/25/2015 PCP: Donzetta Sprung, MD  Brief narrative: Valerie Wagner is a 65 y.o. female with past medical history of diabetes mellitus type 2, hypertension and obesity who was brought to [facility] for altered mental status. Her husband reported that he had found her on the floor unresponsive. In route to the hospital she was combative and was given a milligram of Versed by EMS. She was found to have severe sepsis with a temperature of 102.6, WBC count of 19.3 and a lactic acid of 3.8. Initial blood pressure was 60/40. She was started on broad-spectrum antibiotics.   Subjective: Remains confused- no complaints.   Assessment/Plan: Principal Problem:   Acute encephalopathy -Suspected to be secondary to sepsis and encephalitis remains quite confused.  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   Antibiotics: Anti-infectives    Start     Dose/Rate Route Frequency Ordered Stop   07/03/15 1900  vancomycin (VANCOCIN) IVPB 1000 mg/200 mL premix  Status:  Discontinued     1,000 mg 200 mL/hr over 60 Minutes Intravenous Every 8 hours 07/02/15 1712 07/03/15 0801   07/03/15 1900  cefTRIAXone (ROCEPHIN) 2 g in dextrose 5 % 50 mL IVPB  Status:  Discontinued     2 g 100 mL/hr over 30 Minutes Intravenous Every 12 hours 07/02/15 1729 07/03/15 0758    07/03/15 0830  vancomycin (VANCOCIN) IVPB 1000 mg/200 mL premix     1,000 mg 200 mL/hr over 60 Minutes Intravenous Every 8 hours 07/03/15 0801 07/06/15 0829   07/03/15 0800  cefTRIAXone (ROCEPHIN) 2 g in dextrose 5 % 50 mL IVPB     2 g 100 mL/hr over 30 Minutes Intravenous Every 12 hours 07/03/15 0758 07/06/15 0801   06/29/15 2200  vancomycin (VANCOCIN) IVPB 1000 mg/200 mL premix  Status:  Discontinued     1,000 mg 200 mL/hr over 60 Minutes Intravenous Every 8 hours 06/29/15 1440 07/02/15 1712   06/26/15 1730  acyclovir (ZOVIRAX) 600 mg in dextrose 5 % 100 mL IVPB  Status:  Discontinued     600 mg 112 mL/hr over 60 Minutes Intravenous 3 times per day 06/26/15 1717 07/02/15 1031   06/26/15 1600  ampicillin (OMNIPEN) 2 g in sodium chloride 0.9 % 50 mL IVPB  Status:  Discontinued     2 g 150 mL/hr over 20 Minutes Intravenous 6 times per day 06/26/15 1529 06/26/15 1658   06/26/15 1530  vancomycin (VANCOCIN) IVPB 1000 mg/200 mL premix  Status:  Discontinued     1,000 mg 200 mL/hr over 60 Minutes Intravenous  Once 06/26/15 1529 06/26/15 1533   06/26/15 1530  cefTRIAXone (ROCEPHIN) 2 g in dextrose 5 % 50 mL IVPB  Status:  Discontinued     2 g 100 mL/hr over 30 Minutes Intravenous Every 12 hours 06/26/15 1529 07/02/15 1729   06/26/15 1000  levofloxacin (LEVAQUIN) IVPB 750 mg  Status:  Discontinued     750 mg 100 mL/hr over 90 Minutes Intravenous Every 24 hours 06/25/15 2052 06/26/15 1530   06/26/15 0200  vancomycin (VANCOCIN) IVPB 1000 mg/200 mL premix  Status:  Discontinued     1,000 mg 200 mL/hr over 60 Minutes Intravenous Every 12 hours 06/25/15 2052 06/29/15 1439   06/25/15 2200  aztreonam (AZACTAM) 2 g in dextrose 5 % 50 mL IVPB  Status:  Discontinued     2 g 100 mL/hr over 30 Minutes Intravenous 3 times per day 06/25/15 2052 06/26/15 1530   06/25/15 2100  aztreonam (AZACTAM) 2 g in dextrose 5 % 50 mL IVPB  Status:  Discontinued     2 g 100 mL/hr over 30 Minutes Intravenous  Once  06/25/15 1625 06/25/15 2052   06/25/15 1430  acyclovir (ZOVIRAX) 600 mg in dextrose 5 % 100 mL IVPB     600 mg 112 mL/hr over 60 Minutes Intravenous  Once 06/25/15 1405 06/25/15 1601   06/25/15 1315  vancomycin (VANCOCIN) IVPB 1000 mg/200 mL premix    Comments:  ** Give 1000mg  IV x 2 bags total back to back to = total dose of 2000mg  **   1,000 mg 200 mL/hr over 60 Minutes Intravenous  Once 06/25/15 1115 06/25/15 1459   06/25/15 1215  vancomycin (VANCOCIN) IVPB 1000 mg/200 mL premix    Comments:  ** Give 1000mg  IV x 2 bags total back to back to = total dose of 2000mg  **   1,000 mg 200 mL/hr over 60 Minutes Intravenous  Once 06/25/15 1115 06/25/15 1403   06/25/15 1115  levofloxacin (LEVAQUIN) IVPB 750 mg     750 mg 100 mL/hr over 90 Minutes Intravenous  Once 06/25/15 1106 06/25/15 1336   06/25/15 1115  aztreonam (AZACTAM) 2 g in dextrose 5 % 50 mL IVPB     2 g 100 mL/hr over 30 Minutes Intravenous  Once 06/25/15 1106 06/25/15 1245   06/25/15 1115  vancomycin (VANCOCIN) IVPB 1000 mg/200 mL premix  Status:  Discontinued     1,000 mg 200 mL/hr over 60 Minutes Intravenous  Once 06/25/15 1106 06/25/15 1110     Code Status:     Code Status Orders        Start     Ordered   06/25/15 2147  Full code   Continuous     06/25/15 2146    Code Status History    Date Active Date Inactive Code Status Order ID Comments User Context   This patient has a current code status but no historical code status.    Advance Directive Documentation        Most Recent Value   Type of Advance Directive  Healthcare Power of Attorney [son josh]   Pre-existing out of facility DNR order (yellow form or pink MOST form)     "MOST" Form in Place?       Family Communication:  Disposition Plan: tx to med surg- being evaluated for CIR DVT prophylaxis: Lovenox Consultants: Neurology, ID Procedures: LP    Objective: Filed Weights   06/30/15 0330 07/01/15 0405 07/02/15 0456  Weight: 123.7 kg (272 lb 11.3  oz) 122.9 kg (270 lb 15.1 oz) 121.337 kg (267 lb 8 oz)    Intake/Output Summary (Last 24 hours) at 07/03/15 1049 Last data filed at 07/02/15 2104  Gross per 24 hour  Intake    100 ml  Output      0 ml  Net  100 ml     Vitals Filed Vitals:   07/03/15 0010 07/03/15 0200 07/03/15 0345 07/03/15 0800  BP: 154/70 113/60 122/74 134/67  Pulse:  58 54 58  Temp: 98.7 F (37.1 C)  98.4 F (36.9 C) 98.2 F (36.8 C)  TempSrc: Tympanic  Oral Oral  Resp: 22 18 18 16   Height:      Weight:      SpO2: 100% 100% 100% 100%    Exam:  General:  Pt is alert, confused to time and place, not in acute distress  HEENT: No icterus, No thrush, oral mucosa moist  Cardiovascular: regular rate and rhythm, S1/S2 No murmur  Respiratory: clear to auscultation bilaterally   Abdomen: Soft, +Bowel sounds, non tender, non distended, no guarding  MSK: No cyanosis or clubbing- no pedal edema   Data Reviewed: Basic Metabolic Panel:  Recent Labs Lab 06/28/15 1623 06/29/15 0327 06/30/15 0300 06/30/15 1001 07/01/15 0748 07/03/15 0300  NA 141 141 142 142 141 142  K 4.7 3.5 2.7* 3.2* 3.1* 3.3*  CL 103 104 102  --  102 105  CO2 24 27 29   --  29 27  GLUCOSE 216* 148* 157* 162* 180* 114*  BUN 8 8 6   --  6 7  CREATININE 0.80 0.61 0.64  --  0.67 0.64  CALCIUM 9.6 9.3 9.5  --  9.3 10.0  MG 2.0  --   --   --   --   --    Liver Function Tests:  Recent Labs Lab 06/28/15 0402 07/01/15 0748  AST 45* 42*  ALT 36 55*  ALKPHOS 51 58  BILITOT 0.6 0.8  PROT 6.2* 6.4*  ALBUMIN 3.0* 3.0*   No results for input(s): LIPASE, AMYLASE in the last 168 hours.  Recent Labs Lab 06/26/15 1355  AMMONIA 44*   CBC:  Recent Labs Lab 06/26/15 1355 06/27/15 0340 06/28/15 0402 06/29/15 0327 06/30/15 0300 06/30/15 1001 07/01/15 0748  WBC 13.8* 15.7* 13.0* 13.1* 12.5*  --  13.6*  NEUTROABS 11.3*  --  8.9*  --   --   --  10.0*  HGB 12.0 13.4 12.9 14.0 13.1 13.9 13.0  HCT 36.2 39.9 37.0 40.1 39.7 41.0  39.2  MCV 89.8 90.3 89.4 88.7 91.3  --  91.8  PLT 229 220 242 241 282  --  309   Cardiac Enzymes: No results for input(s): CKTOTAL, CKMB, CKMBINDEX, TROPONINI in the last 168 hours. BNP (last 3 results)  Recent Labs  06/26/15 1355  BNP 362.7*    ProBNP (last 3 results) No results for input(s): PROBNP in the last 8760 hours.  CBG:  Recent Labs Lab 07/02/15 1603 07/02/15 2124 07/02/15 2349 07/03/15 0503 07/03/15 0832  GLUCAP 206* 139* 136* 99 108*    Recent Results (from the past 240 hour(s))  Blood Culture (routine x 2)     Status: None   Collection Time: 06/25/15 11:05 AM  Result Value Ref Range Status   Specimen Description BLOOD LEFT ANTECUBITAL DRAWN BY RN  Final   Special Requests BOTTLES DRAWN AEROBIC AND ANAEROBIC [facility] EACH  Final   Culture NO GROWTH 8 DAYS  Final   Report Status 07/03/2015 FINAL  Final  Urine culture     Status: None   Collection Time: 06/25/15 11:06 AM  Result Value Ref Range Status   Specimen Description URINE, CATHETERIZED  Final   Special Requests NONE  Final   Culture   Final    NO GROWTH 1  DAY Performed at [facility]    Report Status 06/26/2015 FINAL  Final  Blood Culture (routine x 2)     Status: None   Collection Time: 06/25/15 12:21 PM  Result Value Ref Range Status   Specimen Description BLOOD LEFT HAND  Final   Special Requests BOTTLES DRAWN AEROBIC ONLY 4CC  Final   Culture NO GROWTH 8 DAYS  Final   Report Status 07/03/2015 FINAL  Final  MRSA PCR Screening     Status: None   Collection Time: 06/25/15  9:08 PM  Result Value Ref Range Status   MRSA by PCR NEGATIVE NEGATIVE Final    Comment:        The GeneXpert MRSA Assay (FDA approved for NASAL specimens only), is one component of a comprehensive MRSA colonization surveillance program. It is not intended to diagnose MRSA infection nor to guide or monitor treatment for MRSA infections.   Culture, blood (x 2)     Status: None   Collection Time: 06/25/15  11:06 PM  Result Value Ref Range Status   Specimen Description LEFT ANTECUBITAL  Final   Special Requests BOTTLES DRAWN AEROBIC ONLY 4CC  Final   Culture NO GROWTH 5 DAYS  Final   Report Status 06/30/2015 FINAL  Final  Culture, blood (x 2)     Status: None   Collection Time: 06/25/15 11:16 PM  Result Value Ref Range Status   Specimen Description RIGHT ANTECUBITAL  Final   Special Requests IN PEDIATRIC BOTTLE 3CC  Final   Culture NO GROWTH 5 DAYS  Final   Report Status 06/30/2015 FINAL  Final  CSF culture     Status: None   Collection Time: 06/30/15 12:25 PM  Result Value Ref Range Status   Specimen Description CSF  Final   Special Requests NONE  Final   Gram Stain   Final    CYTOSPIN SMEAR WBC PRESENT, PREDOMINANTLY MONONUCLEAR NO ORGANISMS SEEN    Culture NO GROWTH 3 DAYS  Final   Report Status 07/03/2015 FINAL  Final     Studies: Dg Swallowing Func-speech Pathology  07/01/2015  Objective Swallowing Evaluation:   MBS Patient Details Name: Brandace Cargle MRN: [id number] Date of Birth: Apr 21, 1951 Today's Date: 07/01/2015 Time: SLP Start Time (ACUTE ONLY): 1200-SLP Stop Time (ACUTE ONLY): 1220 SLP Time Calculation (min) (ACUTE ONLY): 20 min Past Medical History: @PMH @ Past Surgical History: Past Surgical History Procedure Laterality Date . Tonsillectomy   . Hysterectomy -- unknown   . Cholecystectomy   . Carpal tunnel release     Right . Left thumb surgery   . Cesarean section   HPI: 65 y.o. female with past medical history of type 2 diabetes mellitus, essential hypertension, GERD, COPD, fibromyalgia, chronic back pain and obesity admitted for altered mental status, fever and unresponsiveness. Found to be septic, neurologist suspects infectious process, toxic metabolic encephalopathy. CXR lungs hypoexpanded. Vascular congestion and cardiomegaly, with mildly increased interstitial markings, concerning for mild interstitial edema. No Data Recorded Assessment / Plan / Recommendation CHL IP  CLINICAL IMPRESSIONS 07/01/2015 Therapy Diagnosis Mild oral phase dysphagia;Moderate oral phase dysphagia;Mild pharyngeal phase dysphagia Clinical Impression Mild-moderate oral dysphagia marked by decreased manipulation and delayed transit. Mild-moderate sensorimotor based pharyngeal dysphagia with decreased sensation (mild), reduced tongue base retraction  and decreased laryngeal elevation leading to delayed swallow initiation and mild-moderate vallecular and pyriform sinsus residue. Silent (mild) penetration following nectar trial after the initial swallow. No penetration/aspiration with thin barium, however risk is increased due to delayed initiation  and an unusual increase in pharyngeal residue with thin compared to nectar. Recommend Dys 2 texture, nectar thick liquds, crush meds, no straws, two swallows and full supervision.  Impact on safety and function Moderate aspiration risk   CHL IP TREATMENT RECOMMENDATION 07/01/2015 Treatment Recommendations Therapy as outlined in treatment plan below   Prognosis 07/01/2015 Prognosis for Safe Diet Advancement Good Barriers to Reach Goals Cognitive deficits Barriers/Prognosis Comment -- CHL IP DIET RECOMMENDATION 07/01/2015 SLP Diet Recommendations Dysphagia 2 (Fine chop) solids;Nectar thick liquid Liquid Administration via Cup;No straw Medication Administration Crushed with puree Compensations Minimize environmental distractions;Slow rate;Small sips/bites;Multiple dry swallows after each bite/sip Postural Changes Remain semi-upright after after feeds/meals (Comment)   CHL IP OTHER RECOMMENDATIONS 07/01/2015 Recommended Consults -- Oral Care Recommendations Oral care BID Other Recommendations --   CHL IP FOLLOW UP RECOMMENDATIONS 07/01/2015 Follow up Recommendations (No Data)   CHL IP FREQUENCY AND DURATION 07/01/2015 Speech Therapy Frequency (ACUTE ONLY) min 2x/week Treatment Duration 2 weeks      CHL IP ORAL PHASE 07/01/2015 Oral Phase Impaired Oral - Pudding Teaspoon -- Oral  - Pudding Cup -- Oral - Honey Teaspoon -- Oral - Honey Cup -- Oral - Nectar Teaspoon Delayed oral transit;Weak lingual manipulation Oral - Nectar Cup Weak lingual manipulation;Delayed oral transit Oral - Nectar Straw -- Oral - Thin Teaspoon Delayed oral transit Oral - Thin Cup Delayed oral transit Oral - Thin Straw -- Oral - Puree -- Oral - Mech Soft -- Oral - Regular Delayed oral transit;Weak lingual manipulation;Impaired mastication Oral - Multi-Consistency -- Oral - Pill -- Oral Phase - Comment --  CHL IP PHARYNGEAL PHASE 07/01/2015 Pharyngeal Phase Impaired Pharyngeal- Pudding Teaspoon -- Pharyngeal -- Pharyngeal- Pudding Cup -- Pharyngeal -- Pharyngeal- Honey Teaspoon -- Pharyngeal -- Pharyngeal- Honey Cup -- Pharyngeal -- Pharyngeal- Nectar Teaspoon Delayed swallow initiation-pyriform sinuses;Penetration/Apiration after swallow;Pharyngeal residue - pyriform;Pharyngeal residue - valleculae;Reduced laryngeal elevation;Reduced tongue base retraction Pharyngeal Material enters airway, remains ABOVE vocal cords and not ejected out Pharyngeal- Nectar Cup Pharyngeal residue - valleculae;Pharyngeal residue - pyriform;Reduced tongue base retraction;Reduced laryngeal elevation Pharyngeal -- Pharyngeal- Nectar Straw -- Pharyngeal -- Pharyngeal- Thin Teaspoon Pharyngeal residue - pyriform;Pharyngeal residue - valleculae;Reduced tongue base retraction;Reduced laryngeal elevation Pharyngeal -- Pharyngeal- Thin Cup Pharyngeal residue - valleculae;Pharyngeal residue - pyriform;Reduced tongue base retraction;Reduced laryngeal elevation;Delayed swallow initiation-pyriform sinuses Pharyngeal -- Pharyngeal- Thin Straw -- Pharyngeal -- Pharyngeal- Puree -- Pharyngeal -- Pharyngeal- Mechanical Soft -- Pharyngeal -- Pharyngeal- Regular Pharyngeal residue - valleculae;Reduced tongue base retraction Pharyngeal -- Pharyngeal- Multi-consistency -- Pharyngeal -- Pharyngeal- Pill -- Pharyngeal -- Pharyngeal Comment --  CHL IP CERVICAL  ESOPHAGEAL PHASE 07/01/2015 Cervical Esophageal Phase Impaired Pudding Teaspoon -- Pudding Cup -- Honey Teaspoon -- Honey Cup -- Nectar Teaspoon -- Nectar Cup -- Nectar Straw -- Thin Teaspoon -- Thin Cup -- Thin Straw -- Puree -- Mechanical Soft -- Regular -- Multi-consistency -- Pill -- Cervical Esophageal Comment -- Royce MacadamiaLitaker, Lisa Willis 07/01/2015, 4:08 PM Breck CoonsLisa Willis Litaker M.Ed [employer] Pager [phone]               Scheduled Meds:  Scheduled Meds: . antiseptic oral rinse  7 mL Mouth Rinse BID  . cefTRIAXone (ROCEPHIN)  IV  2 g Intravenous Q12H  . enoxaparin (LOVENOX) injection  40 mg Subcutaneous Daily  . insulin aspart  0-15 Units Subcutaneous Q4H  . insulin glargine  25 Units Subcutaneous QHS  . levETIRAcetam  500 mg Oral BID  . sodium chloride  3 mL Intravenous Q12H  . vancomycin  1,000  mg Intravenous Q8H   Continuous Infusions:    Time spent on care of this patient: 35 min   Anddy Wingert, MD 07/03/2015, 10:49 AM  LOS: 8 days   [facility] Office  [phone] Pager - Text Page per [email or web address] If 7PM-7AM, please contact night-coverage [email or web address]

## 2015-07-03 NOTE — Progress Notes (Signed)
Subjective: More awake and alert, but still confused.   Exam: Filed Vitals:   07/03/15 0200 07/03/15 0345  BP: 113/60 122/74  Pulse: 58 54  Temp:  98.4 F (36.9 C)  Resp: 18 18   Gen: In bed, NAD Resp: non-labored breathing, no acute distress Abd: soft, nt  Neuro: MS: Awake, alert, gives [place] as location,  Year as "Two thousand..... Potatoes" [id number]  Motor: Moves all extremities. Gives better effort today Sensory:endorses sensation bilaterally.   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 She has now had multiple days of acyclovir, vancomycin, ceftriaxone with continued encephalopathy. With fevers and confusion, I do think that an infectious etiology is most likely. With HSV negative, acyclovir has been stopped.   With her fevers and abrupt onset and sepsis like picture at presentation, I do think that infectious etiology is to blame. She may have a slow recovery with continued confusion for some time, but given how interactive she is would expect continued improvement.   Recommendations: 1) continue keppra 500mg  BID 2) will continue to follow.   Ritta SlotMcNeill Juris Gosnell, MD [facility] [phone]  If 7pm- 7am, please page neurology on call as listed in AMION.

## 2015-07-04 DIAGNOSIS — G8929 Other chronic pain: Secondary | ICD-10-CM

## 2015-07-04 LAB — BASIC METABOLIC PANEL
ANION GAP: 8 (ref 5–15)
BUN: 8 mg/dL (ref 6–20)
CALCIUM: 10.1 mg/dL (ref 8.9–10.3)
CO2: 27 mmol/L (ref 22–32)
Chloride: 107 mmol/L (ref 101–111)
Creatinine, Ser: 0.75 mg/dL (ref 0.44–1.00)
Glucose, Bld: 193 mg/dL — ABNORMAL HIGH (ref 65–99)
POTASSIUM: 3.5 mmol/L (ref 3.5–5.1)
Sodium: 142 mmol/L (ref 135–145)

## 2015-07-04 LAB — GLUCOSE, CAPILLARY
GLUCOSE-CAPILLARY: 168 mg/dL — AB (ref 65–99)
GLUCOSE-CAPILLARY: 178 mg/dL — AB (ref 65–99)
GLUCOSE-CAPILLARY: 115 mg/dL — AB (ref 65–99)

## 2015-07-04 MED ORDER — HYDROCHLOROTHIAZIDE 12.5 MG PO CAPS
12.5000 mg | ORAL_CAPSULE | Freq: Every day | ORAL | Status: DC
  Administered 2015-07-04 – 2015-07-07 (×4): 12.5 mg via ORAL
  Filled 2015-07-04 (×5): qty 1

## 2015-07-04 MED ORDER — INSULIN GLARGINE 100 UNIT/ML ~~LOC~~ SOLN
30.0000 [IU] | Freq: Every day | SUBCUTANEOUS | Status: DC
  Administered 2015-07-04 – 2015-07-05 (×2): 30 [IU] via SUBCUTANEOUS
  Filled 2015-07-04 (×4): qty 0.3

## 2015-07-04 MED ORDER — BENAZEPRIL HCL 20 MG PO TABS
20.0000 mg | ORAL_TABLET | Freq: Every day | ORAL | Status: DC
  Administered 2015-07-05 – 2015-07-07 (×3): 20 mg via ORAL
  Filled 2015-07-04 (×4): qty 1

## 2015-07-04 MED ORDER — BENAZEPRIL-HYDROCHLOROTHIAZIDE 20-12.5 MG PO TABS: 1.0000 | ORAL_TABLET | Freq: Every day | ORAL | Status: DC

## 2015-07-04 NOTE — Progress Notes (Addendum)
Subjective: Continues to make slow progress.   Exam: Filed Vitals:   07/03/15 2134 07/04/15 0503  BP: 123/95 128/63  Pulse: 83 62  Temp: 97.3 F (36.3 C) 98.5 F (36.9 C)  Resp: 18    Gen: In bed, NAD Resp: non-labored breathing, no acute distress Abd: soft, nt  Neuro: MS: Awake, alert, gives [place] as location Motor: Moves all extremities. No drift. Sensory:endorses sensation bilaterally.   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   With her fevers and abrupt onset and sepsis like picture at presentation, I do think that infectious etiology is to blame. She may have a slow recovery with continued confusion for some time, but given how interactive she is would expect continued improvement.   I suspect that her improvement will continue, but be slow, likely will need prolonged rehabilitation.   Recommendations: 1) continue keppra 500mg  BID 2) Please call with any further questions or concerns.   Ritta SlotMcNeill Kirkpatrick, MD [facility] [phone]  If 7pm- 7am, please page neurology on call as listed in AMION.

## 2015-07-04 NOTE — Progress Notes (Signed)
[facility] Progress Note   Valerie Wagner  [id number]  DOB: 1951-03-25  DOA: 06/25/2015 PCP: [name], [name], MD  Brief narrative: Valerie Wagner is a 65 y.o. female with past medical history of diabetes mellitus type 2, hypertension and obesity who was brought to [facility] for altered mental status. Her husband reported that he had found her on the floor unresponsive. In route to the hospital she was combative and was given a milligram of Versed by EMS. She was found to have severe sepsis with a temperature of 102.6, WBC count of 19.3 and a lactic acid of 3.8. Initial blood pressure was 60/40. She was started on broad-spectrum antibiotics.   Subjective: Confusion unchanged- no complaints.   Assessment/Plan: Principal Problem:   Acute encephalopathy -Suspected to be secondary to sepsis and encephalitis remains quite confused.  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   Antibiotics: Anti-infectives    Start     Dose/Rate Route Frequency Ordered Stop   07/03/15 1900  vancomycin (VANCOCIN) IVPB 1000 mg/200 mL premix  Status:  Discontinued     1,000 mg 200 mL/hr over 60 Minutes Intravenous Every 8 hours 07/02/15 1712 07/03/15 0801   07/03/15 1900  cefTRIAXone (ROCEPHIN) 2 g in dextrose 5 % 50 mL IVPB  Status:  Discontinued     2 g 100 mL/hr over 30  Minutes Intravenous Every 12 hours 07/02/15 1729 07/03/15 0758   07/03/15 0830  vancomycin (VANCOCIN) IVPB 1000 mg/200 mL premix     1,000 mg 200 mL/hr over 60 Minutes Intravenous Every 8 hours 07/03/15 0801 07/05/15 2359   07/03/15 0800  cefTRIAXone (ROCEPHIN) 2 g in dextrose 5 % 50 mL IVPB     2 g 100 mL/hr over 30 Minutes Intravenous Every 12 hours 07/03/15 0758 07/05/15 2359   06/29/15 2200  vancomycin (VANCOCIN) IVPB 1000 mg/200 mL premix  Status:  Discontinued     1,000 mg 200 mL/hr over 60 Minutes Intravenous Every 8 hours 06/29/15 1440 07/02/15 1712   06/26/15 1730  acyclovir (ZOVIRAX) 600 mg in dextrose 5 % 100 mL IVPB  Status:  Discontinued     600 mg 112 mL/hr over 60 Minutes Intravenous 3 times per day 06/26/15 1717 07/02/15 1031   06/26/15 1600  ampicillin (OMNIPEN) 2 g in sodium chloride 0.9 % 50 mL IVPB  Status:  Discontinued     2 g 150 mL/hr over 20 Minutes Intravenous 6 times per day 06/26/15 1529 06/26/15 1658   06/26/15 1530  vancomycin (VANCOCIN) IVPB 1000 mg/200 mL premix  Status:  Discontinued     1,000 mg 200 mL/hr over 60 Minutes Intravenous  Once 06/26/15 1529 06/26/15 1533   06/26/15 1530  cefTRIAXone (ROCEPHIN) 2 g in dextrose 5 % 50 mL IVPB  Status:  Discontinued     2 g 100 mL/hr over 30 Minutes Intravenous Every 12 hours 06/26/15 1529 07/02/15 1729  06/26/15 1000  levofloxacin (LEVAQUIN) IVPB 750 mg  Status:  Discontinued     750 mg 100 mL/hr over 90 Minutes Intravenous Every 24 hours 06/25/15 2052 06/26/15 1530   06/26/15 0200  vancomycin (VANCOCIN) IVPB 1000 mg/200 mL premix  Status:  Discontinued     1,000 mg 200 mL/hr over 60 Minutes Intravenous Every 12 hours 06/25/15 2052 06/29/15 1439   06/25/15 2200  aztreonam (AZACTAM) 2 g in dextrose 5 % 50 mL IVPB  Status:  Discontinued     2 g 100 mL/hr over 30 Minutes Intravenous 3 times per day 06/25/15 2052 06/26/15 1530   06/25/15 2100  aztreonam (AZACTAM) 2 g in dextrose 5 % 50 mL IVPB  Status:   Discontinued     2 g 100 mL/hr over 30 Minutes Intravenous  Once 06/25/15 1625 06/25/15 2052   06/25/15 1430  acyclovir (ZOVIRAX) 600 mg in dextrose 5 % 100 mL IVPB     600 mg 112 mL/hr over 60 Minutes Intravenous  Once 06/25/15 1405 06/25/15 1601   06/25/15 1315  vancomycin (VANCOCIN) IVPB 1000 mg/200 mL premix    Comments:  ** Give 1000mg  IV x 2 bags total back to back to = total dose of 2000mg  **   1,000 mg 200 mL/hr over 60 Minutes Intravenous  Once 06/25/15 1115 06/25/15 1459   06/25/15 1215  vancomycin (VANCOCIN) IVPB 1000 mg/200 mL premix    Comments:  ** Give 1000mg  IV x 2 bags total back to back to = total dose of 2000mg  **   1,000 mg 200 mL/hr over 60 Minutes Intravenous  Once 06/25/15 1115 06/25/15 1403   06/25/15 1115  levofloxacin (LEVAQUIN) IVPB 750 mg     750 mg 100 mL/hr over 90 Minutes Intravenous  Once 06/25/15 1106 06/25/15 1336   06/25/15 1115  aztreonam (AZACTAM) 2 g in dextrose 5 % 50 mL IVPB     2 g 100 mL/hr over 30 Minutes Intravenous  Once 06/25/15 1106 06/25/15 1245   06/25/15 1115  vancomycin (VANCOCIN) IVPB 1000 mg/200 mL premix  Status:  Discontinued     1,000 mg 200 mL/hr over 60 Minutes Intravenous  Once 06/25/15 1106 06/25/15 1110     Code Status:     Code Status Orders        Start     Ordered   06/25/15 2147  Full code   Continuous     06/25/15 2146    Code Status History    Date Active Date Inactive Code Status Order ID Comments User Context   This patient has a current code status but no historical code status.    Advance Directive Documentation        Most Recent Value   Type of Advance Directive  Healthcare Power of Attorney [son josh]   Pre-existing out of facility DNR order (yellow form or pink MOST form)     "MOST" Form in Place?       Family Communication:  Disposition Plan: tx to med surg- being evaluated for CIR DVT prophylaxis: Lovenox Consultants: Neurology, ID Procedures: LP    Objective: Filed Weights   06/30/15  0330 07/01/15 0405 07/02/15 0456  Weight: 123.7 kg (272 lb 11.3 oz) 122.9 kg (270 lb 15.1 oz) 121.337 kg (267 lb 8 oz)    Intake/Output Summary (Last 24 hours) at 07/04/15 1107 Last data filed at 07/03/15 1956  Gross per 24 hour  Intake    140 ml  Output  0 ml  Net    140 ml     Vitals Filed Vitals:   07/03/15 1243 07/03/15 1533 07/03/15 2134 07/04/15 0503  BP:  156/63 123/95 128/63  Pulse:  64 83 62  Temp:   97.3 F (36.3 C) 98.5 F (36.9 C)  TempSrc:   Oral Oral  Resp:   18   Height:      Weight:      SpO2: 100% 98% 98% 93%    Exam:  General:  Pt is alert, confused to time and place, not in acute distress  HEENT: No icterus, No thrush, oral mucosa moist  Cardiovascular: regular rate and rhythm, S1/S2 No murmur  Respiratory: clear to auscultation bilaterally   Abdomen: Soft, +Bowel sounds, non tender, non distended, no guarding  MSK: No cyanosis or clubbing- no pedal edema   Data Reviewed: Basic Metabolic Panel:  Recent Labs Lab 06/28/15 1623 06/29/15 0327 06/30/15 0300 06/30/15 1001 07/01/15 0748 07/03/15 0300 07/04/15 0544  NA 141 141 142 142 141 142 142  K 4.7 3.5 2.7* 3.2* 3.1* 3.3* 3.5  CL 103 104 102  --  102 105 107  CO2 24 27 29   --  29 27 27   GLUCOSE 216* 148* 157* 162* 180* 114* 193*  BUN 8 8 6   --  6 7 8   CREATININE 0.80 0.61 0.64  --  0.67 0.64 0.75  CALCIUM 9.6 9.3 9.5  --  9.3 10.0 10.1  MG 2.0  --   --   --   --   --   --    Liver Function Tests:  Recent Labs Lab 06/28/15 0402 07/01/15 0748  AST 45* 42*  ALT 36 55*  ALKPHOS 51 58  BILITOT 0.6 0.8  PROT 6.2* 6.4*  ALBUMIN 3.0* 3.0*   No results for input(s): LIPASE, AMYLASE in the last 168 hours. No results for input(s): AMMONIA in the last 168 hours. CBC:  Recent Labs Lab 06/28/15 0402 06/29/15 0327 06/30/15 0300 06/30/15 1001 07/01/15 0748  WBC 13.0* 13.1* 12.5*  --  13.6*  NEUTROABS 8.9*  --   --   --  10.0*  HGB 12.9 14.0 13.1 13.9 13.0  HCT 37.0 40.1  39.7 41.0 39.2  MCV 89.4 88.7 91.3  --  91.8  PLT 242 241 282  --  309   Cardiac Enzymes: No results for input(s): CKTOTAL, CKMB, CKMBINDEX, TROPONINI in the last 168 hours. BNP (last 3 results)  Recent Labs  06/26/15 1355  BNP 362.7*    ProBNP (last 3 results) No results for input(s): PROBNP in the last 8760 hours.  CBG:  Recent Labs Lab 07/03/15 0832 07/03/15 1243 07/03/15 1703 07/03/15 2045 07/04/15 0906  GLUCAP 108* 148* 205* 227* 168*    Recent Results (from the past 240 hour(s))  Blood Culture (routine x 2)     Status: None   Collection Time: 06/25/15 11:05 AM  Result Value Ref Range Status   Specimen Description BLOOD LEFT ANTECUBITAL DRAWN BY RN  Final   Special Requests BOTTLES DRAWN AEROBIC AND ANAEROBIC [facility] EACH  Final   Culture NO GROWTH 8 DAYS  Final   Report Status 07/03/2015 FINAL  Final  Urine culture     Status: None   Collection Time: 06/25/15 11:06 AM  Result Value Ref Range Status   Specimen Description URINE, CATHETERIZED  Final   Special Requests NONE  Final   Culture   Final    NO GROWTH 1 DAY Performed  at [facility]    Report Status 06/26/2015 FINAL  Final  Blood Culture (routine x 2)     Status: None   Collection Time: 06/25/15 12:21 PM  Result Value Ref Range Status   Specimen Description BLOOD LEFT HAND  Final   Special Requests BOTTLES DRAWN AEROBIC ONLY 4CC  Final   Culture NO GROWTH 8 DAYS  Final   Report Status 07/03/2015 FINAL  Final  MRSA PCR Screening     Status: None   Collection Time: 06/25/15  9:08 PM  Result Value Ref Range Status   MRSA by PCR NEGATIVE NEGATIVE Final    Comment:        The GeneXpert MRSA Assay (FDA approved for NASAL specimens only), is one component of a comprehensive MRSA colonization surveillance program. It is not intended to diagnose MRSA infection nor to guide or monitor treatment for MRSA infections.   Culture, blood (x 2)     Status: None   Collection Time: 06/25/15 11:06 PM   Result Value Ref Range Status   Specimen Description LEFT ANTECUBITAL  Final   Special Requests BOTTLES DRAWN AEROBIC ONLY 4CC  Final   Culture NO GROWTH 5 DAYS  Final   Report Status 06/30/2015 FINAL  Final  Culture, blood (x 2)     Status: None   Collection Time: 06/25/15 11:16 PM  Result Value Ref Range Status   Specimen Description RIGHT ANTECUBITAL  Final   Special Requests IN PEDIATRIC BOTTLE 3CC  Final   Culture NO GROWTH 5 DAYS  Final   Report Status 06/30/2015 FINAL  Final  CSF culture     Status: None   Collection Time: 06/30/15 12:25 PM  Result Value Ref Range Status   Specimen Description CSF  Final   Special Requests NONE  Final   Gram Stain   Final    CYTOSPIN SMEAR WBC PRESENT, PREDOMINANTLY MONONUCLEAR NO ORGANISMS SEEN    Culture NO GROWTH 3 DAYS  Final   Report Status 07/03/2015 FINAL  Final     Studies: No results found.  Scheduled Meds:  Scheduled Meds: . antiseptic oral rinse  7 mL Mouth Rinse BID  . cefTRIAXone (ROCEPHIN)  IV  2 g Intravenous Q12H  . enoxaparin (LOVENOX) injection  40 mg Subcutaneous Daily  . insulin aspart  0-15 Units Subcutaneous TID WC  . insulin glargine  30 Units Subcutaneous QHS  . levETIRAcetam  500 mg Oral BID  . sodium chloride  3 mL Intravenous Q12H  . vancomycin  1,000 mg Intravenous Q8H   Continuous Infusions:    Time spent on care of this patient: 35 min   Christoph Copelan, MD 07/04/2015, 11:07 AM  LOS: 9 days   [facility] Office  [phone] Pager - Text Page per [email or web address] If 7PM-7AM, please contact night-coverage [email or web address]

## 2015-07-05 LAB — GLUCOSE, CAPILLARY
GLUCOSE-CAPILLARY: 149 mg/dL — AB (ref 65–99)
GLUCOSE-CAPILLARY: 205 mg/dL — AB (ref 65–99)
GLUCOSE-CAPILLARY: 176 mg/dL — AB (ref 65–99)
Glucose-Capillary: 142 mg/dL — ABNORMAL HIGH (ref 65–99)
Glucose-Capillary: 181 mg/dL — ABNORMAL HIGH (ref 65–99)

## 2015-07-05 NOTE — Progress Notes (Signed)
[facility] Progress Note   Jaliyah Fotheringham  ZOX:[id number]  DOB: 11/01/1950  DOA: 06/25/2015 PCP: Donzetta Sprung, MD  Brief narrative: Valerie Wagner is a 65 y.o. female with past medical history of diabetes mellitus type 2, hypertension and obesity who was brought to [facility] for altered mental status. Her husband reported that he had found her on the floor unresponsive. In route to the hospital she was combative and was given a milligram of Versed by EMS. She was found to have severe sepsis with a temperature of 102.6, WBC count of 19.3 and a lactic acid of 3.8. Initial blood pressure was 60/40. She was started on broad-spectrum antibiotics.   Subjective: [place] confused. She has no complaints  Assessment/Plan: Principal Problem:   Acute encephalopathy -Suspected to be secondary to sepsis and encephalitis remains quite confused.  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   Antibiotics: Anti-infectives    Start     Dose/Rate Route Frequency Ordered Stop   07/03/15 1900  vancomycin (VANCOCIN) IVPB 1000 mg/200 mL premix  Status:  Discontinued     1,000 mg 200 mL/hr over 60 Minutes Intravenous Every 8 hours 07/02/15 1712 07/03/15 0801   07/03/15 1900  cefTRIAXone (ROCEPHIN) 2 g in dextrose 5 % 50 mL IVPB  Status:  Discontinued     2 g 100 mL/hr over  30 Minutes Intravenous Every 12 hours 07/02/15 1729 07/03/15 0758   07/03/15 0830  vancomycin (VANCOCIN) IVPB 1000 mg/200 mL premix     1,000 mg 200 mL/hr over 60 Minutes Intravenous Every 8 hours 07/03/15 0801 07/06/15 0759   07/03/15 0800  cefTRIAXone (ROCEPHIN) 2 g in dextrose 5 % 50 mL IVPB     2 g 100 mL/hr over 30 Minutes Intravenous Every 12 hours 07/03/15 0758 07/05/15 2359   06/29/15 2200  vancomycin (VANCOCIN) IVPB 1000 mg/200 mL premix  Status:  Discontinued     1,000 mg 200 mL/hr over 60 Minutes Intravenous Every 8 hours 06/29/15 1440 07/02/15 1712   06/26/15 1730  acyclovir (ZOVIRAX) 600 mg in dextrose 5 % 100 mL IVPB  Status:  Discontinued     600 mg 112 mL/hr over 60 Minutes Intravenous 3 times per day 06/26/15 1717 07/02/15 1031   06/26/15 1600  ampicillin (OMNIPEN) 2 g in sodium chloride 0.9 % 50 mL IVPB  Status:  Discontinued     2 g 150 mL/hr over 20 Minutes Intravenous 6 times per day 06/26/15 1529 06/26/15 1658   06/26/15 1530  vancomycin (VANCOCIN) IVPB 1000 mg/200 mL premix  Status:  Discontinued     1,000 mg 200 mL/hr over 60 Minutes Intravenous  Once 06/26/15 1529 06/26/15 1533   06/26/15 1530  cefTRIAXone (ROCEPHIN) 2 g in dextrose 5 % 50 mL IVPB  Status:  Discontinued     2 g 100 mL/hr over 30 Minutes Intravenous Every 12 hours 06/26/15 1529 07/02/15 1729  06/26/15 1000  levofloxacin (LEVAQUIN) IVPB 750 mg  Status:  Discontinued     750 mg 100 mL/hr over 90 Minutes Intravenous Every 24 hours 06/25/15 2052 06/26/15 1530   06/26/15 0200  vancomycin (VANCOCIN) IVPB 1000 mg/200 mL premix  Status:  Discontinued     1,000 mg 200 mL/hr over 60 Minutes Intravenous Every 12 hours 06/25/15 2052 06/29/15 1439   06/25/15 2200  aztreonam (AZACTAM) 2 g in dextrose 5 % 50 mL IVPB  Status:  Discontinued     2 g 100 mL/hr over 30 Minutes Intravenous 3 times per day 06/25/15 2052 06/26/15 1530   06/25/15 2100  aztreonam (AZACTAM) 2 g in dextrose 5 % 50 mL IVPB  Status:   Discontinued     2 g 100 mL/hr over 30 Minutes Intravenous  Once 06/25/15 1625 06/25/15 2052   06/25/15 1430  acyclovir (ZOVIRAX) 600 mg in dextrose 5 % 100 mL IVPB     600 mg 112 mL/hr over 60 Minutes Intravenous  Once 06/25/15 1405 06/25/15 1601   06/25/15 1315  vancomycin (VANCOCIN) IVPB 1000 mg/200 mL premix    Comments:  ** Give 1000mg  IV x 2 bags total back to back to = total dose of 2000mg  **   1,000 mg 200 mL/hr over 60 Minutes Intravenous  Once 06/25/15 1115 06/25/15 1459   06/25/15 1215  vancomycin (VANCOCIN) IVPB 1000 mg/200 mL premix    Comments:  ** Give 1000mg  IV x 2 bags total back to back to = total dose of 2000mg  **   1,000 mg 200 mL/hr over 60 Minutes Intravenous  Once 06/25/15 1115 06/25/15 1403   06/25/15 1115  levofloxacin (LEVAQUIN) IVPB 750 mg     750 mg 100 mL/hr over 90 Minutes Intravenous  Once 06/25/15 1106 06/25/15 1336   06/25/15 1115  aztreonam (AZACTAM) 2 g in dextrose 5 % 50 mL IVPB     2 g 100 mL/hr over 30 Minutes Intravenous  Once 06/25/15 1106 06/25/15 1245   06/25/15 1115  vancomycin (VANCOCIN) IVPB 1000 mg/200 mL premix  Status:  Discontinued     1,000 mg 200 mL/hr over 60 Minutes Intravenous  Once 06/25/15 1106 06/25/15 1110     Code Status:     Code Status Orders        Start     Ordered   06/25/15 2147  Full code   Continuous     06/25/15 2146    Code Status History    Date Active Date Inactive Code Status Order ID Comments User Context   This patient has a current code status but no historical code status.    Advance Directive Documentation        Most Recent Value   Type of Advance Directive  Healthcare Power of Attorney [son josh]   Pre-existing out of facility DNR order (yellow form or pink MOST form)     "MOST" Form in Place?       Family Communication:  Disposition Plan: med surg- being evaluated for CIR DVT prophylaxis: Lovenox Consultants: Neurology, ID Procedures: LP    Objective: Filed Weights   07/02/15 0456  07/04/15 1643 07/05/15 0500  Weight: 121.337 kg (267 lb 8 oz) 122.653 kg (270 lb 6.4 oz) 119.84 kg (264 lb 3.2 oz)    Intake/Output Summary (Last 24 hours) at 07/05/15 1010 Last data filed at 07/05/15 0038  Gross per 24 hour  Intake   1230 ml  Output      0  ml  Net   1230 ml     Vitals Filed Vitals:   07/04/15 1806 07/05/15 0004 07/05/15 0500 07/05/15 0637  BP:  145/66  136/64  Pulse: 65 67  60  Temp:  98.1 F (36.7 C)  98.3 F (36.8 C)  TempSrc:  Oral  Oral  Resp:  18  18  Height:      Weight:   119.84 kg (264 lb 3.2 oz)   SpO2: 88% 95%  94%    Exam:  General:  Pt is alert, confused to time and place, not in acute distress  HEENT: No icterus, No thrush, oral mucosa moist  Cardiovascular: regular rate and rhythm, S1/S2 No murmur  Respiratory: clear to auscultation bilaterally   Abdomen: Soft, +Bowel sounds, non tender, non distended, no guarding  MSK: No cyanosis or clubbing- no pedal edema   Data Reviewed: Basic Metabolic Panel:  Recent Labs Lab 06/28/15 1623 06/29/15 0327 06/30/15 0300 06/30/15 1001 07/01/15 0748 07/03/15 0300 07/04/15 0544  NA 141 141 142 142 141 142 142  K 4.7 3.5 2.7* 3.2* 3.1* 3.3* 3.5  CL 103 104 102  --  102 105 107  CO2 24 27 29   --  29 27 27   GLUCOSE 216* 148* 157* 162* 180* 114* 193*  BUN 8 8 6   --  6 7 8   CREATININE 0.80 0.61 0.64  --  0.67 0.64 0.75  CALCIUM 9.6 9.3 9.5  --  9.3 10.0 10.1  MG 2.0  --   --   --   --   --   --    Liver Function Tests:  Recent Labs Lab 07/01/15 0748  AST 42*  ALT 55*  ALKPHOS 58  BILITOT 0.8  PROT 6.4*  ALBUMIN 3.0*   No results for input(s): LIPASE, AMYLASE in the last 168 hours. No results for input(s): AMMONIA in the last 168 hours. CBC:  Recent Labs Lab 06/29/15 0327 06/30/15 0300 06/30/15 1001 07/01/15 0748  WBC 13.1* 12.5*  --  13.6*  NEUTROABS  --   --   --  10.0*  HGB 14.0 13.1 13.9 13.0  HCT 40.1 39.7 41.0 39.2  MCV 88.7 91.3  --  91.8  PLT 241 282  --  309    Cardiac Enzymes: No results for input(s): CKTOTAL, CKMB, CKMBINDEX, TROPONINI in the last 168 hours. BNP (last 3 results)  Recent Labs  06/26/15 1355  BNP 362.7*    ProBNP (last 3 results) No results for input(s): PROBNP in the last 8760 hours.  CBG:  Recent Labs Lab 07/04/15 0906 07/04/15 1159 07/04/15 1650 07/05/15 0438 07/05/15 0750  GLUCAP 168* 178* 115* 142* 149*    Recent Results (from the past 240 hour(s))  Blood Culture (routine x 2)     Status: None   Collection Time: 06/25/15 11:05 AM  Result Value Ref Range Status   Specimen Description BLOOD LEFT ANTECUBITAL DRAWN BY RN  Final   Special Requests BOTTLES DRAWN AEROBIC AND ANAEROBIC [facility] EACH  Final   Culture NO GROWTH 8 DAYS  Final   Report Status 07/03/2015 FINAL  Final  Urine culture     Status: None   Collection Time: 06/25/15 11:06 AM  Result Value Ref Range Status   Specimen Description URINE, CATHETERIZED  Final   Special Requests NONE  Final   Culture   Final    NO GROWTH 1 DAY Performed at [facility]    Report Status 06/26/2015 FINAL  Final  Blood Culture (routine x 2)     Status: None   Collection Time: 06/25/15 12:21 PM  Result Value Ref Range Status   Specimen Description BLOOD LEFT HAND  Final   Special Requests BOTTLES DRAWN AEROBIC ONLY 4CC  Final   Culture NO GROWTH 8 DAYS  Final   Report Status 07/03/2015 FINAL  Final  MRSA PCR Screening     Status: None   Collection Time: 06/25/15  9:08 PM  Result Value Ref Range Status   MRSA by PCR NEGATIVE NEGATIVE Final    Comment:        The GeneXpert MRSA Assay (FDA approved for NASAL specimens only), is one component of a comprehensive MRSA colonization surveillance program. It is not intended to diagnose MRSA infection nor to guide or monitor treatment for MRSA infections.   Culture, blood (x 2)     Status: None   Collection Time: 06/25/15 11:06 PM  Result Value Ref Range Status   Specimen Description LEFT ANTECUBITAL   Final   Special Requests BOTTLES DRAWN AEROBIC ONLY 4CC  Final   Culture NO GROWTH 5 DAYS  Final   Report Status 06/30/2015 FINAL  Final  Culture, blood (x 2)     Status: None   Collection Time: 06/25/15 11:16 PM  Result Value Ref Range Status   Specimen Description RIGHT ANTECUBITAL  Final   Special Requests IN PEDIATRIC BOTTLE 3CC  Final   Culture NO GROWTH 5 DAYS  Final   Report Status 06/30/2015 FINAL  Final  CSF culture     Status: None   Collection Time: 06/30/15 12:25 PM  Result Value Ref Range Status   Specimen Description CSF  Final   Special Requests NONE  Final   Gram Stain   Final    CYTOSPIN SMEAR WBC PRESENT, PREDOMINANTLY MONONUCLEAR NO ORGANISMS SEEN    Culture NO GROWTH 3 DAYS  Final   Report Status 07/03/2015 FINAL  Final     Studies: No results found.  Scheduled Meds:  Scheduled Meds: . antiseptic oral rinse  7 mL Mouth Rinse BID  . benazepril  20 mg Oral Daily   And  . hydrochlorothiazide  12.5 mg Oral Daily  . cefTRIAXone (ROCEPHIN)  IV  2 g Intravenous Q12H  . enoxaparin (LOVENOX) injection  40 mg Subcutaneous Daily  . insulin aspart  0-15 Units Subcutaneous TID WC  . insulin glargine  30 Units Subcutaneous QHS  . levETIRAcetam  500 mg Oral BID  . sodium chloride  3 mL Intravenous Q12H  . vancomycin  1,000 mg Intravenous Q8H   Continuous Infusions:    Time spent on care of this patient: 35 min   Enaya Howze, MD 07/05/2015, 10:10 AM  LOS: 10 days   [facility] Office  [phone] Pager - Text Page per [email or web address] If 7PM-7AM, please contact night-coverage [email or web address]

## 2015-07-06 LAB — GLUCOSE, CAPILLARY
GLUCOSE-CAPILLARY: 209 mg/dL — AB (ref 65–99)
GLUCOSE-CAPILLARY: 214 mg/dL — AB (ref 65–99)
Glucose-Capillary: 369 mg/dL — ABNORMAL HIGH (ref 65–99)

## 2015-07-06 LAB — CBC
HCT: 43.9 % (ref 36.0–46.0)
Hemoglobin: 14.1 g/dL (ref 12.0–15.0)
MCH: 30 pg (ref 26.0–34.0)
MCHC: 32.1 g/dL (ref 30.0–36.0)
MCV: 93.4 fL (ref 78.0–100.0)
PLATELETS: 341 10*3/uL (ref 150–400)
RBC: 4.7 MIL/uL (ref 3.87–5.11)
RDW: 12.9 % (ref 11.5–15.5)
WBC: 13.1 10*3/uL — AB (ref 4.0–10.5)

## 2015-07-06 MED ORDER — INSULIN GLARGINE 100 UNIT/ML ~~LOC~~ SOLN: 30.0000 [IU] | Freq: Every day | SUBCUTANEOUS | Status: DC

## 2015-07-06 MED ORDER — INSULIN GLARGINE 100 UNIT/ML ~~LOC~~ SOLN
40.0000 [IU] | Freq: Every day | SUBCUTANEOUS | Status: DC
  Administered 2015-07-06: 40 [IU] via SUBCUTANEOUS
  Filled 2015-07-06 (×2): qty 0.4

## 2015-07-06 MED ORDER — LEVETIRACETAM 500 MG PO TABS
500.0000 mg | ORAL_TABLET | Freq: Two times a day (BID) | ORAL | Status: DC
  Administered 2015-07-06 – 2015-07-07 (×2): 500 mg via ORAL
  Filled 2015-07-06 (×2): qty 1

## 2015-07-06 MED ORDER — LEVETIRACETAM 500 MG PO TABS: 500.0000 mg | ORAL_TABLET | Freq: Two times a day (BID) | ORAL | Status: DC

## 2015-07-06 MED ORDER — PREGABALIN 25 MG PO CAPS
50.0000 mg | ORAL_CAPSULE | Freq: Two times a day (BID) | ORAL | Status: DC
  Administered 2015-07-06 – 2015-07-07 (×3): 50 mg via ORAL
  Filled 2015-07-06 (×3): qty 2

## 2015-07-06 NOTE — Progress Notes (Signed)
Nutrition Follow-up  DOCUMENTATION CODES:   Obesity unspecified  INTERVENTION:   -Magic Cup TID with meals  NUTRITION DIAGNOSIS:   Inadequate oral intake related to lethargy/confusion as evidenced by meal completion < 50%.  Ongoing  GOAL:   Patient will meet greater than or equal to 90% of their needs  Progressing   MONITOR:   Skin, PO intake, Supplement acceptance, Diet advancement, Labs, Weight trends, I & O's  REASON FOR ASSESSMENT:   Consult Enteral/tube feeding initiation and management  ASSESSMENT:   65 y.o. Female with toxic metabolic encephalopathy.  Pt pulled out feeding tube on 06/30/15. Pt underwent fluro guided lumbar puncture on 06/30/15; cytology pending.   Pt unable to complete FEES on 07/01/15 and underwent MBSS on 07/01/15 and recommended dysphagia 2 diet with nectar thick liquids due to mild to moderate dysphagia. SLP downgraded diet on 07/02/15 to dysphagia 1 diet with nectar thick liquids due to decreased cognition and impulsivity.   Spoke with RN who reports pt with variable alertness. She is tolerating current diet textures well. Per RN, pt consumed grits and Magic Cup for breakfast. Noted 75% of lunch tray completed. Meal completion variable; PO: 0-75%.   Per RN, plan to d/c to CIR vs SNF once medically stable.   Labs reviewed: CBGS: 176-369.  Diet Order:  DIET DYS 2 Room service appropriate?: Yes; Fluid consistency:: Nectar Thick Diet - low sodium heart healthy  Skin:  Reviewed, no issues  Last BM:  07/05/15  Height:   Ht Readings from Last 1 Encounters:  06/25/15 5\' 4"  (1.626 m)    Weight:   Wt Readings from Last 1 Encounters:  07/06/15 261 lb 4.8 oz (118.525 kg)    Ideal Body Weight:  54.5 kg  BMI:  Body mass index is 44.83 kg/(m^2).  Estimated Nutritional Needs:   Kcal:  2000-2200  Protein:  100-110 gm  Fluid:  2.02-2. L  EDUCATION NEEDS:   No education needs identified at this time  Sadiya Durand A. Mayford KnifeWilliams, RD, LDN,  CDE Pager: [phone] After hours Pager: [phone]

## 2015-07-06 NOTE — NC FL2 (Signed)
[place] MEDICAID FL2 LEVEL OF CARE SCREENING TOOL     IDENTIFICATION  Patient Name: Valerie Wagner Birthdate: 11/22/1950 Sex: female Admission Date (Current Location): 06/25/2015  [facility] [place]   Facility and Address:         Provider Number:    Attending Physician Name and Address:  Calvert CantorSaima Rizwan, MD  Relative Name and Phone Number:  Bobbe MedicoDillard, Spouse, [phone]    Current Level of Care: Hospital Recommended Level of Care: Skilled Nursing Facility Prior Approval Number:    Date Approved/Denied:   PASRR Number: [id number]  Discharge Plan: SNF    Current Diagnoses: Patient Active Problem List   Diagnosis Date Noted  . Hypoxemia   . Meningoencephalitis   . Acute encephalopathy 06/26/2015  . Polyneuropathy (HCC) 06/26/2015  . Severe obesity (BMI >= 40) (HCC) 06/26/2015  . Fever 06/26/2015  . Sepsis (HCC)   . Type 2 diabetes mellitus without complication, without long-term current use of insulin (HCC)   . Severe sepsis (HCC) 06/25/2015  . Somnolence 06/25/2015  . Chronic pain 06/25/2015  . Type 2 diabetes mellitus (HCC)   . Atypical chest pain 02/09/2012  . Essential hypertension, benign 02/09/2012  . Mixed hyperlipidemia 02/09/2012    Orientation RESPIRATION BLADDER Height & Weight    Self  Normal Incontinent 5\' 4"  (162.6 cm) 261 lbs.  BEHAVIORAL SYMPTOMS/MOOD NEUROLOGICAL BOWEL NUTRITION STATUS      Incontinent  (Please See DC summary)  AMBULATORY STATUS COMMUNICATION OF NEEDS Skin   Extensive Assist Verbally Normal                       Personal Care Assistance Level of Assistance  Bathing, Feeding, Dressing Bathing Assistance: Maximum assistance Feeding assistance: Maximum assistance Dressing Assistance: Maximum assistance     Functional Limitations Info             SPECIAL CARE FACTORS FREQUENCY  PT (By licensed PT)     PT Frequency: 5x/week              Contractures      Additional Factors  Info  Code Status, Allergies, Insulin Sliding Scale Code Status Info: Full Allergies Info: Nsaids, Bee Venom, Chocolate, Darvon, Ivp Dye, Doxycycline, Penicillins   Insulin Sliding Scale Info: insulin aspart (novoLOG) injection 0-15 Units; insulin glargine (LANTUS) injection 30 Units       Current Medications (07/06/2015):  This is the current hospital active medication list Current Facility-Administered Medications  Medication Dose Route Frequency Provider Last Rate Last Dose  . acetaminophen (TYLENOL) tablet 650 mg  650 mg Oral Q6H PRN Rolly SalterPranav M Patel, MD   650 mg at 07/04/15 0234   Or  . acetaminophen (TYLENOL) suppository 650 mg  650 mg Rectal Q6H PRN Rolly SalterPranav M Patel, MD   650 mg at 06/28/15 0016  . antiseptic oral rinse (CPC / CETYLPYRIDINIUM CHLORIDE 0.05%) solution 7 mL  7 mL Mouth Rinse BID Rolly SalterPranav M Patel, MD   7 mL at 07/05/15 2239  . benazepril (LOTENSIN) tablet 20 mg  20 mg Oral Daily Renaee MundaKendra P Hiatt, RPH   20 mg at 07/05/15 1708   And  . hydrochlorothiazide (MICROZIDE) capsule 12.5 mg  12.5 mg Oral Daily Kendra P Hiatt, RPH   12.5 mg at 07/05/15 1019  . enoxaparin (LOVENOX) injection 40 mg  40 mg Subcutaneous Daily Calvert CantorSaima Rizwan, MD   40 mg at 07/05/15 1019  . haloperidol lactate (HALDOL) injection 1 mg  1 mg Intravenous  Q6H PRN Rolly Salter, MD   1 mg at 06/27/15 1409  . haloperidol lactate (HALDOL) injection 5 mg  5 mg Intramuscular Q6H PRN Calvert Cantor, MD   5 mg at 07/02/15 1758  . insulin aspart (novoLOG) injection 0-15 Units  0-15 Units Subcutaneous TID WC Calvert Cantor, MD   5 Units at 07/05/15 1813  . insulin glargine (LANTUS) injection 30 Units  30 Units Subcutaneous QHS Calvert Cantor, MD   30 Units at 07/05/15 2239  . levETIRAcetam (KEPPRA) 100 MG/ML solution 500 mg  500 mg Oral BID Calvert Cantor, MD   500 mg at 07/05/15 2239  . morphine 2 MG/ML injection 1-2 mg  1-2 mg Intravenous Q4H PRN Rolly Salter, MD   1 mg at 07/05/15 1027  . ondansetron (ZOFRAN) tablet 4 mg  4 mg  Oral Q6H PRN Clydia Llano, MD       Or  . ondansetron (ZOFRAN) injection 4 mg  4 mg Intravenous Q6H PRN Clydia Llano, MD   4 mg at 06/26/15 0551  . RESOURCE THICKENUP CLEAR   Oral PRN Calvert Cantor, MD      . sodium chloride 0.9 % injection 3 mL  3 mL Intravenous Q12H Clydia Llano, MD   3 mL at 07/05/15 2240     Discharge Medications: Please see discharge summary for a list of discharge medications.  Relevant Imaging Results:  Relevant Lab Results:   Additional Information SSN: [id number]  Mearl Latin, LCSWA

## 2015-07-06 NOTE — Care Management Note (Signed)
Case Management Note  Patient Details  Name: Valerie Wagner MRN: [id number] Date of Birth: 1950/08/26  Subjective/Objective:                  DC to SNF today as facilitated through CSW.   Action/Plan:   Expected Discharge Date:                  Expected Discharge Plan:  Skilled Nursing Facility  In-House Referral:  Clinical Social Work  Discharge planning Services  CM Consult  Post Acute Care Choice:    Choice offered to:     DME Arranged:    DME Agency:     HH Arranged:    HH Agency:     Status of Service:  Completed, signed off  Medicare Important Message Given:    Date Medicare IM Given:    Medicare IM give by:    Date Additional Medicare IM Given:    Additional Medicare Important Message give by:     If discussed at Long Length of Stay Meetings, dates discussed:    Additional Comments:  Lawerance SabalDebbie Chancey Cullinane, RN 07/06/2015, 1:06 PM

## 2015-07-06 NOTE — Progress Notes (Addendum)
Inpatient Rehabilitation  I stopped by pt's room to attempt to discuss her post acute rehab needs.  Pt. was quite sleepy and would awaken only briefly before falling back to sleep.  I attempted to call both phone numbers in pt's emergency contacts but was unable to get to speak with anyone.  The [phone] number consistently gave a fast busy signal.  The second number apparently belonging to [place], pt's son did not have a voicemail box set up and I was unable to get an answer on several trials.  I phoned the number listed in the treatment team stick note for Valerie Wagner at [phone].  I was able to leave a voicemail requesting that she return my call for discuss of post acute rehab plans.  In review of the chart, particularly of the PT note dated 07/03/15, pt. Is currently at a +3 assist level for transfers.  Per neurology note on 07/04/15 Dr. Amada JupiterKirkpatrick anticipated the pt. will have a slow recovery and will likely need prolonged rehabilitation.   Will continue to attempt to reach out to family and will provide when available.  I have discussed with Valerie Wagner RNCM and with Valerie Wagner, CSW .  [facility] is working on SNF backup in the event that pt. Cannot come for IP Rehab.  Please call if questions.  Weldon PickingSusan Chyrel Taha PT Inpatient Rehab Admissions Coordinator Cell [phone] Office [phone]

## 2015-07-06 NOTE — Progress Notes (Signed)
Speech Language Pathology Treatment: Dysphagia  Patient Details Name: Valerie Wagner MRN: [id number] DOB: September 13, 1950 Today's Date: 07/06/2015 Time: [id number] SLP Time Calculation (min) (ACUTE ONLY): 18 min  Assessment / Plan / Recommendation Clinical Impression  Pt demonstrates improved arousal and attention to meals today. Mastication with solids WNL though moderate verbal cueing needed for second swallow and occasional throat clearing noted, likely related to pharyngeal residuals. Pt disliked foods on tray and ate very little. Recommend upgrading diet back to dys 2 given improved mentation for increased PO intake. Continue nectar thick liquids. SLP to follow for tolerance.    HPI HPI: 65 y.o. female with past medical history of type 2 diabetes mellitus, essential hypertension, GERD, COPD, fibromyalgia, chronic back pain and obesity admitted for altered mental status, fever and unresponsiveness. Found to be septic, neurologist suspects infectious process, toxic metabolic encephalopathy. CXR lungs hypoexpanded. Vascular congestion and cardiomegaly, with mildly increased interstitial markings, concerning for mild interstitial edema.      SLP Plan  Continue with current plan of care     Recommendations  Diet recommendations: Dysphagia 2 (fine chop);Thin liquid Liquids provided via: Cup Medication Administration: Whole meds with puree Supervision: Full supervision/cueing for compensatory strategies;Staff to assist with self feeding;Patient able to self feed Compensations: Minimize environmental distractions;Slow rate;Small sips/bites;Multiple dry swallows after each bite/sip Postural Changes and/or Swallow Maneuvers: Seated upright 90 degrees             Oral Care Recommendations: Oral care BID Follow up Recommendations: Skilled Nursing facility Plan: Continue with current plan of care     GO               [facility] Surgery Center LLCBonnie Caedin Mogan, MA CCC-SLP [phone]  Claudine MoutonDeBlois, Falecia Vannatter Caroline 07/06/2015,  11:44 AM

## 2015-07-06 NOTE — Progress Notes (Addendum)
Inpatient Rehabilitation  I spoke with pt's niece Valerie Wagner (RN in Short stay)  [phone] who states she is the primary contact for pt.  She expresses her desire that pt. come for IP Rehab but understands that pt. will not be able to return home following a short CIR stay and [facility] She stated that pt. has not been arousable for her today and that pt. Currently is unable to participate in intensive rehab.   We discussed that pt. would in all likelihood need SNF following a short rehab stay due to anticipated long recovery period per neurology note. I cannot offer admission to CIR today as pt. cannot participate. I will follow up tomorrow if pt. remains in house.  I have discussed with Valerie GoldmannNadia  Wagner, CSW.  Please call if questions.  Weldon PickingSusan Jasn Xia PT Inpatient Rehab Admissions Coordinator Cell [phone] Office [phone]  ADDENDUM:  [phone])  In my earlier conversation with Valerie Pyleenee Wagner, she expressed some safety concerns for her aunt in regards to pt's son and the home environment and potential hospital environments.  I discussed this with Wells FargoDebbie Swist, RNCM, Valerie Wagner, CSW and Fisher ScientificSmita Glosson, Publishing copyursing Director of [phone].    Weldon PickingSusan Arliss Hepburn

## 2015-07-06 NOTE — Discharge Summary (Addendum)
Physician Discharge Summary  Sanford Cohick ZOX:[id number] DOB: 02-19-1951 DOA: 06/25/2015  PCP: Donzetta Sprung, MD  Admit date: 06/25/2015 Discharge date: 07/07/2015  Time spent: 50 minutes  Follow up: She needs a Bmet, CBC in 3 days.  Will need assistance with feeds. Encourage PO liquid intake.   Discharge Condition: stable    Discharge Diagnoses:  Principal Problem:   Acute encephalopathy Active Problems:   Severe sepsis (HCC)   Meningoencephalitis   Essential hypertension, benign   Chronic pain   Polyneuropathy (HCC)   Severe obesity (BMI >= 40) (HCC)   Type 2 diabetes mellitus without complication, without long-term current use of insulin (HCC)   Fever   History of present illness:  Valerie Wagner is a 65 y.o. female with past medical history of diabetes mellitus type 2, hypertension and obesity who was brought to [facility] for altered mental status. Her husband reported that he had found her on the floor unresponsive. In route to the hospital she was combative and was given a milligram of Versed by EMS. She was found to have severe sepsis with a temperature of 102.6, WBC count of 19.3 and a lactic acid of 3.8. Initial blood pressure was 60/40. She was started on broad-spectrum antibiotics.  Hospital Course:  Principal Problem:  Acute encephalopathy -Suspected to be secondary to sepsis and encephalitis remains quite confused.  -MRI negative-EEG revealed "moderately severe generalize continuous nonspecific slowing of cerebral activity" - Neuro team started Keppra in case she was having seizures - mental status not yet returned quite to baseline despite completing treatment   Sepsis due to meningo-encephalitis - LP obtained on 1/10 revealed RBCs and Lymphocytes  - no bacteria seen on gram stain, CSF culture negative - HSV, Crypt antigen negative -ID has been managing antibiotics and she has completed a 1 wk course of Vanc and Ceftriaxone as recommended   Essential  hypertension, benign - BP was initially low and therefore antihypertensives were held - benazepril resumed on 1/11   Type 2 diabetes mellitus - A1c 10.7 -She takes glipizide and Lantus daily at home    Polyneuropathy -Resumed Lyrica     Severe obesity (BMI >= 40)  Body mass index is 45.89 kg/(m^2).  Procedures:  LP  Consultations:  Neurology  ID  Discharge Exam: Filed Weights   07/05/15 0500 07/06/15 0558 07/07/15 0701  Weight: 119.84 kg (264 lb 3.2 oz) 118.525 kg (261 lb 4.8 oz) 117.944 kg (260 lb 0.3 oz)   Filed Vitals:   07/06/15 2150 07/07/15 0513  BP: 115/64 113/50  Pulse: 65 64  Temp: 98.6 F (37 C) 98.7 F (37.1 C)  Resp: 16 20    General: AAO x 3, no distress Cardiovascular: RRR, no murmurs  Respiratory: clear to auscultation bilaterally GI: soft, non-tender, non-distended, bowel sound positive  Discharge Instructions You were cared for by a hospitalist during your hospital stay. If you have any questions about your discharge medications or the care you received while you were in the hospital after you are discharged, you can call the unit and asked to speak with the hospitalist on call if the hospitalist that took care of you is not available. Once you are discharged, your primary care physician will handle any further medical issues. Please note that NO REFILLS for any discharge medications will be authorized once you are discharged, as it is imperative that you return to your primary care physician (or establish a relationship with a primary care physician if you do not have  one) for your aftercare needs so that they can reassess your need for medications and monitor your lab values.      Discharge Instructions    Diet - low sodium heart healthy    Complete by:  As directed      Diet - low sodium heart healthy    Complete by:  As directed      Discharge instructions    Complete by:  As directed   She needs a Bmet, CBC in 3 days.  Will need  assistance with feeds. Encourage PO liquid intake.     Increase activity slowly    Complete by:  As directed      Increase activity slowly    Complete by:  As directed             Medication List    STOP taking these medications        ALPRAZolam 0.5 MG tablet  Commonly known as:  XANAX     glipiZIDE 10 MG 24 hr tablet  Commonly known as:  GLUCOTROL XL     HYDROcodone-acetaminophen 7.5-325 MG tablet  Commonly known as:  NORCO      TAKE these medications        benazepril-hydrochlorthiazide 20-12.5 MG tablet  Commonly known as:  LOTENSIN HCT  Take 2 tablets by mouth daily.     EPINEPHrine 0.15 MG/0.3ML injection  Commonly known as:  EPIPEN JR  Inject 0.15 mg into the muscle as needed for anaphylaxis.     Fluticasone-Salmeterol 500-50 MCG/DOSE Aepb  Commonly known as:  ADVAIR  Inhale 1 puff into the lungs every 12 (twelve) hours.     insulin glargine 100 UNIT/ML injection  Commonly known as:  LANTUS  Inject 0.4 mLs (40 Units total) into the skin at bedtime.     ipratropium-albuterol 0.5-2.5 (3) MG/3ML Soln  Commonly known as:  DUONEB  Take 3 mLs by nebulization every 4 (four) hours as needed (wheezing).     levETIRAcetam 500 MG tablet  Commonly known as:  KEPPRA  Take 1 tablet (500 mg total) by mouth 2 (two) times daily.     nitroGLYCERIN 0.4 MG SL tablet  Commonly known as:  NITROSTAT  Place 0.4 mg under the tongue every 5 (five) minutes as needed for chest pain.     pregabalin 50 MG capsule  Commonly known as:  LYRICA  Take 50 mg by mouth 2 (two) times daily.     tiotropium 18 MCG inhalation capsule  Commonly known as:  SPIRIVA  Place 18 mcg into inhaler and inhale daily.     UROCIT-K 15 15 MEQ (1620 MG) Tbcr  Generic drug:  Potassium Citrate  Take 1 tablet by mouth 2 (two) times daily.     VENTOLIN HFA 108 (90 Base) MCG/ACT inhaler  Generic drug:  albuterol  Inhale 2 puffs into the lungs every 6 (six) hours as needed for wheezing or shortness of  breath.       Allergies  Allergen Reactions  . Nsaids Shortness Of Breath  . Bee Venom   . Chocolate   . Darvon [Propoxyphene]   . Ivp Dye [Iodinated Diagnostic Agents] Swelling  . Doxycycline Rash  . Penicillins Rash   Follow-up Information    Follow up with [facility] .   Specialty:  Skilled Nursing Facility   Contact information:   [address] [place] [address] [phone]       The results of significant diagnostics from  this hospitalization (including imaging, microbiology, ancillary and laboratory) are listed below for reference.    Significant Diagnostic Studies: Ct Abdomen Pelvis Wo Contrast  06/25/2015  CLINICAL DATA:  Found on the floor unresponsive at 10 a.m. EXAM: CT ABDOMEN AND PELVIS WITHOUT CONTRAST TECHNIQUE: Multidetector CT imaging of the abdomen and pelvis was performed following the standard protocol without IV contrast. COMPARISON:  10/14/2013, 05/20/2014 FINDINGS: Lower chest:  Clear lung bases.  Normal heart size. Hepatobiliary: Normal liver.  Prior cholecystectomy. Pancreas: Normal. Spleen: Normal. Adrenals/Urinary Tract: Normal adrenal glands. 12 mm exophytic hypodense, fluid attenuating right renal mass most consistent with a cyst. No urolithiasis or obstructive uropathy. Decompressed bladder with a Foley catheter present. Stomach/Bowel: No bowel wall thickening or dilatation. Small hiatal hernia. No pneumatosis, pneumoperitoneum or portal venous gas. No abdominal pelvic free fluid. Vascular/Lymphatic: Normal caliber abdominal aorta with mild atherosclerosis. No lymphadenopathy. Reproductive: No adnexal mass.  Prior hysterectomy. Musculoskeletal: No acute osseous abnormality. No lytic or sclerotic osseous lesion. There is a 2.3 cm hypodense, fluid attenuating partially visualized mass in the left adductor brevis muscle concerning for a liquified hematoma versus ganglion cyst, but this is incompletely characterized. IMPRESSION: 1. No  acute abdominal or pelvic pathology. 2. 2.3 cm hypodense, fluid attenuating partially visualized mass in the left adductor brevis muscle concerning for a liquified hematoma versus ganglion cyst, but this is incompletely characterized. This is unchanged compared with 05/20/2014. Electronically Signed   By: Elige Ko   On: 06/25/2015 12:25   Ct Head Wo Contrast  06/25/2015  CLINICAL DATA:  Found on floor.  Altered mental status EXAM: CT HEAD WITHOUT CONTRAST TECHNIQUE: Contiguous axial images were obtained from the base of the skull through the vertex without intravenous contrast. COMPARISON:  MRI 01/22/2015 FINDINGS: Image quality degraded by significant motion. This study was repeated however there is persistent motion on the repeat exam Ventricle size normal. Negative for acute infarct. Negative for acute hemorrhage or mass. Subtle findings could be missed on the study given the amount of motion. IMPRESSION: Image quality degraded by significant motion. No acute abnormality. Repeat study recommended if symptoms persist. Electronically Signed   By: Marlan Palau M.D.   On: 06/25/2015 12:24   Mr Brain Wo Contrast  06/27/2015  CLINICAL DATA:  Found down by husband yesterday morning, poorly responsive. Febrile and hypotensive. History of diabetes, hyperlipidemia. EXAM: MRI HEAD WITHOUT CONTRAST TECHNIQUE: Multiplanar, multiecho pulse sequences of the brain and surrounding structures were obtained without intravenous contrast. COMPARISON:  CT head June 25, 2015 and MRI of the brain January 22, 2015 FINDINGS: Mildly motion degraded examination. The ventricles and sulci are normal for patient's age. No abnormal parenchymal signal, mass lesions, mass effect. No reduced diffusion to suggest acute ischemia. No susceptibility artifact to suggest hemorrhage. No abnormal extra-axial fluid collections. No extra-axial masses though, contrast enhanced sequences would be more sensitive. Normal major intracranial vascular  flow voids seen at the skull base. Ocular globes and orbital contents are unremarkable though not tailored for evaluation. No abnormal sellar expansion. No suspicious calvarial bone marrow signal. Craniocervical junction maintained. Small RIGHT maxillary mucosal retention cyst. Small bilateral mastoid effusions. Is LEFT parotid gland appears enlarged with a 15 mm focus of low T2 signal associated with reduced diffusion. IMPRESSION: Negative motion degraded noncontrast MRI brain. LEFT parotid gland may be enlarged, with superimposed 15 mm possible hematoma or mass. Recommend follow-up evaluation on a nonemergent basis. Electronically Signed   By: Awilda Metro M.D.   On: 06/27/2015 03:23  Ir Fluoro Guide Ndl Plmt / Bx  06/30/2015  INDICATION: Concern for meningoencephalitis. Attempted though ultimately unsuccessful fluoroscopic guided lumbar puncture on 06/27/2015 secondary to patient agitation. Patient returns today for fluoroscopic guided lumbar puncture with general anesthesia. EXAM: FLUOROSCOPIC GUIDED LUMBAR PUNCTURE WITH OPENING AND CLOSING PRESSURES COMPARISON:  Attempted fluoroscopic guided lumbar puncture - 06/27/2015 MEDICATIONS: None ANESTHESIA/SEDATION: General anesthesia, as administered by the anesthesia department CONTRAST:  None. FLUOROSCOPY TIME:  1 minute, 24 seconds (36 mGy) COMPLICATIONS: None immediate TECHNIQUE: Informed written consent was obtained from the patient's family after a discussion of the risks, benefits and alternatives to treatment. Questions regarding the procedure were encouraged and answered. A timeout was performed prior to the initiation of the procedure. The patient was placed prone, slightly RPO on the fluoroscopy table. The left at L2 - L3 transverse foramina was marked fluoroscopically. The skin overlying the operative site was prepped and draped in the usual sterile fashion. Local anesthesia was provided with 1% lidocaine. Under intermittent fluoroscopic guidance,  a 6 inch 20 gauge spinal needle was advanced into the spinal canal. Appropriate positioning was confirmed with the efflux of slightly cloudy CSF. Opening pressure was obtained. Approximately 14 ml of cerebrospinal fluid was collected into four separate containers. Closing pressure was unable to be obtained secondary to the size of the access needle. Initial sample was noted to be slightly cloudy, potentially secondary to prior attempted fluoroscopic guided lumbar puncture though the final 3 samples appeared clear and normal in appearance. All aspirated samples were sent to the Laboratory as requested per the clinical team. The needle was removed and hemostasis was achieved with manual compression. A dressing was placed. The patient tolerated the above procedure well without immediate postprocedural complication. FINDINGS: Fluoroscopic imaging demonstrates appropriate positioning of the spinal needle within the spinal canal. Successful fluoroscopic lumbar puncture yielding approximately 14 ml of clear cerebrospinal fluid. Opening pressure - 23 mmHg Closing pressure - unable to be obtained secondary to the necessary size of the access needle. IMPRESSION: Technically successful fluoroscopic guided lumbar puncture with opening pressure of 23 mmHg. Samples were sent to the Laboratory as requested per the clinical team. Electronically Signed   By: Simonne Come M.D.   On: 06/30/2015 12:06   Dg Chest Port 1 View  06/30/2015  CLINICAL DATA:  Hypoxia, shortness of breath; sepsis, former smoker. EXAM: PORTABLE CHEST 1 VIEW COMPARISON:  Portable chest x-ray of June 28, 2015 FINDINGS: The lungs remain well-expanded and clear. The heart it remains enlarged. The pulmonary vascularity is not engorged. The mediastinum is normal in width. There is no pleural effusion or pneumothorax. The observed bony thorax exhibits no acute abnormality. IMPRESSION: Interval resolution of pulmonary interstitial edema. Stable mild enlargement of  the cardiac silhouette. There is no evidence of pneumonia. Electronically Signed   By: David  [place]and M.D.   On: 06/30/2015 09:36   Dg Chest Port 1 View  06/28/2015  CLINICAL DATA:  Acute onset of dyspnea.  Initial encounter. EXAM: PORTABLE CHEST 1 VIEW COMPARISON:  Chest radiograph from 06/25/2015 FINDINGS: The lungs are hypoexpanded. Vascular congestion is noted, with mildly increased interstitial markings, concerning for mild interstitial edema. No definite pleural effusion or pneumothorax is seen. The cardiomediastinal silhouette is enlarged. No acute osseous abnormalities are seen. IMPRESSION: Lungs hypoexpanded. Vascular congestion and cardiomegaly, with mildly increased interstitial markings, concerning for mild interstitial edema. Electronically Signed   By: Roanna Raider M.D.   On: 06/28/2015 06:21   Dg Chest Port 1 View  06/25/2015  CLINICAL DATA:  Sepsis.  Emesis immediately prior to the exam. EXAM: PORTABLE CHEST 1 VIEW COMPARISON:  Earlier this day at 1743 hour FINDINGS: Cardiomegaly and vascular congestion, unchanged allowing for differences in technique. No consolidation to suggest pneumonia. Unchanged elevation right hemidiaphragm. No pleural effusion or pneumothorax. IMPRESSION: Unchanged cardiomegaly and vascular congestion. No new abnormality is seen. Electronically Signed   By: Rubye OaksMelanie  Ehinger M.D.   On: 06/25/2015 22:43   Dg Chest Port 1 View  06/25/2015  CLINICAL DATA:  Altered mental status. EXAM: PORTABLE CHEST 1 VIEW COMPARISON:  01/21/2015 FINDINGS: Cardiac enlargement with vascular congestion. Mild right lower lobe atelectasis. No effusion. IMPRESSION: Cardiac enlargement with vascular congestion Mild right lower lobe atelectasis. Electronically Signed   By: Marlan Palauharles  Clark M.D.   On: 06/25/2015 11:44   Dg Chest Port 1v Same Day  06/25/2015  CLINICAL DATA:  Patient was found unresponsive this morning. New onset of wheezing and vomiting today. EXAM: PORTABLE CHEST 1 VIEW COMPARISON:   January 5th 05/09/2016 1 a.m. FINDINGS: The heart size and mediastinal contours are stable. Heart size is enlarged. There is central pulmonary vascular congestion. There is no focal pneumonia or pleural effusion. The visualized skeletal structures are stable. IMPRESSION: Cardiomegaly and central pulmonary vascular congestion not changed. Electronically Signed   By: Sherian ReinWei-Chen  Lin M.D.   On: 06/25/2015 17:58   Dg Abd Portable 1v  06/29/2015  CLINICAL DATA:  65 year old female with enteric tube placement. EXAM: PORTABLE ABDOMEN - 1 VIEW COMPARISON:  Earlier radiograph dated 06/29/2015 FINDINGS: An enteric tube is partially visualized with tip in the right lower abdomen, likely in the third portion or distal aspect of the second portion of the duodenum. Air is noted within the colon. Right upper quadrant cholecystectomy clips. The osseous structures are grossly unremarkable. IMPRESSION: Partially visualized enteric tube with tip in the right lower abdomen likely in the duodenum. Electronically Signed   By: Elgie CollardArash  Radparvar M.D.   On: 06/29/2015 21:07   Dg Abd Portable 1v  06/29/2015  CLINICAL DATA:  Feeding tube placement EXAM: PORTABLE ABDOMEN - 1 VIEW COMPARISON:  None. FINDINGS: Feeding tube tip is below the diaphragm with the tip not seen due to limitations from patient inability to cooperate for imaging. Visualized bowel gas pattern grossly normal. IMPRESSION: Feeding tube tip below the diaphragm with the actual tip not appreciable due to limitations of imaging from patient inability to cooperate. Electronically Signed   By: Bretta BangWilliam  Woodruff III M.D.   On: 06/29/2015 17:46   Dg Swallowing Func-speech Pathology  07/01/2015  Objective Swallowing Evaluation:   MBS Patient Details Name: Valerie Wagner MRN: [id number] Date of Birth: 1950-11-02 Today's Date: 07/01/2015 Time: SLP Start Time (ACUTE ONLY): 1200-SLP Stop Time (ACUTE ONLY): 1220 SLP Time Calculation (min) (ACUTE ONLY): 20 min Past Medical History: @PMH @  Past Surgical History: Past Surgical History Procedure Laterality Date . Tonsillectomy   . Hysterectomy -- unknown   . Cholecystectomy   . Carpal tunnel release     Right . Left thumb surgery   . Cesarean section   HPI: 65 y.o. female with past medical history of type 2 diabetes mellitus, essential hypertension, GERD, COPD, fibromyalgia, chronic back pain and obesity admitted for altered mental status, fever and unresponsiveness. Found to be septic, neurologist suspects infectious process, toxic metabolic encephalopathy. CXR lungs hypoexpanded. Vascular congestion and cardiomegaly, with mildly increased interstitial markings, concerning for mild interstitial edema. No Data Recorded Assessment / Plan / Recommendation CHL IP CLINICAL IMPRESSIONS 07/01/2015 Therapy Diagnosis  Mild oral phase dysphagia;Moderate oral phase dysphagia;Mild pharyngeal phase dysphagia Clinical Impression Mild-moderate oral dysphagia marked by decreased manipulation and delayed transit. Mild-moderate sensorimotor based pharyngeal dysphagia with decreased sensation (mild), reduced tongue base retraction  and decreased laryngeal elevation leading to delayed swallow initiation and mild-moderate vallecular and pyriform sinsus residue. Silent (mild) penetration following nectar trial after the initial swallow. No penetration/aspiration with thin barium, however risk is increased due to delayed initiation and an unusual increase in pharyngeal residue with thin compared to nectar. Recommend Dys 2 texture, nectar thick liquds, crush meds, no straws, two swallows and full supervision.  Impact on safety and function Moderate aspiration risk   CHL IP TREATMENT RECOMMENDATION 07/01/2015 Treatment Recommendations Therapy as outlined in treatment plan below   Prognosis 07/01/2015 Prognosis for Safe Diet Advancement Good Barriers to Reach Goals Cognitive deficits Barriers/Prognosis Comment -- CHL IP DIET RECOMMENDATION 07/01/2015 SLP Diet Recommendations  Dysphagia 2 (Fine chop) solids;Nectar thick liquid Liquid Administration via Cup;No straw Medication Administration Crushed with puree Compensations Minimize environmental distractions;Slow rate;Small sips/bites;Multiple dry swallows after each bite/sip Postural Changes Remain semi-upright after after feeds/meals (Comment)   CHL IP OTHER RECOMMENDATIONS 07/01/2015 Recommended Consults -- Oral Care Recommendations Oral care BID Other Recommendations --   CHL IP FOLLOW UP RECOMMENDATIONS 07/01/2015 Follow up Recommendations (No Data)   CHL IP FREQUENCY AND DURATION 07/01/2015 Speech Therapy Frequency (ACUTE ONLY) min 2x/week Treatment Duration 2 weeks      CHL IP ORAL PHASE 07/01/2015 Oral Phase Impaired Oral - Pudding Teaspoon -- Oral - Pudding Cup -- Oral - Honey Teaspoon -- Oral - Honey Cup -- Oral - Nectar Teaspoon Delayed oral transit;Weak lingual manipulation Oral - Nectar Cup Weak lingual manipulation;Delayed oral transit Oral - Nectar Straw -- Oral - Thin Teaspoon Delayed oral transit Oral - Thin Cup Delayed oral transit Oral - Thin Straw -- Oral - Puree -- Oral - Mech Soft -- Oral - Regular Delayed oral transit;Weak lingual manipulation;Impaired mastication Oral - Multi-Consistency -- Oral - Pill -- Oral Phase - Comment --  CHL IP PHARYNGEAL PHASE 07/01/2015 Pharyngeal Phase Impaired Pharyngeal- Pudding Teaspoon -- Pharyngeal -- Pharyngeal- Pudding Cup -- Pharyngeal -- Pharyngeal- Honey Teaspoon -- Pharyngeal -- Pharyngeal- Honey Cup -- Pharyngeal -- Pharyngeal- Nectar Teaspoon Delayed swallow initiation-pyriform sinuses;Penetration/Apiration after swallow;Pharyngeal residue - pyriform;Pharyngeal residue - valleculae;Reduced laryngeal elevation;Reduced tongue base retraction Pharyngeal Material enters airway, remains ABOVE vocal cords and not ejected out Pharyngeal- Nectar Cup Pharyngeal residue - valleculae;Pharyngeal residue - pyriform;Reduced tongue base retraction;Reduced laryngeal elevation Pharyngeal --  Pharyngeal- Nectar Straw -- Pharyngeal -- Pharyngeal- Thin Teaspoon Pharyngeal residue - pyriform;Pharyngeal residue - valleculae;Reduced tongue base retraction;Reduced laryngeal elevation Pharyngeal -- Pharyngeal- Thin Cup Pharyngeal residue - valleculae;Pharyngeal residue - pyriform;Reduced tongue base retraction;Reduced laryngeal elevation;Delayed swallow initiation-pyriform sinuses Pharyngeal -- Pharyngeal- Thin Straw -- Pharyngeal -- Pharyngeal- Puree -- Pharyngeal -- Pharyngeal- Mechanical Soft -- Pharyngeal -- Pharyngeal- Regular Pharyngeal residue - valleculae;Reduced tongue base retraction Pharyngeal -- Pharyngeal- Multi-consistency -- Pharyngeal -- Pharyngeal- Pill -- Pharyngeal -- Pharyngeal Comment --  CHL IP CERVICAL ESOPHAGEAL PHASE 07/01/2015 Cervical Esophageal Phase Impaired Pudding Teaspoon -- Pudding Cup -- Honey Teaspoon -- Honey Cup -- Nectar Teaspoon -- Nectar Cup -- Nectar Straw -- Thin Teaspoon -- Thin Cup -- Thin Straw -- Puree -- Mechanical Soft -- Regular -- Multi-consistency -- Pill -- Cervical Esophageal Comment -- Royce Macadamia 07/01/2015, 4:08 PM Breck Coons Litaker M.Ed CCC-SLP Pager [phone]              Dg Fluoro  Guide Lumbar Puncture  06/27/2015  CLINICAL DATA:  65 year old female inpatient with sepsis, fevers and acute encephalopathy concerning for meningoencephalitis. EXAM: DIAGNOSTIC LUMBAR PUNCTURE UNDER FLUOROSCOPIC GUIDANCE FLUOROSCOPY TIME:  Fluoroscopy Time (in minutes and seconds): 18 second Number of Acquired Images:  1 image capture PROCEDURE: Informed consent was obtained from the patient's family prior to the procedure, including potential complications of headache, allergy, and pain. Under fluoroscopy, the L4-5 level was chosen for lumbar puncture. With the patient prone, the lower back was prepped with Betadine. The patient was non-communicative and not alert to person, place or time and demonstrated significant agitation as soon as the lower back was prepped  with Betadine. Local anesthesia was attempted with 1% Lidocaine, however the patient's degree of agitation (despite pre-treatment with haldol and benzodiazepine) precluded safe performance of the procedure and the decision was made to discontinue the procedure. The spinal needle was not used. IMPRESSION: Lumbar puncture discontinued prior to spinal needle usage due to patient agitation. These results were called by telephone at the time of interpretation on 06/27/2015 at 3:24 pm to Dr. Lynden Oxford , who verbally acknowledged these results. Electronically Signed   By: Delbert Phenix M.D.   On: 06/27/2015 15:44    Microbiology: Recent Results (from the past 240 hour(s))  CSF culture     Status: None   Collection Time: 06/30/15 12:25 PM  Result Value Ref Range Status   Specimen Description CSF  Final   Special Requests NONE  Final   Gram Stain   Final    CYTOSPIN SMEAR WBC PRESENT, PREDOMINANTLY MONONUCLEAR NO ORGANISMS SEEN    Culture NO GROWTH 3 DAYS  Final   Report Status 07/03/2015 FINAL  Final     Labs: Basic Metabolic Panel:  Recent Labs Lab 07/01/15 0748 07/03/15 0300 07/04/15 0544 07/07/15 0722  NA 141 142 142 141  K 3.1* 3.3* 3.5 3.8  CL 102 105 107 104  CO2 29 27 27 27   GLUCOSE 180* 114* 193* 181*  BUN 6 7 8 18   CREATININE 0.67 0.64 0.75 1.22*  CALCIUM 9.3 10.0 10.1 10.4*   Liver Function Tests:  Recent Labs Lab 07/01/15 0748  AST 42*  ALT 55*  ALKPHOS 58  BILITOT 0.8  PROT 6.4*  ALBUMIN 3.0*   No results for input(s): LIPASE, AMYLASE in the last 168 hours. No results for input(s): AMMONIA in the last 168 hours. CBC:  Recent Labs Lab 07/01/15 0748 07/06/15 1333  WBC 13.6* 13.1*  NEUTROABS 10.0*  --   HGB 13.0 14.1  HCT 39.2 43.9  MCV 91.8 93.4  PLT 309 341   Cardiac Enzymes: No results for input(s): CKTOTAL, CKMB, CKMBINDEX, TROPONINI in the last 168 hours. BNP: BNP (last 3 results)  Recent Labs  06/26/15 1355  BNP 362.7*    ProBNP (last  3 results) No results for input(s): PROBNP in the last 8760 hours.  CBG:  Recent Labs Lab 07/05/15 2241 07/06/15 1009 07/06/15 1158 07/06/15 1700 07/07/15 0800  GLUCAP 176* 209* 369* 214* 189*       SignedCalvert Cantor, MD [facility] 07/07/2015, 12:55 PM

## 2015-07-07 LAB — BASIC METABOLIC PANEL
ANION GAP: 10 (ref 5–15)
BUN: 18 mg/dL (ref 6–20)
CHLORIDE: 104 mmol/L (ref 101–111)
CO2: 27 mmol/L (ref 22–32)
Calcium: 10.4 mg/dL — ABNORMAL HIGH (ref 8.9–10.3)
Creatinine, Ser: 1.22 mg/dL — ABNORMAL HIGH (ref 0.44–1.00)
GFR, EST AFRICAN AMERICAN: 54 mL/min — AB (ref >60)
GFR, EST NON AFRICAN AMERICAN: 46 mL/min — AB (ref >60)
Glucose, Bld: 181 mg/dL — ABNORMAL HIGH (ref 65–99)
POTASSIUM: 3.8 mmol/L (ref 3.5–5.1)
SODIUM: 141 mmol/L (ref 135–145)

## 2015-07-07 LAB — GLUCOSE, CAPILLARY
GLUCOSE-CAPILLARY: 189 mg/dL — AB (ref 65–99)
Glucose-Capillary: 213 mg/dL — ABNORMAL HIGH (ref 65–99)

## 2015-07-07 MED ORDER — LEVETIRACETAM 500 MG PO TABS: 500.0000 mg | ORAL_TABLET | Freq: Two times a day (BID) | ORAL | Status: DC

## 2015-07-07 MED ORDER — INSULIN GLARGINE 100 UNIT/ML ~~LOC~~ SOLN: 40.0000 [IU] | Freq: Every day | SUBCUTANEOUS | Status: DC

## 2015-07-07 NOTE — Progress Notes (Signed)
Per Edwyna Perfect,  she has spoken with pt's son Sharia Reeve who is agreeable with SNF level care for his mom.  I spoke with the patient this am.  She seems to have limited understanding of plans for her post acute rehab needs and plans.  I do not believe she can participate in 3 hours of therapies and that best venue of post acute rehabilitation is SNF for longer recovery period.  I will sign off.  Please call if questions.  Weldon Picking PT Inpatient Rehab Admissions Coordinator Cell [phone] Office [phone]

## 2015-07-07 NOTE — Clinical Social Work Placement (Signed)
   CLINICAL SOCIAL WORK PLACEMENT  NOTE  Date:  07/07/2015  Patient Details  Name: Valerie Wagner MRN: [id number] Date of Birth: 17-Nov-1950  Clinical Social Work is seeking post-discharge placement for this patient at the Skilled  Nursing Facility level of care (*CSW will initial, date and re-position this form in  chart as items are completed):  Yes   Patient/family provided with [place] Clinical Social Work Department's list of facilities offering this level of care within the geographic area requested by the patient (or if unable, by the patient's family).  Yes   Patient/family informed of their freedom to choose among providers that offer the needed level of care, that participate in Medicare, Medicaid or managed care program needed by the patient, have an available bed and are willing to accept the patient.  Yes   Patient/family informed of [facility]'s ownership interest in [facility] and [facility], as well as of the fact that they are under no obligation to receive care at these facilities.  PASRR submitted to EDS on 07/06/15     PASRR number received on 07/06/15     Existing PASRR number confirmed on       FL2 transmitted to all facilities in geographic area requested by pt/family on 07/06/15     FL2 transmitted to all facilities within larger geographic area on       Patient informed that his/her managed care company has contracts with or will negotiate with certain facilities, including the following:        Yes   Patient/family informed of bed offers received.  Patient chooses bed at [facility]     Physician recommends and patient chooses bed at      Patient to be transferred to [facility] on 07/07/15.  Patient to be transferred to facility by PTAR     Patient family notified on 07/07/15 of transfer.  Name of family member notified:  Sharia Reeve, son     PHYSICIAN Please prepare prescriptions     Additional Comment:     _______________________________________________ Mearl Latin, LCSWA 07/07/2015, 12:38 PM

## 2015-07-07 NOTE — Progress Notes (Signed)
Patient will DC to: [facility] Anticipated DC date: 07/07/15 Family notified: Son and niece Transport by: PTAR 1:30pm  CSW signing off.  Cristobal Goldmann, [place] Clinical Social Worker [phone]

## 2015-07-07 NOTE — Progress Notes (Signed)
Inpatient Diabetes Program Recommendations  AACE/ADA: New Consensus Statement on Inpatient Glycemic Control (2015)  Target Ranges:  Prepandial:   less than 140 mg/dL      Peak postprandial:   less than 180 mg/dL (1-2 hours)      Critically ill patients:  140 - 180 mg/dL   Review of Glycemic Control:  Results for ALGA, SOUTHALL (MRN [id number]) as of 07/07/2015 10:22  Ref. Range 07/05/2015 22:41 07/06/2015 10:09 07/06/2015 11:58 07/06/2015 17:00 07/07/2015 08:00  Glucose-Capillary Latest Ref Range: 65-99 mg/dL 308 (H) 657 (H) 846 (H) 214 (H) 189 (H)   Diabetes history: Type 2 diabetes Outpatient Diabetes medications: Lantus 70 units daily, Glipizide XL 10 mg daily Current orders for Inpatient glycemic control:  Novolog moderate tid with meals, Lantus 40 units daily  Inpatient Diabetes Program Recommendations:  Please consider adding Novolog meal coverage 4 units tid with meals (hold if pt. eats less than 50%).  Thanks, Beryl Meager, RN, BC-ADM Inpatient Diabetes Coordinator Pager [phone]  (8a-5p)

## 2015-07-07 NOTE — Progress Notes (Signed)
Report called to Nemiah Commander nurse at [facility]

## 2015-07-07 NOTE — Progress Notes (Signed)
Valerie Wagner to be D/C'd Skilled nursing facility per MD order.  Discussed with the patient and all questions fully answered.  VSS, Skin clean, dry and intact without evidence of skin break down, no evidence of skin tears noted. IV catheter discontinued intact. Site without signs and symptoms of complications. Dressing and pressure applied.  An After Visit Summary was printed and given to the patient. Patient received prescription.  D/c education completed with patient/family including follow up instructions, medication list, d/c activities limitations if indicated, with other d/c instructions as indicated by MD - patient able to verbalize understanding, all questions fully answered.   Patient instructed to return to ED, call 911, or call MD for any changes in condition.   Patient escorted via stretcher, and D/C home via PTAR.  Pura Spice 07/07/2015 1:18 PM

## 2015-07-07 NOTE — Progress Notes (Signed)
Speech Language Pathology Dysphagia Treatment Patient Details Name: Valerie Wagner MRN: [id number] DOB: May 05, 1951 Today's Date: 07/07/2015 Time: 6962-9528 SLP Time Calculation (min) (ACUTE ONLY): 14 min  Assessment / Plan / Recommendation Clinical Impression    SLP provided skilled observation during mealtime to advise diet advancement and educate pt on compensatory strategies. Pt exhibited oral holding and delayed swallow initiation. SLP provided verbal, visual and tactile cueing for compensatory strategies. Rationale for swallow x2 compensatory strategy was given to pt, but no evidence of learning. RN reported pt was removing whole meds from puree when administered, so meds are now crushed. Cognition and alertness are fluctuating. Dysphagia 2 diet and nectar thick liquids are advised at present until mental status improves.     Diet Recommendation    Dysphagia 2 diet, nectar thick liquids   SLP Plan Continue with current plan of care      Swallowing Goals     General Behavior/Cognition: Alert;Cooperative;Pleasant mood;Confused;Requires cueing Patient Positioning: Upright in bed HPI: 65 y.o. female with past medical history of type 2 diabetes mellitus, essential hypertension, GERD, COPD, fibromyalgia, chronic back pain and obesity admitted for altered mental status, fever and unresponsiveness. Found to be septic, neurologist suspects infectious process, toxic metabolic encephalopathy. CXR lungs hypoexpanded. Vascular congestion and cardiomegaly, with mildly increased interstitial markings, concerning for mild interstitial edema.       Dysphagia Treatment Treatment Methods: Skilled observation;Compensation strategy training;Patient/caregiver education Patient observed directly with PO's: Yes Type of PO's observed: Dysphagia 2 (chopped);Nectar-thick liquids Feeding: Able to feed self;Needs assist Liquids provided via: Cup;No straw Oral Phase Signs & Symptoms: Oral holding Pharyngeal  Phase Signs & Symptoms: Delayed throat clear;Suspected delayed swallow initiation Type of cueing: Verbal;Tactile;Visual Amount of cueing: Moderate   GO     Valerie Wagner 07/07/2015, 9:38 AM   [name], Student-SLP

## 2015-07-07 NOTE — Progress Notes (Signed)
[facility]  The patient was evaluated today and is stable for discharge. She is still quite confused. Still has a mild leukocytosis. No fevers noted. She has completed a 1 wk course of antibiotics as recommended by ID. Please see d/c summary from 1/16.   Calvert Cantor, MD

## 2015-08-13 ENCOUNTER — Encounter (HOSPITAL_COMMUNITY): Payer: Self-pay | Admitting: Internal Medicine

## 2015-08-13 LAB — ARBOVIRUS PANEL, ~~LOC~~ LAB

## 2018-08-10 DIAGNOSIS — I10 Essential (primary) hypertension: Secondary | ICD-10-CM | POA: Diagnosis not present

## 2018-08-10 DIAGNOSIS — J449 Chronic obstructive pulmonary disease, unspecified: Secondary | ICD-10-CM | POA: Diagnosis not present

## 2018-08-10 DIAGNOSIS — Z0000 Encounter for general adult medical examination without abnormal findings: Secondary | ICD-10-CM | POA: Diagnosis not present

## 2018-08-10 DIAGNOSIS — E1142 Type 2 diabetes mellitus with diabetic polyneuropathy: Secondary | ICD-10-CM | POA: Diagnosis not present

## 2018-08-10 DIAGNOSIS — Z23 Encounter for immunization: Secondary | ICD-10-CM | POA: Diagnosis not present

## 2018-08-10 DIAGNOSIS — E1165 Type 2 diabetes mellitus with hyperglycemia: Secondary | ICD-10-CM | POA: Diagnosis not present

## 2018-09-10 DIAGNOSIS — E1140 Type 2 diabetes mellitus with diabetic neuropathy, unspecified: Secondary | ICD-10-CM | POA: Diagnosis not present

## 2018-09-10 DIAGNOSIS — I10 Essential (primary) hypertension: Secondary | ICD-10-CM | POA: Diagnosis not present

## 2018-09-10 DIAGNOSIS — Z794 Long term (current) use of insulin: Secondary | ICD-10-CM | POA: Diagnosis not present

## 2018-09-10 DIAGNOSIS — Z888 Allergy status to other drugs, medicaments and biological substances status: Secondary | ICD-10-CM | POA: Diagnosis not present

## 2018-09-10 DIAGNOSIS — Z881 Allergy status to other antibiotic agents status: Secondary | ICD-10-CM | POA: Diagnosis not present

## 2018-09-10 DIAGNOSIS — Z91018 Allergy to other foods: Secondary | ICD-10-CM | POA: Diagnosis not present

## 2018-09-10 DIAGNOSIS — Z79899 Other long term (current) drug therapy: Secondary | ICD-10-CM | POA: Diagnosis not present

## 2018-09-10 DIAGNOSIS — J449 Chronic obstructive pulmonary disease, unspecified: Secondary | ICD-10-CM | POA: Diagnosis not present

## 2018-09-10 DIAGNOSIS — Z91013 Allergy to seafood: Secondary | ICD-10-CM | POA: Diagnosis not present

## 2018-09-10 DIAGNOSIS — Z91041 Radiographic dye allergy status: Secondary | ICD-10-CM | POA: Diagnosis not present

## 2018-09-10 DIAGNOSIS — Z91048 Other nonmedicinal substance allergy status: Secondary | ICD-10-CM | POA: Diagnosis not present

## 2018-09-10 DIAGNOSIS — Z87891 Personal history of nicotine dependence: Secondary | ICD-10-CM | POA: Diagnosis not present

## 2018-09-10 DIAGNOSIS — Z833 Family history of diabetes mellitus: Secondary | ICD-10-CM | POA: Diagnosis not present

## 2018-09-10 DIAGNOSIS — Z836 Family history of other diseases of the respiratory system: Secondary | ICD-10-CM | POA: Diagnosis not present

## 2018-09-10 DIAGNOSIS — Z880 Allergy status to penicillin: Secondary | ICD-10-CM | POA: Diagnosis not present

## 2018-09-10 DIAGNOSIS — Z91030 Bee allergy status: Secondary | ICD-10-CM | POA: Diagnosis not present

## 2018-09-10 DIAGNOSIS — Z8661 Personal history of infections of the central nervous system: Secondary | ICD-10-CM | POA: Diagnosis not present

## 2018-09-10 DIAGNOSIS — Z823 Family history of stroke: Secondary | ICD-10-CM | POA: Diagnosis not present

## 2018-09-10 DIAGNOSIS — X58XXXA Exposure to other specified factors, initial encounter: Secondary | ICD-10-CM | POA: Diagnosis not present

## 2018-09-10 DIAGNOSIS — E7800 Pure hypercholesterolemia, unspecified: Secondary | ICD-10-CM | POA: Diagnosis not present

## 2018-09-10 DIAGNOSIS — I69951 Hemiplegia and hemiparesis following unspecified cerebrovascular disease affecting right dominant side: Secondary | ICD-10-CM | POA: Diagnosis not present

## 2018-09-10 DIAGNOSIS — S00411A Abrasion of right ear, initial encounter: Secondary | ICD-10-CM | POA: Diagnosis not present

## 2018-09-10 DIAGNOSIS — M797 Fibromyalgia: Secondary | ICD-10-CM | POA: Diagnosis not present

## 2018-09-21 DIAGNOSIS — L03311 Cellulitis of abdominal wall: Secondary | ICD-10-CM | POA: Diagnosis not present

## 2018-09-21 DIAGNOSIS — J449 Chronic obstructive pulmonary disease, unspecified: Secondary | ICD-10-CM | POA: Diagnosis not present

## 2018-11-13 DIAGNOSIS — G252 Other specified forms of tremor: Secondary | ICD-10-CM | POA: Diagnosis not present

## 2018-11-13 DIAGNOSIS — E782 Mixed hyperlipidemia: Secondary | ICD-10-CM | POA: Diagnosis not present

## 2018-11-13 DIAGNOSIS — J449 Chronic obstructive pulmonary disease, unspecified: Secondary | ICD-10-CM | POA: Diagnosis not present

## 2018-11-13 DIAGNOSIS — I10 Essential (primary) hypertension: Secondary | ICD-10-CM | POA: Diagnosis not present

## 2018-11-13 DIAGNOSIS — E1143 Type 2 diabetes mellitus with diabetic autonomic (poly)neuropathy: Secondary | ICD-10-CM | POA: Diagnosis not present

## 2019-05-16 DIAGNOSIS — Z91041 Radiographic dye allergy status: Secondary | ICD-10-CM | POA: Diagnosis not present

## 2019-05-16 DIAGNOSIS — I10 Essential (primary) hypertension: Secondary | ICD-10-CM | POA: Diagnosis not present

## 2019-05-16 DIAGNOSIS — M1990 Unspecified osteoarthritis, unspecified site: Secondary | ICD-10-CM | POA: Diagnosis not present

## 2019-05-16 DIAGNOSIS — Z8661 Personal history of infections of the central nervous system: Secondary | ICD-10-CM | POA: Diagnosis not present

## 2019-05-16 DIAGNOSIS — I499 Cardiac arrhythmia, unspecified: Secondary | ICD-10-CM | POA: Diagnosis not present

## 2019-05-16 DIAGNOSIS — Z794 Long term (current) use of insulin: Secondary | ICD-10-CM | POA: Diagnosis not present

## 2019-05-16 DIAGNOSIS — Z880 Allergy status to penicillin: Secondary | ICD-10-CM | POA: Diagnosis not present

## 2019-05-16 DIAGNOSIS — R079 Chest pain, unspecified: Secondary | ICD-10-CM | POA: Diagnosis not present

## 2019-05-16 DIAGNOSIS — R0789 Other chest pain: Secondary | ICD-10-CM | POA: Diagnosis not present

## 2019-05-16 DIAGNOSIS — R0689 Other abnormalities of breathing: Secondary | ICD-10-CM | POA: Diagnosis not present

## 2019-05-16 DIAGNOSIS — K219 Gastro-esophageal reflux disease without esophagitis: Secondary | ICD-10-CM | POA: Diagnosis not present

## 2019-05-16 DIAGNOSIS — E7800 Pure hypercholesterolemia, unspecified: Secondary | ICD-10-CM | POA: Diagnosis not present

## 2019-05-16 DIAGNOSIS — E1140 Type 2 diabetes mellitus with diabetic neuropathy, unspecified: Secondary | ICD-10-CM | POA: Diagnosis not present

## 2019-05-16 DIAGNOSIS — I491 Atrial premature depolarization: Secondary | ICD-10-CM | POA: Diagnosis not present

## 2019-05-16 DIAGNOSIS — Z79899 Other long term (current) drug therapy: Secondary | ICD-10-CM | POA: Diagnosis not present

## 2019-05-16 DIAGNOSIS — Z87891 Personal history of nicotine dependence: Secondary | ICD-10-CM | POA: Diagnosis not present

## 2019-05-16 DIAGNOSIS — Z8673 Personal history of transient ischemic attack (TIA), and cerebral infarction without residual deficits: Secondary | ICD-10-CM | POA: Diagnosis not present

## 2019-05-16 DIAGNOSIS — Z91013 Allergy to seafood: Secondary | ICD-10-CM | POA: Diagnosis not present

## 2019-05-16 DIAGNOSIS — M797 Fibromyalgia: Secondary | ICD-10-CM | POA: Diagnosis not present

## 2019-05-16 DIAGNOSIS — J449 Chronic obstructive pulmonary disease, unspecified: Secondary | ICD-10-CM | POA: Diagnosis not present

## 2019-05-16 DIAGNOSIS — Z881 Allergy status to other antibiotic agents status: Secondary | ICD-10-CM | POA: Diagnosis not present

## 2019-05-16 DIAGNOSIS — Z888 Allergy status to other drugs, medicaments and biological substances status: Secondary | ICD-10-CM | POA: Diagnosis not present

## 2019-05-16 DIAGNOSIS — Z91030 Bee allergy status: Secondary | ICD-10-CM | POA: Diagnosis not present

## 2019-06-08 DIAGNOSIS — J45909 Unspecified asthma, uncomplicated: Secondary | ICD-10-CM | POA: Diagnosis not present

## 2019-06-08 DIAGNOSIS — J189 Pneumonia, unspecified organism: Secondary | ICD-10-CM | POA: Diagnosis not present

## 2019-06-08 DIAGNOSIS — Z880 Allergy status to penicillin: Secondary | ICD-10-CM | POA: Diagnosis not present

## 2019-06-08 DIAGNOSIS — Z79899 Other long term (current) drug therapy: Secondary | ICD-10-CM | POA: Diagnosis not present

## 2019-06-08 DIAGNOSIS — E7800 Pure hypercholesterolemia, unspecified: Secondary | ICD-10-CM | POA: Diagnosis not present

## 2019-06-08 DIAGNOSIS — Z794 Long term (current) use of insulin: Secondary | ICD-10-CM | POA: Diagnosis not present

## 2019-06-08 DIAGNOSIS — E1140 Type 2 diabetes mellitus with diabetic neuropathy, unspecified: Secondary | ICD-10-CM | POA: Diagnosis not present

## 2019-06-08 DIAGNOSIS — I10 Essential (primary) hypertension: Secondary | ICD-10-CM | POA: Diagnosis not present

## 2019-06-08 DIAGNOSIS — K219 Gastro-esophageal reflux disease without esophagitis: Secondary | ICD-10-CM | POA: Diagnosis not present

## 2019-06-08 DIAGNOSIS — G4733 Obstructive sleep apnea (adult) (pediatric): Secondary | ICD-10-CM | POA: Diagnosis not present

## 2019-06-08 DIAGNOSIS — I69951 Hemiplegia and hemiparesis following unspecified cerebrovascular disease affecting right dominant side: Secondary | ICD-10-CM | POA: Diagnosis not present

## 2019-06-08 DIAGNOSIS — Z87891 Personal history of nicotine dependence: Secondary | ICD-10-CM | POA: Diagnosis not present

## 2019-06-08 DIAGNOSIS — Z91041 Radiographic dye allergy status: Secondary | ICD-10-CM | POA: Diagnosis not present

## 2019-06-08 DIAGNOSIS — E1165 Type 2 diabetes mellitus with hyperglycemia: Secondary | ICD-10-CM | POA: Diagnosis not present

## 2019-06-08 DIAGNOSIS — Z888 Allergy status to other drugs, medicaments and biological substances status: Secondary | ICD-10-CM | POA: Diagnosis not present

## 2019-06-08 DIAGNOSIS — I491 Atrial premature depolarization: Secondary | ICD-10-CM | POA: Diagnosis not present

## 2019-06-08 DIAGNOSIS — E11319 Type 2 diabetes mellitus with unspecified diabetic retinopathy without macular edema: Secondary | ICD-10-CM | POA: Diagnosis not present

## 2019-06-08 DIAGNOSIS — R05 Cough: Secondary | ICD-10-CM | POA: Diagnosis not present

## 2019-06-08 DIAGNOSIS — M797 Fibromyalgia: Secondary | ICD-10-CM | POA: Diagnosis not present

## 2019-06-08 DIAGNOSIS — R0602 Shortness of breath: Secondary | ICD-10-CM | POA: Diagnosis not present

## 2019-06-08 DIAGNOSIS — Z881 Allergy status to other antibiotic agents status: Secondary | ICD-10-CM | POA: Diagnosis not present

## 2019-06-08 DIAGNOSIS — J449 Chronic obstructive pulmonary disease, unspecified: Secondary | ICD-10-CM | POA: Diagnosis not present

## 2019-06-08 DIAGNOSIS — Z1159 Encounter for screening for other viral diseases: Secondary | ICD-10-CM | POA: Diagnosis not present

## 2019-06-15 DIAGNOSIS — J441 Chronic obstructive pulmonary disease with (acute) exacerbation: Secondary | ICD-10-CM | POA: Diagnosis not present

## 2019-08-09 DIAGNOSIS — Z20822 Contact with and (suspected) exposure to covid-19: Secondary | ICD-10-CM | POA: Diagnosis not present

## 2019-08-09 DIAGNOSIS — Z794 Long term (current) use of insulin: Secondary | ICD-10-CM | POA: Diagnosis not present

## 2019-08-09 DIAGNOSIS — Z87891 Personal history of nicotine dependence: Secondary | ICD-10-CM | POA: Diagnosis not present

## 2019-08-09 DIAGNOSIS — J441 Chronic obstructive pulmonary disease with (acute) exacerbation: Secondary | ICD-10-CM | POA: Diagnosis not present

## 2019-08-09 DIAGNOSIS — R2242 Localized swelling, mass and lump, left lower limb: Secondary | ICD-10-CM | POA: Diagnosis not present

## 2019-08-09 DIAGNOSIS — R296 Repeated falls: Secondary | ICD-10-CM | POA: Diagnosis not present

## 2019-08-09 DIAGNOSIS — R6510 Systemic inflammatory response syndrome (SIRS) of non-infectious origin without acute organ dysfunction: Secondary | ICD-10-CM | POA: Diagnosis not present

## 2019-08-09 DIAGNOSIS — E1165 Type 2 diabetes mellitus with hyperglycemia: Secondary | ICD-10-CM | POA: Diagnosis not present

## 2019-08-09 DIAGNOSIS — R000 Tachycardia, unspecified: Secondary | ICD-10-CM | POA: Diagnosis not present

## 2019-08-09 DIAGNOSIS — R197 Diarrhea, unspecified: Secondary | ICD-10-CM | POA: Diagnosis not present

## 2019-08-09 DIAGNOSIS — E871 Hypo-osmolality and hyponatremia: Secondary | ICD-10-CM | POA: Diagnosis not present

## 2019-08-09 DIAGNOSIS — S93401A Sprain of unspecified ligament of right ankle, initial encounter: Secondary | ICD-10-CM | POA: Diagnosis not present

## 2019-08-09 DIAGNOSIS — Z7982 Long term (current) use of aspirin: Secondary | ICD-10-CM | POA: Diagnosis not present

## 2019-08-09 DIAGNOSIS — S7001XA Contusion of right hip, initial encounter: Secondary | ICD-10-CM | POA: Diagnosis not present

## 2019-08-09 DIAGNOSIS — Z87442 Personal history of urinary calculi: Secondary | ICD-10-CM | POA: Diagnosis not present

## 2019-08-09 DIAGNOSIS — W19XXXA Unspecified fall, initial encounter: Secondary | ICD-10-CM | POA: Diagnosis not present

## 2019-08-09 DIAGNOSIS — S0003XA Contusion of scalp, initial encounter: Secondary | ICD-10-CM | POA: Diagnosis not present

## 2019-08-09 DIAGNOSIS — A419 Sepsis, unspecified organism: Secondary | ICD-10-CM | POA: Diagnosis not present

## 2019-08-09 DIAGNOSIS — Z8616 Personal history of COVID-19: Secondary | ICD-10-CM | POA: Diagnosis not present

## 2019-08-09 DIAGNOSIS — I10 Essential (primary) hypertension: Secondary | ICD-10-CM | POA: Diagnosis not present

## 2019-08-09 DIAGNOSIS — J4541 Moderate persistent asthma with (acute) exacerbation: Secondary | ICD-10-CM | POA: Diagnosis not present

## 2019-08-09 DIAGNOSIS — E785 Hyperlipidemia, unspecified: Secondary | ICD-10-CM | POA: Diagnosis not present

## 2019-08-10 DIAGNOSIS — J441 Chronic obstructive pulmonary disease with (acute) exacerbation: Secondary | ICD-10-CM | POA: Diagnosis not present

## 2019-08-10 DIAGNOSIS — Z8616 Personal history of COVID-19: Secondary | ICD-10-CM | POA: Diagnosis not present

## 2019-08-10 DIAGNOSIS — R296 Repeated falls: Secondary | ICD-10-CM | POA: Diagnosis not present

## 2019-08-10 DIAGNOSIS — J4541 Moderate persistent asthma with (acute) exacerbation: Secondary | ICD-10-CM | POA: Diagnosis not present

## 2019-08-10 DIAGNOSIS — S99911A Unspecified injury of right ankle, initial encounter: Secondary | ICD-10-CM | POA: Diagnosis not present

## 2019-08-10 DIAGNOSIS — Z794 Long term (current) use of insulin: Secondary | ICD-10-CM | POA: Diagnosis not present

## 2019-08-10 DIAGNOSIS — Z87891 Personal history of nicotine dependence: Secondary | ICD-10-CM | POA: Diagnosis not present

## 2019-08-10 DIAGNOSIS — Z87442 Personal history of urinary calculi: Secondary | ICD-10-CM | POA: Diagnosis not present

## 2019-08-10 DIAGNOSIS — S199XXA Unspecified injury of neck, initial encounter: Secondary | ICD-10-CM | POA: Diagnosis not present

## 2019-08-10 DIAGNOSIS — Z20822 Contact with and (suspected) exposure to covid-19: Secondary | ICD-10-CM | POA: Diagnosis not present

## 2019-08-10 DIAGNOSIS — S299XXA Unspecified injury of thorax, initial encounter: Secondary | ICD-10-CM | POA: Diagnosis not present

## 2019-08-10 DIAGNOSIS — I10 Essential (primary) hypertension: Secondary | ICD-10-CM | POA: Diagnosis not present

## 2019-08-10 DIAGNOSIS — S0990XA Unspecified injury of head, initial encounter: Secondary | ICD-10-CM | POA: Diagnosis not present

## 2019-08-10 DIAGNOSIS — E785 Hyperlipidemia, unspecified: Secondary | ICD-10-CM | POA: Diagnosis not present

## 2019-08-10 DIAGNOSIS — M25551 Pain in right hip: Secondary | ICD-10-CM | POA: Diagnosis not present

## 2019-08-10 DIAGNOSIS — E1165 Type 2 diabetes mellitus with hyperglycemia: Secondary | ICD-10-CM | POA: Diagnosis not present

## 2019-08-10 DIAGNOSIS — R197 Diarrhea, unspecified: Secondary | ICD-10-CM | POA: Diagnosis not present

## 2019-08-10 DIAGNOSIS — Z7982 Long term (current) use of aspirin: Secondary | ICD-10-CM | POA: Diagnosis not present

## 2019-08-10 DIAGNOSIS — A419 Sepsis, unspecified organism: Secondary | ICD-10-CM | POA: Diagnosis not present

## 2019-08-10 DIAGNOSIS — R2242 Localized swelling, mass and lump, left lower limb: Secondary | ICD-10-CM | POA: Diagnosis not present

## 2019-08-10 DIAGNOSIS — S3993XA Unspecified injury of pelvis, initial encounter: Secondary | ICD-10-CM | POA: Diagnosis not present

## 2019-08-10 DIAGNOSIS — E871 Hypo-osmolality and hyponatremia: Secondary | ICD-10-CM | POA: Diagnosis not present

## 2019-08-26 ENCOUNTER — Other Ambulatory Visit: Payer: Self-pay | Admitting: *Deleted

## 2019-08-26 NOTE — Patient Outreach (Addendum)
[facility]) Care Management  08/26/2019  Valerie Wagner Aug 24, 1950 [id number]   Unsuccessful [facility] outreach to MD referred patient  Referral Date:08/22/19 Referral Source: Dr Donzetta Sprung office, [name], LPN Referral Reason: Medication noncompliance, complicated medication regimen, unable to reach patient Has been unable to reach her since then She missed her TCM visit with [name]  Insurance: united healthcare medicare    Outreach attempt # 1 No answer at [phone] [facility] RN CM unable to leave a HIPAA confidential message  No answer at [phone] Listed in care everywhere [employer]vant health information also in Epic as [place] number [facility] RN CM unable to leave a HIPAA confidential message  No answer [phone] Listed in care everywhere [facility] medical center [facility]) health information [facility] RN CM unable to leave a HIPAA confidential message   [phone] Listed in Epic as her son, Sharia Reeve number, female answered and disconnected  [facility] RN CM unable to leave a HIPAA confidential message  [phone]  [facility] RN CM called again female answered and confirm this was an incorrect number for [employer] "Yes ma'am, you have the wrong number" [facility] RN CM thanked her  [facility] RN CM unable to leave a HIPAA confidential message   PING feed indicates [facility] , [place] admission 08/09/19-08/10/19 for sepsis, COPD with exacerbation, covid exposure    Plan: [facility] RN CM sent an unsuccessful outreach letter to the address listed in Epic as [address] [place] [address] and scheduled this patient for another call attempt within 4 business days   [facility] RN CM will follow [facility] call attempt workflow   Routed note to MD   [name], RN, BSN, CCM [facility] Telephonic Care Management Care Coordinator Office number [phone] Mobile number [phone]  Main THN number [phone] Fax number [phone]

## 2019-08-27 ENCOUNTER — Other Ambulatory Visit: Payer: Self-pay | Admitting: *Deleted

## 2019-08-27 NOTE — Patient Outreach (Addendum)
  [facility]) Care Management  08/27/2019  Valerie Wagner Mar 15, 1951 [id number]   Unsuccessful [facility] outreach to MD referred patient  Referral Date:08/22/19 Referral Source: Dr Donzetta Sprung office, Carlynn Herald, LPN Referral Reason: Medication noncompliance, complicated medication regimen, unable to reach patient This patient was discharged from [facility] on 08/16/19 and nobody has been able to reach her since then.  She missed her TCM visit with Dr. Reuel Boom. Insurance: united healthcare medicare  Medicaid [employer] attempt # 2 No answer at [phone] [facility] RN CM unable to leave a HIPAA confidential message  No answer at [phone] Listed in care everywhere novant health information also in Epic as spouse number [facility] RN CM unable to leave a HIPAA confidential message  No answer at [phone] Listed in care everywhere novant health information also in Epic in [place] for [facility] DSNP outreach number [facility] RN CM unable to leave a HIPAA confidential message    [facility] RN CM called [name], primary care provider (PCP) LPN [phone] ext [phone] to see if she has heard from patient or have any contact numbers for her. Amy confirms no other numbers are available for the patient Discussed with Amy the unsuccessful out reaches to [name] and [facility] call attempt workflow process  Discussed an unsuccessful letter has been sent on 08/26/19   Plan: [facility] RN CM sent an unsuccessful outreach letter to the address listed in Epic as [address] [place] [address] on 08/26/19, left messages on 08/26/19 and 08/27/19  [facility] RN CM scheduled this patient for a final call attempt within 4-7 business days  [facility] RN CM will follow [facility] call attempt workflow   Routed note to MD   [name], RN, BSN, CCM [facility] Telephonic Care Management Care Coordinator Office number [phone] Mobile number [phone]  Main [facility] number [phone] Fax number [phone]

## 2019-09-02 ENCOUNTER — Other Ambulatory Visit: Payer: Self-pay | Admitting: *Deleted

## 2019-09-02 NOTE — Patient Outreach (Signed)
[facility]) Care Management  09/02/2019  Valerie Wagner 01/16/1951 [id number]   Unsuccessful THN outreach to MD referred patient  Referral Date:08/22/19 Referral Source:Dr Donzetta Sprung office, [name], LPN Referral Reason:Medication noncompliance, complicated medication regimen, unable to reach patient This patient was discharged from [facility] on 08/16/19 and nobody has been able to reach her since then.  She missed her TCM visit with Dr. Reuel Boom. Insurance:united healthcare medicare Medicaid [employer] attempt # 3 No answerat [phone] TH[facility] RN CM unable to leave a HIPAA confidential message  No answer at [phone] Listed for her spouse   [facility] RN CM updated  Carlynn Herald, primary care provider (PCP) LPN [phone] on 08/27/19 Discussed with Amy the unsuccessful out reaches to [name] and [facility] call attempt workflow process  Discussed an unsuccessful letter has been sent on 08/26/19  Plan: [facility] RN CM sent an unsuccessful outreach letterto the address listed in Epic as [address] [place] [address]IO 08/26/19, left messages on 08/26/19, 08/27/19 and 09/02/18 [facility] RN CM scheduled this patient for case closure per [facility] call attempt workflow if no response to [facility] unsuccessful outreach letter within the 10 business day time frame (09/09/19)  Consulted [facility] PAC coordinator  Routed note to MD  [name], RN, BSN, CCM [facility] Telephonic Care Management Care Coordinator Office number [phone] Mobile number [phone]  Main [facility]N number [phone] Fax number [phone]

## 2019-09-13 ENCOUNTER — Other Ambulatory Visit: Payer: Self-pay | Admitting: *Deleted

## 2019-09-13 NOTE — Patient Outreach (Addendum)
[facility]) Care Management  09/13/2019  Valerie Wagner 1950-07-31 [id number]   [facility] Care coordination No response from patient, collaboration with primary care provider (PCP) staff  No response from patient [facility] RN CM rechecked [facility] and Care everywhere for possible new numbers, admissions, ED visits, etc  Call to MD office Dr Sheila Oats with Valerie Wagner in Amy,Wagner absence  Updated about no response from patient Reviewed and compared demographics for pt MD office has an address of [address] [place] [address] This address was not on file in Epic  Updated address in Epic   Left a message for Valerie Wagner with Valerie Wagner that [facility] RN CM will send an unsuccessful outreach letter to new address and pend for the [facility] call attempt  Workflow recommended days - pending case closure if no response in allotted time     Plan: [facility] RN CM sent an unsuccessful outreach letter to new address and pend for the [facility] call attempt  Workflow recommended days - pending case closure if no response in allotted time  Routed note to MD  Valerie Bradford L. Noelle Penner, RN, BSN, CCM [facility] Telephonic Care Management Care Coordinator Office number [phone] Mobile number [phone]  Main THN number [phone] Fax number [phone]

## 2019-09-27 ENCOUNTER — Other Ambulatory Visit: Payer: Self-pay | Admitting: *Deleted

## 2019-09-27 NOTE — Patient Outreach (Signed)
[facility]) Care Management  09/27/2019  Valerie Wagner 30-Jul-1950 [id number]   Case closure -unable to reach  [facility] RN CM received a referral  On 08/22/19 for medication noncompliance, complicated medication regimen, unable to reach patient. Has been unable to reach her since she missed her TCM visit with [name]  Call attempts made on 08/26/19, 08/27/19, 09/02/19 and 09/13/19  Unsuccessful outreach letters sent on 08/26/19 and 09/13/19 without a response   Plan [facility] RN CM will close case after no response from patient within 10+ business days. Unable to reach Case closure letters sent to MD Routed note to MD  [name], RN, BSN, CCM [facility] Telephonic Care Management Care Coordinator Office number [phone] Mobile number [phone]  Main THN number [phone] Fax number [phone]

## 2019-11-19 DIAGNOSIS — R4582 Worries: Secondary | ICD-10-CM | POA: Diagnosis not present

## 2019-11-19 DIAGNOSIS — E782 Mixed hyperlipidemia: Secondary | ICD-10-CM | POA: Diagnosis not present

## 2019-11-19 DIAGNOSIS — Z0001 Encounter for general adult medical examination with abnormal findings: Secondary | ICD-10-CM | POA: Diagnosis not present

## 2019-11-19 DIAGNOSIS — G4733 Obstructive sleep apnea (adult) (pediatric): Secondary | ICD-10-CM | POA: Diagnosis not present

## 2019-11-19 DIAGNOSIS — E1143 Type 2 diabetes mellitus with diabetic autonomic (poly)neuropathy: Secondary | ICD-10-CM | POA: Diagnosis not present

## 2019-11-19 DIAGNOSIS — I10 Essential (primary) hypertension: Secondary | ICD-10-CM | POA: Diagnosis not present

## 2019-11-19 DIAGNOSIS — M545 Low back pain: Secondary | ICD-10-CM | POA: Diagnosis not present

## 2019-12-06 ENCOUNTER — Other Ambulatory Visit: Payer: Self-pay | Admitting: *Deleted

## 2019-12-06 NOTE — Patient Outreach (Signed)
[facility]) Care Management  12/06/2019  Valerie Wagner [birth year] [id number]   [facility] Telephone Assessment/Screen for MDreferral  Referral Date: 11/20/19 Referral Source: MD referral from [facility], LPN at Dr Donzetta Sprung office  Referral Reason: Frequent falls, DM, Severe Pain, obesity, homebound, disabled, lives alone Insurance: united healthcare medicare ? Secondary Medicaid  THN RN CM will unsuccessful attempts to reach this patient with a March 2021 referral from MD  MD referral does not provide an outreach number for patient in referral   Unsuccessful Outreach attempt # 1 to [phone] listed in Epic  No answer.  An automated voice message was received x 2 "The person is unavailable right now, message 2 switch 185" X 2   [facility] RN CM was not able to leave a HIPAA [employer] Portability and Accountability Act) compliant voicemail message along with CM's contact info.    Plan: [facility] RN CM sent an unsuccessful outreach letter and scheduled this patient for another call attempt within 4-7 business days   Linda Grimmer L. Noelle Penner, RN, BSN, CCM [facility] Telephonic Care Management Care Coordinator Office number [phone] Mobile number [phone]  Main THN number [phone] Fax number [phone]

## 2019-12-11 ENCOUNTER — Other Ambulatory Visit: Payer: Self-pay | Admitting: *Deleted

## 2019-12-11 NOTE — Patient Outreach (Addendum)
[facility]) Care Management  12/11/2019  Adelfa Lozito 13-Sep-1950 [id number]   [facility] Care coordination -collaboration with pcp office   [facility] RN CM called [name] office  Spoke with [place] to check for other possible outreach numbers for this patient [facility] RN CM was offered a mobile number of [phone] This was entered in Epic [facility] RN CM left a message for [place], LPN that [facility] RN CM has not been successful in outreach to patient at this time and will try further outreach to patient using the number provided   Plan [facility] RN CM scheduled this patient for another call attempt within 4-7 business days  [facility] RN CM sent an unsuccessful outreach letter on 12/06/19 As of 12/11/19 no response from pt  [place] L. Noelle Penner, RN, BSN, CCM [facility] Telephonic Care Management Care Coordinator Office number [phone] Mobile number [phone]  Main [facility] number [phone] Fax number [phone]

## 2019-12-11 NOTE — Patient Outreach (Signed)
[facility]) Care Management  12/11/2019  Valerie Wagner 1950-10-08 [id number]   Encounter opened in error  [name], RN, BSN, CCM [facility] Telephonic Care Management Care Coordinator Office number [phone] Mobile number [phone]  Main [facility] number [phone] Fax number [phone]

## 2019-12-13 ENCOUNTER — Other Ambulatory Visit: Payer: Self-pay | Admitting: *Deleted

## 2019-12-13 NOTE — Patient Outreach (Signed)
[facility]) Care Management  12/13/2019  Josslin Sanjuan Jan 17, 1951 [id number]   [facility] Telephone Assessment/Screen for MDreferral  Referral Date: 11/20/19 Referral Source: [facility], LPN at Dr Donzetta Sprung office  Referral Reason: Frequent falls, DM, Severe Pain, obesity, homebound, disabled, lives alone Insurance: united healthcare medicare ? Secondary Medicaid  [facility] RN CM made unsuccessful attempts to reach this patient with a March 2021 referral from MD   [facility] RN CM dialed the new mobile number [phone] x [phone] provided by the primary care provider (PCP) Office staff member [place] on 12/11/19    this is a number for [employer] to check bank statements  [facility] RN CM called the pcp office updated Tiffany of the outreach attempts to get the pt without success with the number provided.  [facility] RN CM and Tiffany verified the number given on 12/11/19 as correct as [phone] Tiffany looked for other numbers for patient and offered [phone] [facility] RN CM dialed [phone] This number is disconnected also Tiffany updated THN RN CM requested [name] and Amy LPN be updated about the unsuccessful attempts to reach the patient for [facility] telephonic care services and suggested a possible home health referral for Mrs Tsutsui. Discussed [facility] not being a home health agency, no home visit at this time  Discussed pt pending case closure if no response from outreach letter sent  [facility] RN CM left [facility] RN CM office number for a return call prn   [facility] RN CM scheduled this patient for another call attempt within 4-7business days  [facility] RN CM sent an unsuccessful outreach letter on 12/06/19 As of 12/13/19 no response from pt  [place] L. Noelle Penner, RN, BSN, CCM [facility] Telephonic Care Management Care Coordinator Office number [phone] Mobile number [phone]  Main [facility] number [phone] Fax number [phone]

## 2019-12-13 NOTE — Patient Outreach (Signed)
[facility]) Care Management  12/13/2019  Shannah Conteh 03-10-1951 [id number]   [facility] Care coordination- collaboration with pcp staff  Message from [place] at [place] [name] office Returned a call to her [name] updated [facility] RN CM that the office has not been able to reach Mrs Cornell via telephone and states [name] inquires about home health visit [facility] RN CM discussed with [name] that [facility] is not a home health agency, primarily telephonic, no present home visit and discusses an order to be sent to a local home health agency for services, possible wellcare visit [name] voiced understanding  Plan [facility] RN CMscheduled this patient for a final call attempt within 4-7business days  [facility] RN CM sent an unsuccessful outreach letteron 12/06/19 As of 12/13/19 no response from pt  Argyle L. Noelle Penner, RN, BSN, CCM [facility] Telephonic Care Management Care Coordinator Office number [phone] Mobile number [phone]  Main THN number [phone] Fax number [phone]

## 2019-12-16 ENCOUNTER — Other Ambulatory Visit: Payer: Self-pay | Admitting: *Deleted
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
## 2019-12-16 NOTE — Patient Outreach (Signed)
[facility]) Care Management  12/16/2019  Valerie Wagner Aug 26, 1950 [id number]   Final outreach attempt for [facility] Telephone Assessment/Screen for MDreferral  Referral Date:11/20/19 Referral Source:MD referral from [facility] LPN at Dr Donzetta Sprung office Referral Reason:Frequent falls, DM, Severe Pain, obesity, homebound, disabled, lives alone Insurance:united healthcare medicare? Secondary Medicaid  THN RN CM made unsuccessful attempts to reach this patient with a March 2021 referral from MD    [facility] RN CM dialed [phone] no answer are availability to leave a voice message, an automated voice message states the patient is not available [facility] RN CM dialed the mobile number [phone]  this is a number for [employer] to check bank statements  THN RN CM dialed [phone] This number is disconnected also  [facility] RN CMscheduled this patient for  Case closure within th next 21  business days if no response from patient  [facility] RN CM sent an unsuccessful outreach letteron 12/06/19 As of 12/16/19 no response from pt  [place] L. Noelle Penner, RN, BSN, CCM [facility] Telephonic Care Management Care Coordinator Office number [phone] Mobile number [phone]  Main THN number [phone] Fax number [phone]

## 2020-01-06 ENCOUNTER — Other Ambulatory Visit: Payer: Self-pay | Admitting: *Deleted
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
## 2020-01-10 DIAGNOSIS — R55 Syncope and collapse: Secondary | ICD-10-CM | POA: Diagnosis not present

## 2020-01-10 DIAGNOSIS — R4020 Unspecified coma: Secondary | ICD-10-CM | POA: Diagnosis not present

## 2020-01-10 DIAGNOSIS — E1165 Type 2 diabetes mellitus with hyperglycemia: Secondary | ICD-10-CM | POA: Diagnosis not present

## 2020-02-26 DIAGNOSIS — J449 Chronic obstructive pulmonary disease, unspecified: Secondary | ICD-10-CM | POA: Diagnosis not present

## 2020-02-26 DIAGNOSIS — R4582 Worries: Secondary | ICD-10-CM | POA: Diagnosis not present

## 2020-02-26 DIAGNOSIS — E782 Mixed hyperlipidemia: Secondary | ICD-10-CM | POA: Diagnosis not present

## 2020-02-26 DIAGNOSIS — Z23 Encounter for immunization: Secondary | ICD-10-CM | POA: Diagnosis not present

## 2020-02-26 DIAGNOSIS — G4733 Obstructive sleep apnea (adult) (pediatric): Secondary | ICD-10-CM | POA: Diagnosis not present

## 2020-02-26 DIAGNOSIS — I10 Essential (primary) hypertension: Secondary | ICD-10-CM | POA: Diagnosis not present

## 2020-02-26 DIAGNOSIS — E1143 Type 2 diabetes mellitus with diabetic autonomic (poly)neuropathy: Secondary | ICD-10-CM | POA: Diagnosis not present

## 2020-02-27 DIAGNOSIS — E1143 Type 2 diabetes mellitus with diabetic autonomic (poly)neuropathy: Secondary | ICD-10-CM | POA: Diagnosis not present

## 2020-05-29 DIAGNOSIS — R4582 Worries: Secondary | ICD-10-CM | POA: Diagnosis not present

## 2020-05-29 DIAGNOSIS — E1143 Type 2 diabetes mellitus with diabetic autonomic (poly)neuropathy: Secondary | ICD-10-CM | POA: Diagnosis not present

## 2020-05-29 DIAGNOSIS — E782 Mixed hyperlipidemia: Secondary | ICD-10-CM | POA: Diagnosis not present

## 2020-05-29 DIAGNOSIS — J449 Chronic obstructive pulmonary disease, unspecified: Secondary | ICD-10-CM | POA: Diagnosis not present

## 2020-05-29 DIAGNOSIS — Z23 Encounter for immunization: Secondary | ICD-10-CM | POA: Diagnosis not present

## 2020-05-29 DIAGNOSIS — I10 Essential (primary) hypertension: Secondary | ICD-10-CM | POA: Diagnosis not present

## 2020-05-29 DIAGNOSIS — G4733 Obstructive sleep apnea (adult) (pediatric): Secondary | ICD-10-CM | POA: Diagnosis not present

## 2020-06-03 DIAGNOSIS — Z23 Encounter for immunization: Secondary | ICD-10-CM | POA: Diagnosis not present

## 2020-06-04 ENCOUNTER — Other Ambulatory Visit: Payer: Self-pay | Admitting: *Deleted

## 2020-06-04 NOTE — Patient Outreach (Signed)
[facility]) Care Management  06/04/2020  Lindaann Gradilla [birth year] [id number]   [facility] Telephone Assessment/Screen for MD, referral  Referral Date:05/29/20 Referral Source: MD Dr Donzetta Sprung Referral Reason: on APL  Referral note: High risk, poor social support, uncontrolled DM, non-compliant.  MD also gave pt [facility] RN CM's number  Insurance:united healthcare medicare     Outreach attempt # 1  [facility] Unsuccessful outreach   Outreach attempt to the home number  No answer. THN RN CM left HIPAA [facility] Portability and Accountability Act) compliant voicemail message along with CMs contact info.   Plan: [facility] RN CM scheduled this patient for another call attempt within 4-7 business days   Korbin Notaro L. Noelle Penner, RN, BSN, CCM [facility] Telephonic Care Management Care Coordinator Office number [phone] Mobile number [phone]  Main THN number [phone] Fax number [phone]

## 2020-06-09 ENCOUNTER — Other Ambulatory Visit: Payer: Self-pay | Admitting: *Deleted

## 2020-06-09 NOTE — Patient Outreach (Signed)
[facility]) Care Management  06/09/2020  Calea Hribar [birth year] [id number]   [facility] second unsuccessful call attempt for Telephone Assessment/Screen for MD referral  Referral Date:05/29/20 Referral Source: MD Dr Donzetta Sprung Referral Reason: on APL  Referral note: High risk, poor social support, uncontrolled DM, non-compliant.  MD also gave pt [facility] RN CM's number per referral  Insurance:united healthcare medicare   [facility] Unsuccessful outreach   Second Outreach attempt to the home number [phone] Telephone rings and then busy signal is heard  No answer.   Plan: [facility] RN CM scheduled this patient for a third  call attempt within 4-7 business days Unsuccessful outreaches on 06/04/20 and 06/09/20  Unsuccessful letter sent to patient on 06/04/20 Routed note to MD  [name], RN, BSN, CCM [facility] Telephonic Care Management Care Coordinator Office number [phone] Mobile number [phone]  Main THN number [phone] Fax number [phone]

## 2020-06-18 ENCOUNTER — Other Ambulatory Visit: Payer: Self-pay | Admitting: *Deleted

## 2020-06-18 NOTE — Patient Outreach (Signed)
[facility]) Care Management  06/18/2020  Valerie Wagner 05-Dec-1950 [id number]   [facility] third unsuccessful call attempt for Telephone Assessment/Screen for MD referral  Referral Date:05/29/20 Referral Source:MD Dr Donzetta Sprung Referral Reason:on APL  Referral note: High risk, poor social support, uncontrolled DM, non-compliant.  MD also gave pt [facility] RN CM's number per referral  Insurance:united healthcare medicare  [facility] Unsuccessful outreach   Second Outreach attempt to the home number [phone] Telephone rings and then busy signal is heard  No answer.   Outreach to pcp office [phone] spoke with Melissa to have a message sent to RN [name] indicating the patient has not outreached to [facility] RN CM nor has [facility] RN CM been able to reach her at the number or address listed in Epic  [facility] RN CM reviewed with Melissa the  number or address listed in Epic The number and address per Efraim Kaufmann is incorrect  The correct information was provided and updated in Epic as Address [address] [place] [address] Numbers [phone] and [phone]   [phone] has been changed or not in service   At [phone] female stated pt was not there at the moment Agreed to allow [facility] RN CM to return a call to leave a voice message  [facility] RN CM returned a call and No answer. THN RN CM left HIPAA [facility] Portability and Accountability Act) compliant voicemail message along with CM's contact info.   Plan: [facility] RN CM scheduled this patient for further outreach attempts using the new demographic information especially number [phone] Unsuccessful outreaches on 06/04/20, 06/09/20 and 06/18/20 x 2 Unsuccessful letter sent to patient on 06/04/20 and resent to new address on 06/18/20    Valerie Wagner L. Valerie Penner, RN, BSN, CCM [facility] Telephonic Care Management Care Coordinator Office number [phone] Mobile number [phone]  Main THN number [phone] Fax number  [phone]

## 2020-06-22 ENCOUNTER — Other Ambulatory Visit: Payer: Self-pay | Admitting: *Deleted

## 2020-06-22 ENCOUNTER — Other Ambulatory Visit: Payer: Self-pay
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
## 2020-06-24 NOTE — Patient Outreach (Signed)
[facility]) Care Management  06/24/2020  Valerie Wagner 13-Aug-1950 [id number]  Referral for medication assistance sent to [employer] per Edd Arbour, RN request.  Baruch Gouty [facility] Management Assistant [phone]

## 2020-07-06 ENCOUNTER — Other Ambulatory Visit: Payer: Self-pay | Admitting: *Deleted

## 2020-07-06 NOTE — Patient Outreach (Signed)
[facility]) Care Management  07/06/2020  Valerie Wagner 09-18-1950 [id number]   CSW attempted outreach call to pt and was unable to reach. CSW left a HIPPA compliant voice message and will send pt an unsuccessful outreach letter.  CSW will await callback or try again in 3-4 business days .  [name], MSW, LCSW Clinical Social Worker  [facility] [employer] [phone]

## 2020-07-08 ENCOUNTER — Other Ambulatory Visit: Payer: Self-pay | Admitting: *Deleted

## 2020-07-08 NOTE — Patient Outreach (Signed)
[facility]) Care Management  07/08/2020  Valerie Wagner Jul 13, 1950 [id number]   [facility] Unsuccessful outreach for complex care patient (MD referral)  Referral Date:05/29/20 Referral Source:MD Dr Donzetta Sprung Referral Reason:on APL  Referral note: High risk, poor social support, uncontrolled DM, non-compliant.  MD also gave pt [name] RN CM's numberper referral  Insurance:united healthcare medicare, medicaid No admissions in the last 6 months   Last successful outreach on 06/22/20 Noted [name] unsuccessful call attempt to patient on 07/06/20   Outreach attempt at [phone] listed as the preferred number  Her sister in law [name] answered. [name] reports [name] was not there but would give her a message [facility] RN CM left HIPAA [employer] Portability and Accountability Act) compliant voicemail message along with CM's contact info.   Plan: [facility] RN CM scheduled this patient for another call attempt within 4-7 business days  Arian Murley L. Noelle Penner, RN, BSN, CCM [facility] Telephonic Care Management Care Coordinator Office number [phone] Mobile number [phone]  Main THN number [phone] Fax number [phone]

## 2020-07-14 ENCOUNTER — Other Ambulatory Visit: Payer: Medicare Other | Admitting: *Deleted

## 2020-07-14 ENCOUNTER — Other Ambulatory Visit: Payer: Self-pay | Admitting: *Deleted
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
## 2020-07-15 ENCOUNTER — Encounter: Payer: Self-pay | Admitting: *Deleted

## 2020-07-15 ENCOUNTER — Other Ambulatory Visit: Payer: Self-pay

## 2020-07-15 ENCOUNTER — Other Ambulatory Visit: Payer: Self-pay | Admitting: *Deleted
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
## 2020-07-18 DIAGNOSIS — E782 Mixed hyperlipidemia: Secondary | ICD-10-CM | POA: Diagnosis not present

## 2020-07-18 DIAGNOSIS — E1143 Type 2 diabetes mellitus with diabetic autonomic (poly)neuropathy: Secondary | ICD-10-CM | POA: Diagnosis not present

## 2020-07-18 DIAGNOSIS — I10 Essential (primary) hypertension: Secondary | ICD-10-CM | POA: Diagnosis not present

## 2020-07-20 ENCOUNTER — Other Ambulatory Visit: Payer: Self-pay | Admitting: *Deleted

## 2020-07-20 NOTE — Patient Outreach (Signed)
[facility]) Care Management  07/20/2020  Valerie Wagner [birth year] [id number]   [facility] Unsuccessful outreach  Valerie Wagner was referred to [facility] by her primary care provider (PCP) office (Dr Donzetta Sprung) on 05/29/20 for high risk, poor social support, uncontrolled DM and non compliant. MD also gave [facility] RN CM's number per referral Insurance:united healthcare medicare, medicaid No admissions in the last 6 months  Barrier with Valerie Steig has been various unsuccessful outreaches and patient not returning calls to [facility] staff with outreach messages and letters sent.  Homelessness with frequent changes of residency   Outreach attempt to the home number  No answer. [facility] RN CM was not able to leave a message as the voice mail box is full HIPAA compliant Text message sent to pt via work mobile number   Plan: [facility] RN CM scheduled this patient for another call attempt within 4-7 business days  Harwood Nall L. Noelle Penner, RN, BSN, CCM [facility] Telephonic Care Management Care Coordinator Office number [phone] Mobile number [phone]  Main [facility]N number [phone] Fax number [phone]

## 2020-07-23 ENCOUNTER — Other Ambulatory Visit: Payer: Self-pay | Admitting: *Deleted

## 2020-07-23 NOTE — Patient Outreach (Signed)
[facility]) Care Management  07/23/2020  Valerie Wagner [birth year] [id number]  CSW received a call from pt indicating things are going well. She remains at the motel/shelter being offered here in [place] and is most appreciative of this. She is aware of plans to have them moved out by the end of March and she is pursuing options in [place] and [place] and is reminded to talk with the local [facility] team for their further assistance and support in finding options.  Pt also reminded about the [facility] resources and assistance with vouchers for local stores and items to offer.  CSW plans to sign off as her housing issues have been resolved.  CSW has advised pt to call if needs arise, to share updates and for guidance if needed.   CSW will also advise PCP and THN of above.   [name], MSW, LCSW Clinical Social Worker  [facility] [employer] [phone]'

## 2020-07-29 ENCOUNTER — Other Ambulatory Visit: Payer: Self-pay | Admitting: *Deleted

## 2020-07-29 ENCOUNTER — Encounter: Payer: Self-pay | Admitting: *Deleted

## 2020-07-29 ENCOUNTER — Other Ambulatory Visit: Payer: Self-pay

## 2020-07-29 DIAGNOSIS — G629 Polyneuropathy, unspecified: Secondary | ICD-10-CM

## 2020-07-29 DIAGNOSIS — Z794 Long term (current) use of insulin: Secondary | ICD-10-CM

## 2020-07-29 DIAGNOSIS — I10 Essential (primary) hypertension: Secondary | ICD-10-CM

## 2020-07-29 DIAGNOSIS — E119 Type 2 diabetes mellitus without complications: Secondary | ICD-10-CM

## 2020-07-29 DIAGNOSIS — G9340 Encephalopathy, unspecified: Secondary | ICD-10-CM

## 2020-07-29 DIAGNOSIS — E782 Mixed hyperlipidemia: Secondary | ICD-10-CM
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
## 2020-07-29 NOTE — Patient Outreach (Signed)
[facility]) Care Management  07/29/2020  Terrah Decoster [birth year] [id number]   [facility] outreach attempts to complex care patient  Mrs Levi Crass was referred to [facility] by her primary care provider (PCP) office (Dr Donzetta Sprung) on 05/29/20 for high risk, poor social support, uncontrolled DM and non compliant. MD also gave pat [facility] RN CM's number per referral Insurance:united healthcare medicare, medicaid No admissions in the last 6 months  Barrier with Mrs Deacon has been various unsuccessful outreaches and patient not returning calls to [facility] staff with outreach messages and letters sent.  Homelessness with frequent changes of residency   Outreach attempt to [phone] No answer. [facility] RN CM was not able to leave a message as the voice mail box is full HIPAA compliant Text message sent to pt via work mobile number x 3  Left text message  Plan: [facility] RN CM scheduled this patient for another call attempt within 4-7 business days  Klinton Candelas L. Noelle Penner, RN, BSN, CCM [facility] Telephonic Care Management Care Coordinator Office number [phone] Mobile number [phone]  Main [facility] number [phone] Fax number [phone]

## 2020-07-29 NOTE — Addendum Note (Signed)
Addended by: Clinton Gallant on: 07/29/2020 05:47 PM   Modules accepted: Orders

## 2020-07-31 ENCOUNTER — Other Ambulatory Visit: Payer: Self-pay | Admitting: *Deleted
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
## 2020-07-31 NOTE — Patient Outreach (Signed)
[facility]) Care Management  07/31/2020  Valerie Wagner [birth year] [id number]   Opened in error  [name], RN, BSN, CCM [facility] Telephonic Care Management Care Coordinator Office number [phone] Main [facility] number [phone] Fax number [phone]

## 2020-08-04 ENCOUNTER — Telehealth: Payer: Self-pay | Admitting: *Deleted

## 2020-08-04 ENCOUNTER — Other Ambulatory Visit: Payer: Self-pay | Admitting: *Deleted

## 2020-08-04 DIAGNOSIS — J449 Chronic obstructive pulmonary disease, unspecified: Secondary | ICD-10-CM | POA: Diagnosis not present

## 2020-08-04 DIAGNOSIS — K2100 Gastro-esophageal reflux disease with esophagitis, without bleeding: Secondary | ICD-10-CM | POA: Diagnosis not present

## 2020-08-04 DIAGNOSIS — G4733 Obstructive sleep apnea (adult) (pediatric): Secondary | ICD-10-CM | POA: Diagnosis not present

## 2020-08-04 DIAGNOSIS — I10 Essential (primary) hypertension: Secondary | ICD-10-CM | POA: Diagnosis not present

## 2020-08-04 DIAGNOSIS — E1143 Type 2 diabetes mellitus with diabetic autonomic (poly)neuropathy: Secondary | ICD-10-CM | POA: Diagnosis not present

## 2020-08-04 DIAGNOSIS — E7849 Other hyperlipidemia: Secondary | ICD-10-CM | POA: Diagnosis not present

## 2020-08-04 NOTE — Patient Outreach (Signed)
[facility]) Care Management  08/04/2020  Laneka Mcgrory 1950/12/19 [id number]   CSW made contact with pt on 08/03/2020 who confirmed her identity.CSW is familiar with pt from previous involvement this year in helping pt to gain housing and support for long term housing options.  Pt reports she remains at the [facility] where she is being assisted to prevent homelessness.  Pt also reports she has several applications to complete for apartments she is pursuing; in the [place], [place] area so to be close to her family again.  Pt has also had some challenges with her Zenaida Niece and is aware of transportation assistance through [place] as well as through [facility]  A Care Guide can assist pt with these needs if they arise; pt denies any current transportation needs for MD.  "I am having a virtual appointment this week".  CSW offered support and encouragement to pt; stressing to pt to work on her housing plans and to follow up with [facility] staff for other options.  Pt would like to get some fresh fruit and is not able to walk to bus line- Care Guide may be able to offer some assistance with this also.   CSW will close referral for LCSW.  Pt has contact info and is reminded to call if needs arise.   CSW will update PCP and [facility] team to above.   [name], MSW, LCSW Clinical Social Worker  [facility] [employer] [phone]

## 2020-08-04 NOTE — Patient Outreach (Signed)
[facility]) Care Management  08/04/2020  Valerie Wagner 1950-08-03 [id number]  [facility] outreach to [facility] complex care patient [name] was referred to [facility] by herprimary care provider (PCP)office (Dr Donzetta Sprung) on 05/29/20 for high risk, poor social support, uncontrolled DM and non compliant. MD also gave pt [facility] RN CM's number per referral Insurance:united healthcare medicare, medicaid No admissions in the last 6 months  Barrierwith [name] includes various unsuccessful outreaches and patient not returning calls to [facility] staff with outreach messages and letters sent.  Homelessness with frequent changes of residency 07/29/20 Barrier phone issues Pt has poor phone reception at the location she is residing and her phone calls drops after a designated time- poor vision, poor hearing   [facility] Unsuccessful outreach   Outreach attempt to [phone] No answer. Unsuccessful mail box full unable to leave a message  Plan: [facility] RN CM scheduled this patient for another call attempt within 4-7 business days  Whitley Strycharz L. Noelle Penner, RN, BSN, CCM [facility] Telephonic Care Management Care Coordinator Office number [phone] Mobile number [phone]  Main [facility] number [phone] Fax number [phone]

## 2020-08-11 ENCOUNTER — Other Ambulatory Visit: Payer: Self-pay | Admitting: *Deleted

## 2020-08-11 NOTE — Patient Outreach (Addendum)
[facility]) Care Management  08/11/2020  Valerie Wagner May 29, 1951 [id number]   [facility] unsuccessful outreach to [facility] complex care patient Valerie Wagner was referred to [facility] by herprimary care provider (PCP)office (Dr Donzetta Sprung) on 05/29/20 for high risk, poor social support, uncontrolled DM and non compliant. MD also gave [facility]N RN CM's number per referral Insurance:united healthcare medicare, medicaid No admissions in the last 6 months  Barrierwith Valerie Daltonincludesvarious unsuccessful outreaches and patient not returning calls to [facility] staff with outreach messages and letters sent.  Homelessness with frequent changes of residency 07/29/20 Barrier phone issues Pt has poor phone reception at the location she is residing and her phone calls drops after a designated time- poor vision, poor hearing   last unsuccessful outreach attempt on 08/04/20  Outreach attempt to [phone] No answer. [facility] RN CM left HIPAA HIPAA [facility] Portability and Accountability Act) compliant voicemail message along with CM's contact info.   Plan: [facility] RN CM scheduled this patient for another call attempt within 7-10 business days Sent unsuccessful outreach letter on 08/11/20   Valerie Bradford L. Noelle Penner, RN, BSN, CCM [facility] Telephonic Care Management Care Coordinator Office number [phone] Mobile number [phone]  Main [facility] number [phone] Fax number [phone]

## 2020-08-13 ENCOUNTER — Telehealth: Payer: Self-pay | Admitting: Family Medicine

## 2020-08-13 NOTE — Telephone Encounter (Signed)
   Telephone encounter was:  Unsuccessful.  08/13/2020 Name: Suhayla Chisom MRN: [id number] DOB: 1951/05/26  Unsuccessful outbound call made today to assist with:  Housing  Outreach Attempt:  1st Attempt  Could not leave a message due to patient's mailbox being full. Will try to give patient a call within the week.   [facility] Care Guide, Embedded Care Coordination [facility], Care Management Phone: [phone] Email: [email or web address]

## 2020-08-17 DIAGNOSIS — E1143 Type 2 diabetes mellitus with diabetic autonomic (poly)neuropathy: Secondary | ICD-10-CM | POA: Diagnosis not present

## 2020-08-17 DIAGNOSIS — I10 Essential (primary) hypertension: Secondary | ICD-10-CM | POA: Diagnosis not present

## 2020-08-17 DIAGNOSIS — J449 Chronic obstructive pulmonary disease, unspecified: Secondary | ICD-10-CM | POA: Diagnosis not present

## 2020-08-17 DIAGNOSIS — E782 Mixed hyperlipidemia: Secondary | ICD-10-CM | POA: Diagnosis not present

## 2020-08-20 ENCOUNTER — Other Ambulatory Visit: Payer: Self-pay | Admitting: *Deleted

## 2020-08-20 ENCOUNTER — Telehealth: Payer: Self-pay | Admitting: Family Medicine

## 2020-08-20 NOTE — Patient Outreach (Signed)
[facility]) Care Management  08/20/2020  Valerie Wagner [birth year] [id number]   [facility] unsuccessful outreach to [facility] complex care patient Valerie Wagner was referred to [facility] by herprimary care provider (PCP)office (Dr Donzetta Sprung) on 05/29/20 for high risk, poor social support, uncontrolled DM and non compliant. MD also gave ptTHN RN CM's number per referral Insurance:united healthcare medicare, medicaid No admissions in the last 6 months  Barrierwith Valerie Daltonincludesvarious unsuccessful outreaches and patient not returning calls to [facility] staff with outreach messages and letters sent.  Homelessness with frequent changes of residency 07/29/20 Barrier phone issues Pt has poor phone reception at the location she is residing and her phone calls drops after a designated time- poor vision, poor hearing   last unsuccessful outreach attempt on 08/04/20 Pt left a message on 08/19/20 at 0912 reporting she had good news and requesting a return call  Outreach attempt [phone] No answer her phone mailbox is full.  THN RN CM left HIPAA [facility] Portability and Accountability Act) compliant text message along with CM's contact info.   Plan: [facility] RN CM scheduled this patient for another call attempt within 4-7 business days  Valerie Wagner L. Noelle Penner, RN, BSN, CCM [facility] Telephonic Care Management Care Coordinator Office number [phone] Mobile number [phone]  Main THN number [phone] Fax number [phone]

## 2020-08-20 NOTE — Telephone Encounter (Signed)
   Telephone encounter was:  Unsuccessful.  08/20/2020 Name: Valerie Wagner MRN: [id number] DOB: 1951-01-07  Unsuccessful outbound call made today to assist with:  Housing Assistance  Outreach Attempt:  2nd Attempt  Could not leave voicemail due to mailbox being full.   [facility] Care Guide, Embedded Care Coordination [facility], Care Management Phone: [phone] Email: [email or web address]

## 2020-08-21 ENCOUNTER — Ambulatory Visit: Payer: Self-pay | Admitting: *Deleted

## 2020-08-24 ENCOUNTER — Telehealth: Payer: Self-pay | Admitting: Family Medicine

## 2020-08-24 NOTE — Telephone Encounter (Signed)
° °  Telephone encounter was:  Unsuccessful.  08/24/2020 Name: Valerie Wagner MRN: [id number] DOB: 10/07/1950  Unsuccessful outbound call made today to assist with:  Housing Assistance  Outreach Attempt:  3rd Attempt.  Referral closed unable to contact patient.  Could not leave message due to voicemail box being full.   [facility] Care Guide, Embedded Care Coordination [facility], Care Management Phone: [phone] Email: [email or web address]

## 2020-08-26 ENCOUNTER — Other Ambulatory Visit: Payer: Self-pay | Admitting: *Deleted

## 2020-08-26 ENCOUNTER — Encounter: Payer: Self-pay | Admitting: *Deleted
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
## 2020-09-04 ENCOUNTER — Other Ambulatory Visit: Payer: Self-pay | Admitting: *Deleted

## 2020-09-04 NOTE — Patient Outreach (Signed)
[facility]) Care Management  09/04/2020  Valerie Wagner 07-03-1950 [id number]   [facility] Unsuccessful outreach Valerie Wagner was [facility] by herprimary care provider (PCP)office (Dr Donzetta Sprung) on 05/29/20 for high risk, poor social support, uncontrolled DM and non compliant. MD also gave ptTHN RN CM's number per referral Insurance: united healthcare medicare, medicaid No admissions in the last 6 months  Outreach attempt to the home number  No answer. THN RN CM left HIPAA [facility] Portability and Accountability Act) compliant voicemail message along with CM's contact info.   Last successful outreach 08/26/20   Plan: [facility] RN CM scheduled this patient for another call attempt within 4-7 business days  Chinchilla L. Noelle Penner, RN, BSN, CCM [facility] Telephonic Care Management Care Coordinator Office number [phone] Mobile number [phone]  Main THN number [phone] Fax number [phone]

## 2020-09-15 ENCOUNTER — Other Ambulatory Visit: Payer: Self-pay | Admitting: *Deleted

## 2020-09-15 NOTE — Patient Outreach (Signed)
[facility]) Care Management  09/15/2020  Valerie Wagner 01/24/1951 [id number]   [facility] Care coordination- collaboration with pcp RN   Returned a call to pcp medicare nurse after a voice message left Reviewed pt case information and workflow for pending case closure PCP RN will have pt to outreach to [facility] RN CM if pt calls or visits  Plan [facility] RN CM will attempt 3rd call attempt to patient within the next 7-14 business days  Sierra Bissonette L. Noelle Penner, RN, BSN, CCM [facility] Telephonic Care Management Care Coordinator Office number [phone] Mobile number [phone]  Main THN number [phone] Fax number [phone]

## 2020-09-15 NOTE — Patient Outreach (Addendum)
[facility]) Care Management  09/15/2020  Valerie Wagner June 12, 1951 [id number]   [facility] Unsuccessful outreach [name] was [facility] to [facility] by herprimary care provider (PCP)office (Dr Donzetta Sprung) on 05/29/20 for high risk, poor social support, uncontrolled DM and non compliant. MD also gave ptTHN RN CM's number per referral Insurance: united healthcare medicare, medicaid No admissions in the last 6 months  Outreach attempt to [phone]  No answer. Voice mail box is full THN RN CM left HIPAA [facility] Portability and Accountability Act) compliant text message along with CM's contact info.   Last successful outreach 08/26/20  Outreach to pcp with attempt to speak with [name], pcp staff but reached [name], pcp staff [name] was able to assist with new home address (since beginning of March 2022) [address] [place]  [place] [address]  [facility] RN CM will send an unsuccessful outreach letter to patient, pending a return call  Address updated in Epic Pt last interaction with the pcp office is noted per [name] on 09/09/20 when she spoke with pcp pharmacy staff    Plan: [facility] RN CM scheduled this patient for a third call attempt within 4-7 business days to attempt to complete disease management  Goals      Patient Stated   .  [facility]) Find Help in My Community (pt-stated)      Timeframe:  Short-Term Goal Priority:  High Start Date:                   06/22/20          Expected End Date:          10/17/20             Follow Up Date 09/22/20   - follow-up on any referrals for help I am given - have a back-up plan    Notes: 08/26/20 has a new apartment in [place]  07/30/20 pt has not followed up with local authorities, She reached out to [facility] SW only 07/29/20 various calls to attempt to assist with medicaid & [facility] medical transportation- consulted [facility] SW referred to care guide 07/15/20 follow up, safe location, has transportation, belongings & medicine Continuing  to work with [facility] SW 07/14/20 collaboration with [facility] SW & MD RN, [name] r/t pt voiced concerns with pending eviction/verbalized fear of members of present household -pt outreached to [facility] RN CM after leaving PCP office       [name], RN, BSN, CCM [facility] Telephonic Care Management Care Coordinator Office number [phone] Mobile number [phone]  Main THN number [phone] Fax number [phone]

## 2020-09-16 DIAGNOSIS — I10 Essential (primary) hypertension: Secondary | ICD-10-CM | POA: Diagnosis not present

## 2020-09-16 DIAGNOSIS — E782 Mixed hyperlipidemia: Secondary | ICD-10-CM | POA: Diagnosis not present

## 2020-09-22 ENCOUNTER — Other Ambulatory Visit: Payer: Self-pay | Admitting: *Deleted

## 2020-09-22 NOTE — Patient Outreach (Signed)
[facility]) Care Management  09/22/2020  [name] 07-29-1950 [id number]   [facility] Unsuccessful outreach  Mrs Valerie Wagner was referred to [facility] by herprimary care provider (PCP)office (Dr Donzetta Sprung) on 05/29/20 for high risk, poor social support, uncontrolled DM and non compliant. MD also gave ptTHN RN CM's number per referral Insurance: united healthcare medicare, medicaid No admissions in the last 6 months  Outreach attempt to the home number [phone] No answer. Voice mail box full unable to leave a voice message Sent a HIPAA compliant text message   Unsuccessful outreaches on 09/04/20, 09/15/20, 09/22/20 Unsuccessful outreach letter sent to new apartment address on 09/15/20 Spoke with pcp staff about pending case closure if no outreach or response from patient on 09/15/20  Plan: [facility] RN CM placed this patient on hold per call attempts workflow and will pend case closue on scheduled day  Fax note to [employer]  L. Noelle Penner, RN, BSN, CCM [facility] Telephonic Care Management Care Coordinator Office number [phone] Mobile number [phone]  Main THN number [phone] Fax number [phone]

## 2020-09-23 DIAGNOSIS — S79911A Unspecified injury of right hip, initial encounter: Secondary | ICD-10-CM | POA: Diagnosis not present

## 2020-09-23 DIAGNOSIS — M25571 Pain in right ankle and joints of right foot: Secondary | ICD-10-CM | POA: Diagnosis not present

## 2020-09-23 DIAGNOSIS — M7989 Other specified soft tissue disorders: Secondary | ICD-10-CM | POA: Diagnosis not present

## 2020-09-23 DIAGNOSIS — S80211A Abrasion, right knee, initial encounter: Secondary | ICD-10-CM | POA: Diagnosis not present

## 2020-09-23 DIAGNOSIS — R6889 Other general symptoms and signs: Secondary | ICD-10-CM | POA: Diagnosis not present

## 2020-09-23 DIAGNOSIS — M25551 Pain in right hip: Secondary | ICD-10-CM | POA: Diagnosis not present

## 2020-09-23 DIAGNOSIS — M79642 Pain in left hand: Secondary | ICD-10-CM | POA: Diagnosis not present

## 2020-09-23 DIAGNOSIS — Z743 Need for continuous supervision: Secondary | ICD-10-CM | POA: Diagnosis not present

## 2020-09-23 DIAGNOSIS — R609 Edema, unspecified: Secondary | ICD-10-CM | POA: Diagnosis not present

## 2020-09-23 DIAGNOSIS — S6992XA Unspecified injury of left wrist, hand and finger(s), initial encounter: Secondary | ICD-10-CM | POA: Diagnosis not present

## 2020-09-23 DIAGNOSIS — Z880 Allergy status to penicillin: Secondary | ICD-10-CM | POA: Diagnosis not present

## 2020-09-23 DIAGNOSIS — M1711 Unilateral primary osteoarthritis, right knee: Secondary | ICD-10-CM | POA: Diagnosis not present

## 2020-09-23 DIAGNOSIS — S8991XA Unspecified injury of right lower leg, initial encounter: Secondary | ICD-10-CM | POA: Diagnosis not present

## 2020-09-23 DIAGNOSIS — W19XXXA Unspecified fall, initial encounter: Secondary | ICD-10-CM | POA: Diagnosis not present

## 2020-09-23 DIAGNOSIS — W010XXA Fall on same level from slipping, tripping and stumbling without subsequent striking against object, initial encounter: Secondary | ICD-10-CM | POA: Diagnosis not present

## 2020-10-17 DIAGNOSIS — E782 Mixed hyperlipidemia: Secondary | ICD-10-CM | POA: Diagnosis not present

## 2020-10-17 DIAGNOSIS — I10 Essential (primary) hypertension: Secondary | ICD-10-CM | POA: Diagnosis not present

## 2020-10-21 ENCOUNTER — Other Ambulatory Visit: Payer: Self-pay | Admitting: *Deleted

## 2020-10-21 NOTE — Patient Outreach (Addendum)
[facility]) Care Management  10/21/2020  Valerie Wagner 11/19/1950 [id number]   THN Case closure after 4th unsuccessful outreach  Valerie Wagner was referred to [facility] by herprimary care provider (PCP)office (Dr Donzetta Sprung) on 05/29/20 for high risk, poor social support, uncontrolled DM and non compliant. MD also gave ptTHN RN CM's number per referral Insurance: united healthcare medicare, medicaid No admissions in the last 6 months  Noted with review of EPIC chart  An ED visit on 09/23/20 [name] Final diagnoses:  Fall on same level from slipping, tripping or stumbling, initial encounter (Primary)  Right hip pain  Acute pain of right knee  Abrasion of right knee, initial encounter  Acute right ankle pain  Left hand pain   Outreach attempt to the home number [phone] No answer. Message states unable to be reach No opportunity for [facility] RN CM to leave a voice message allowed   [facility] SW case closure on 08/04/20 and unsuccessful call attempts from [facility] care guide to continues services but with case closure on 08/24/20  Unsuccessful outreaches on 09/04/20, 09/15/20, 09/22/20, 10/21/20 Unsuccessful outreach letter sent to new apartment address on 09/15/20 without a response  Spoke with pcp staff about pending case closure if no outreach or response from patient on 09/15/20 Noted possible last pcp office visit in January 2022  Plan [facility] RN CM will close case after no response from patient within 10+ business days per [facility] workflow-unable to maintain contact with patient Case closure letters sent to patient and MD Forwarded note to MD  Goals      Patient Stated   .  [facility]) Find Help in My Community (pt-stated)      Timeframe:  Short-Term Goal Priority:  High Start Date:                   06/22/20          Expected End Date:          10/17/20             Case closure unable to maintain contact with patient   - follow-up on any referrals for help I am  given - have a back-up plan     Notes: 10/21/20 Unsuccessful outreaches on 09/04/20, 09/15/20, 09/22/20, 10/21/20 Unsuccessful outreach letter sent to new apartment address on 09/15/20 without a response  Spoke with pcp staff about pending case closure if no outreach or response from patient on 09/15/20 Noted possible last pcp office visit in January 2022 [facility] SW case closure on 08/04/20 and unsuccessful call attempts from [facility] care guide to continues services but with case closure on 08/24/20  09/15/20 unsuccessful outreach [facility] letter sent 09/04/20 unsuccessful outreach 08/26/20 has a new apartment in [place]  07/30/20 pt has not followed up with local authorities, She reached out to [facility] SW only 07/29/20 various calls to attempt to assist with medicaid & [facility] medical transportation- consulted [facility] SW referred to care guide 07/15/20 follow up, safe location, has transportation, belongings & medicine Continuing to work with [facility] SW 07/14/20 collaboration with [facility] SW & MD RN, [name] r/t pt voiced concerns with pending eviction/verbalized fear of members of present household -pt outreached to [facility] RN CM after leaving PCP office       Valerie Bradford L. Noelle Penner, RN, BSN, CCM [facility] Telephonic Care Management Care Coordinator Office number [phone] Mobile number [phone]  Main THN number [phone] Fax number [phone]

## 2020-10-30 DIAGNOSIS — J449 Chronic obstructive pulmonary disease, unspecified: Secondary | ICD-10-CM | POA: Diagnosis not present

## 2020-11-16 DIAGNOSIS — E1143 Type 2 diabetes mellitus with diabetic autonomic (poly)neuropathy: Secondary | ICD-10-CM | POA: Diagnosis not present

## 2020-11-16 DIAGNOSIS — E782 Mixed hyperlipidemia: Secondary | ICD-10-CM | POA: Diagnosis not present

## 2020-11-16 DIAGNOSIS — I10 Essential (primary) hypertension: Secondary | ICD-10-CM | POA: Diagnosis not present

## 2020-12-13 DIAGNOSIS — R29898 Other symptoms and signs involving the musculoskeletal system: Secondary | ICD-10-CM | POA: Diagnosis not present

## 2020-12-13 DIAGNOSIS — J4540 Moderate persistent asthma, uncomplicated: Secondary | ICD-10-CM | POA: Diagnosis not present

## 2020-12-13 DIAGNOSIS — Z608 Other problems related to social environment: Secondary | ICD-10-CM | POA: Diagnosis not present

## 2020-12-13 DIAGNOSIS — E1122 Type 2 diabetes mellitus with diabetic chronic kidney disease: Secondary | ICD-10-CM | POA: Diagnosis not present

## 2020-12-13 DIAGNOSIS — R4781 Slurred speech: Secondary | ICD-10-CM | POA: Diagnosis not present

## 2020-12-13 DIAGNOSIS — Z91041 Radiographic dye allergy status: Secondary | ICD-10-CM | POA: Diagnosis not present

## 2020-12-13 DIAGNOSIS — I10 Essential (primary) hypertension: Secondary | ICD-10-CM | POA: Diagnosis not present

## 2020-12-13 DIAGNOSIS — I639 Cerebral infarction, unspecified: Secondary | ICD-10-CM | POA: Diagnosis not present

## 2020-12-13 DIAGNOSIS — K219 Gastro-esophageal reflux disease without esophagitis: Secondary | ICD-10-CM | POA: Diagnosis not present

## 2020-12-13 DIAGNOSIS — K119 Disease of salivary gland, unspecified: Secondary | ICD-10-CM | POA: Diagnosis not present

## 2020-12-13 DIAGNOSIS — G459 Transient cerebral ischemic attack, unspecified: Secondary | ICD-10-CM | POA: Diagnosis not present

## 2020-12-13 DIAGNOSIS — Z9119 Patient's noncompliance with other medical treatment and regimen: Secondary | ICD-10-CM | POA: Diagnosis not present

## 2020-12-13 DIAGNOSIS — R29704 NIHSS score 4: Secondary | ICD-10-CM | POA: Diagnosis not present

## 2020-12-13 DIAGNOSIS — R739 Hyperglycemia, unspecified: Secondary | ICD-10-CM | POA: Diagnosis not present

## 2020-12-13 DIAGNOSIS — N181 Chronic kidney disease, stage 1: Secondary | ICD-10-CM | POA: Diagnosis not present

## 2020-12-13 DIAGNOSIS — Z596 Low income: Secondary | ICD-10-CM | POA: Diagnosis not present

## 2020-12-13 DIAGNOSIS — Z20822 Contact with and (suspected) exposure to covid-19: Secondary | ICD-10-CM | POA: Diagnosis not present

## 2020-12-13 DIAGNOSIS — E782 Mixed hyperlipidemia: Secondary | ICD-10-CM | POA: Diagnosis not present

## 2020-12-13 DIAGNOSIS — L304 Erythema intertrigo: Secondary | ICD-10-CM | POA: Diagnosis not present

## 2020-12-13 DIAGNOSIS — Z880 Allergy status to penicillin: Secondary | ICD-10-CM | POA: Diagnosis not present

## 2020-12-13 DIAGNOSIS — Z885 Allergy status to narcotic agent status: Secondary | ICD-10-CM | POA: Diagnosis not present

## 2020-12-13 DIAGNOSIS — R6889 Other general symptoms and signs: Secondary | ICD-10-CM | POA: Diagnosis not present

## 2020-12-13 DIAGNOSIS — I129 Hypertensive chronic kidney disease with stage 1 through stage 4 chronic kidney disease, or unspecified chronic kidney disease: Secondary | ICD-10-CM | POA: Diagnosis not present

## 2020-12-13 DIAGNOSIS — Z743 Need for continuous supervision: Secondary | ICD-10-CM | POA: Diagnosis not present

## 2020-12-13 DIAGNOSIS — Z87891 Personal history of nicotine dependence: Secondary | ICD-10-CM | POA: Diagnosis not present

## 2020-12-13 DIAGNOSIS — R29810 Facial weakness: Secondary | ICD-10-CM | POA: Diagnosis not present

## 2020-12-13 DIAGNOSIS — R531 Weakness: Secondary | ICD-10-CM | POA: Diagnosis not present

## 2020-12-14 DIAGNOSIS — I10 Essential (primary) hypertension: Secondary | ICD-10-CM | POA: Diagnosis not present

## 2020-12-14 DIAGNOSIS — G459 Transient cerebral ischemic attack, unspecified: Secondary | ICD-10-CM | POA: Diagnosis not present

## 2020-12-17 DIAGNOSIS — Z5900 Homelessness unspecified: Secondary | ICD-10-CM | POA: Diagnosis not present

## 2020-12-17 DIAGNOSIS — Z794 Long term (current) use of insulin: Secondary | ICD-10-CM | POA: Diagnosis not present

## 2020-12-17 DIAGNOSIS — J9811 Atelectasis: Secondary | ICD-10-CM | POA: Diagnosis not present

## 2020-12-17 DIAGNOSIS — Z7902 Long term (current) use of antithrombotics/antiplatelets: Secondary | ICD-10-CM | POA: Diagnosis not present

## 2020-12-17 DIAGNOSIS — Z9181 History of falling: Secondary | ICD-10-CM | POA: Diagnosis not present

## 2020-12-17 DIAGNOSIS — Z20822 Contact with and (suspected) exposure to covid-19: Secondary | ICD-10-CM | POA: Diagnosis not present

## 2020-12-17 DIAGNOSIS — J410 Simple chronic bronchitis: Secondary | ICD-10-CM | POA: Diagnosis not present

## 2020-12-17 DIAGNOSIS — K219 Gastro-esophageal reflux disease without esophagitis: Secondary | ICD-10-CM | POA: Diagnosis not present

## 2020-12-17 DIAGNOSIS — R911 Solitary pulmonary nodule: Secondary | ICD-10-CM | POA: Diagnosis not present

## 2020-12-17 DIAGNOSIS — Z7982 Long term (current) use of aspirin: Secondary | ICD-10-CM | POA: Diagnosis not present

## 2020-12-17 DIAGNOSIS — K116 Mucocele of salivary gland: Secondary | ICD-10-CM | POA: Diagnosis not present

## 2020-12-17 DIAGNOSIS — K56609 Unspecified intestinal obstruction, unspecified as to partial versus complete obstruction: Secondary | ICD-10-CM | POA: Diagnosis not present

## 2020-12-17 DIAGNOSIS — G8190 Hemiplegia, unspecified affecting unspecified side: Secondary | ICD-10-CM | POA: Diagnosis not present

## 2020-12-17 DIAGNOSIS — K5900 Constipation, unspecified: Secondary | ICD-10-CM | POA: Diagnosis not present

## 2020-12-17 DIAGNOSIS — E440 Moderate protein-calorie malnutrition: Secondary | ICD-10-CM | POA: Diagnosis not present

## 2020-12-17 DIAGNOSIS — R109 Unspecified abdominal pain: Secondary | ICD-10-CM | POA: Diagnosis not present

## 2020-12-17 DIAGNOSIS — I129 Hypertensive chronic kidney disease with stage 1 through stage 4 chronic kidney disease, or unspecified chronic kidney disease: Secondary | ICD-10-CM | POA: Diagnosis not present

## 2020-12-17 DIAGNOSIS — J4540 Moderate persistent asthma, uncomplicated: Secondary | ICD-10-CM | POA: Diagnosis not present

## 2020-12-17 DIAGNOSIS — R188 Other ascites: Secondary | ICD-10-CM | POA: Diagnosis not present

## 2020-12-17 DIAGNOSIS — R5381 Other malaise: Secondary | ICD-10-CM | POA: Diagnosis not present

## 2020-12-17 DIAGNOSIS — Z8673 Personal history of transient ischemic attack (TIA), and cerebral infarction without residual deficits: Secondary | ICD-10-CM | POA: Diagnosis not present

## 2020-12-17 DIAGNOSIS — I700 Atherosclerosis of aorta: Secondary | ICD-10-CM | POA: Diagnosis not present

## 2020-12-17 DIAGNOSIS — R140 Abdominal distension (gaseous): Secondary | ICD-10-CM | POA: Diagnosis not present

## 2020-12-17 DIAGNOSIS — K5651 Intestinal adhesions [bands], with partial obstruction: Secondary | ICD-10-CM | POA: Diagnosis not present

## 2020-12-17 DIAGNOSIS — K429 Umbilical hernia without obstruction or gangrene: Secondary | ICD-10-CM | POA: Diagnosis not present

## 2020-12-17 DIAGNOSIS — E119 Type 2 diabetes mellitus without complications: Secondary | ICD-10-CM | POA: Diagnosis not present

## 2020-12-17 DIAGNOSIS — R072 Precordial pain: Secondary | ICD-10-CM | POA: Diagnosis not present

## 2020-12-17 DIAGNOSIS — R197 Diarrhea, unspecified: Secondary | ICD-10-CM | POA: Diagnosis not present

## 2020-12-17 DIAGNOSIS — R2689 Other abnormalities of gait and mobility: Secondary | ICD-10-CM | POA: Diagnosis not present

## 2020-12-17 DIAGNOSIS — K560 Paralytic ileus: Secondary | ICD-10-CM | POA: Diagnosis not present

## 2020-12-17 DIAGNOSIS — K56600 Partial intestinal obstruction, unspecified as to cause: Secondary | ICD-10-CM | POA: Diagnosis not present

## 2020-12-17 DIAGNOSIS — M6281 Muscle weakness (generalized): Secondary | ICD-10-CM | POA: Diagnosis not present

## 2020-12-17 DIAGNOSIS — Q8909 Congenital malformations of spleen: Secondary | ICD-10-CM | POA: Diagnosis not present

## 2020-12-17 DIAGNOSIS — R251 Tremor, unspecified: Secondary | ICD-10-CM | POA: Diagnosis not present

## 2020-12-17 DIAGNOSIS — K6389 Other specified diseases of intestine: Secondary | ICD-10-CM | POA: Diagnosis not present

## 2020-12-17 DIAGNOSIS — K3189 Other diseases of stomach and duodenum: Secondary | ICD-10-CM | POA: Diagnosis not present

## 2020-12-17 DIAGNOSIS — E11649 Type 2 diabetes mellitus with hypoglycemia without coma: Secondary | ICD-10-CM | POA: Diagnosis not present

## 2020-12-17 DIAGNOSIS — R531 Weakness: Secondary | ICD-10-CM | POA: Diagnosis not present

## 2020-12-17 DIAGNOSIS — R278 Other lack of coordination: Secondary | ICD-10-CM | POA: Diagnosis not present

## 2020-12-17 DIAGNOSIS — Z87891 Personal history of nicotine dependence: Secondary | ICD-10-CM | POA: Diagnosis not present

## 2020-12-17 DIAGNOSIS — E785 Hyperlipidemia, unspecified: Secondary | ICD-10-CM | POA: Diagnosis not present

## 2020-12-17 DIAGNOSIS — Z7401 Bed confinement status: Secondary | ICD-10-CM | POA: Diagnosis not present

## 2020-12-17 DIAGNOSIS — N390 Urinary tract infection, site not specified: Secondary | ICD-10-CM | POA: Diagnosis not present

## 2020-12-17 DIAGNOSIS — I10 Essential (primary) hypertension: Secondary | ICD-10-CM | POA: Diagnosis not present

## 2020-12-17 DIAGNOSIS — K567 Ileus, unspecified: Secondary | ICD-10-CM | POA: Diagnosis not present

## 2020-12-17 DIAGNOSIS — Z4682 Encounter for fitting and adjustment of non-vascular catheter: Secondary | ICD-10-CM | POA: Diagnosis not present

## 2020-12-17 DIAGNOSIS — E782 Mixed hyperlipidemia: Secondary | ICD-10-CM | POA: Diagnosis not present

## 2020-12-17 DIAGNOSIS — E1165 Type 2 diabetes mellitus with hyperglycemia: Secondary | ICD-10-CM | POA: Diagnosis not present

## 2020-12-17 DIAGNOSIS — K118 Other diseases of salivary glands: Secondary | ICD-10-CM | POA: Diagnosis not present

## 2020-12-17 DIAGNOSIS — R519 Headache, unspecified: Secondary | ICD-10-CM | POA: Diagnosis not present

## 2020-12-17 DIAGNOSIS — Z79899 Other long term (current) drug therapy: Secondary | ICD-10-CM | POA: Diagnosis not present

## 2020-12-17 DIAGNOSIS — N179 Acute kidney failure, unspecified: Secondary | ICD-10-CM | POA: Diagnosis not present

## 2020-12-17 DIAGNOSIS — N181 Chronic kidney disease, stage 1: Secondary | ICD-10-CM | POA: Diagnosis not present

## 2020-12-17 DIAGNOSIS — K5939 Other megacolon: Secondary | ICD-10-CM | POA: Diagnosis not present

## 2020-12-17 DIAGNOSIS — J45998 Other asthma: Secondary | ICD-10-CM | POA: Diagnosis not present

## 2020-12-17 DIAGNOSIS — Z452 Encounter for adjustment and management of vascular access device: Secondary | ICD-10-CM | POA: Diagnosis not present

## 2020-12-17 DIAGNOSIS — Z91041 Radiographic dye allergy status: Secondary | ICD-10-CM | POA: Diagnosis not present

## 2020-12-17 DIAGNOSIS — Z880 Allergy status to penicillin: Secondary | ICD-10-CM | POA: Diagnosis not present

## 2020-12-17 DIAGNOSIS — R262 Difficulty in walking, not elsewhere classified: Secondary | ICD-10-CM | POA: Diagnosis not present

## 2020-12-17 DIAGNOSIS — L304 Erythema intertrigo: Secondary | ICD-10-CM | POA: Diagnosis not present

## 2020-12-17 DIAGNOSIS — E1122 Type 2 diabetes mellitus with diabetic chronic kidney disease: Secondary | ICD-10-CM | POA: Diagnosis not present

## 2020-12-17 DIAGNOSIS — D72829 Elevated white blood cell count, unspecified: Secondary | ICD-10-CM | POA: Diagnosis not present

## 2020-12-17 DIAGNOSIS — Z9049 Acquired absence of other specified parts of digestive tract: Secondary | ICD-10-CM | POA: Diagnosis not present

## 2020-12-17 DIAGNOSIS — R0789 Other chest pain: Secondary | ICD-10-CM | POA: Diagnosis not present

## 2020-12-17 DIAGNOSIS — N281 Cyst of kidney, acquired: Secondary | ICD-10-CM | POA: Diagnosis not present

## 2020-12-25 DIAGNOSIS — Z9049 Acquired absence of other specified parts of digestive tract: Secondary | ICD-10-CM | POA: Diagnosis not present

## 2020-12-25 DIAGNOSIS — R109 Unspecified abdominal pain: Secondary | ICD-10-CM | POA: Diagnosis not present

## 2020-12-25 DIAGNOSIS — R188 Other ascites: Secondary | ICD-10-CM | POA: Diagnosis not present

## 2020-12-25 DIAGNOSIS — K5939 Other megacolon: Secondary | ICD-10-CM | POA: Diagnosis not present

## 2020-12-26 DIAGNOSIS — K3189 Other diseases of stomach and duodenum: Secondary | ICD-10-CM | POA: Diagnosis not present

## 2020-12-26 DIAGNOSIS — N281 Cyst of kidney, acquired: Secondary | ICD-10-CM | POA: Diagnosis not present

## 2020-12-26 DIAGNOSIS — K6389 Other specified diseases of intestine: Secondary | ICD-10-CM | POA: Diagnosis not present

## 2020-12-26 DIAGNOSIS — K5939 Other megacolon: Secondary | ICD-10-CM | POA: Diagnosis not present

## 2020-12-27 DIAGNOSIS — R197 Diarrhea, unspecified: Secondary | ICD-10-CM | POA: Diagnosis not present

## 2020-12-27 DIAGNOSIS — K56600 Partial intestinal obstruction, unspecified as to cause: Secondary | ICD-10-CM | POA: Diagnosis not present

## 2020-12-27 DIAGNOSIS — Z4682 Encounter for fitting and adjustment of non-vascular catheter: Secondary | ICD-10-CM | POA: Diagnosis not present

## 2020-12-27 DIAGNOSIS — R109 Unspecified abdominal pain: Secondary | ICD-10-CM | POA: Diagnosis not present

## 2020-12-27 DIAGNOSIS — R140 Abdominal distension (gaseous): Secondary | ICD-10-CM | POA: Diagnosis not present

## 2020-12-27 DIAGNOSIS — K6389 Other specified diseases of intestine: Secondary | ICD-10-CM | POA: Diagnosis not present

## 2020-12-27 DIAGNOSIS — K56609 Unspecified intestinal obstruction, unspecified as to partial versus complete obstruction: Secondary | ICD-10-CM | POA: Diagnosis not present

## 2020-12-28 DIAGNOSIS — Z4682 Encounter for fitting and adjustment of non-vascular catheter: Secondary | ICD-10-CM | POA: Diagnosis not present

## 2020-12-28 DIAGNOSIS — K567 Ileus, unspecified: Secondary | ICD-10-CM | POA: Diagnosis not present

## 2020-12-28 DIAGNOSIS — K6389 Other specified diseases of intestine: Secondary | ICD-10-CM | POA: Diagnosis not present

## 2020-12-28 DIAGNOSIS — N390 Urinary tract infection, site not specified: Secondary | ICD-10-CM | POA: Diagnosis not present

## 2020-12-28 DIAGNOSIS — R197 Diarrhea, unspecified: Secondary | ICD-10-CM | POA: Diagnosis not present

## 2020-12-29 DIAGNOSIS — N390 Urinary tract infection, site not specified: Secondary | ICD-10-CM | POA: Diagnosis not present

## 2020-12-29 DIAGNOSIS — K6389 Other specified diseases of intestine: Secondary | ICD-10-CM | POA: Diagnosis not present

## 2020-12-29 DIAGNOSIS — R197 Diarrhea, unspecified: Secondary | ICD-10-CM | POA: Diagnosis not present

## 2020-12-29 DIAGNOSIS — K567 Ileus, unspecified: Secondary | ICD-10-CM | POA: Diagnosis not present

## 2020-12-30 DIAGNOSIS — K567 Ileus, unspecified: Secondary | ICD-10-CM | POA: Diagnosis not present

## 2020-12-30 DIAGNOSIS — Z9049 Acquired absence of other specified parts of digestive tract: Secondary | ICD-10-CM | POA: Diagnosis not present

## 2020-12-30 DIAGNOSIS — N390 Urinary tract infection, site not specified: Secondary | ICD-10-CM | POA: Diagnosis not present

## 2020-12-30 DIAGNOSIS — D72829 Elevated white blood cell count, unspecified: Secondary | ICD-10-CM | POA: Diagnosis not present

## 2020-12-30 DIAGNOSIS — Z4682 Encounter for fitting and adjustment of non-vascular catheter: Secondary | ICD-10-CM | POA: Diagnosis not present

## 2020-12-31 DIAGNOSIS — N390 Urinary tract infection, site not specified: Secondary | ICD-10-CM | POA: Diagnosis not present

## 2020-12-31 DIAGNOSIS — K56609 Unspecified intestinal obstruction, unspecified as to partial versus complete obstruction: Secondary | ICD-10-CM | POA: Diagnosis not present

## 2020-12-31 DIAGNOSIS — K6389 Other specified diseases of intestine: Secondary | ICD-10-CM | POA: Diagnosis not present

## 2020-12-31 DIAGNOSIS — D72829 Elevated white blood cell count, unspecified: Secondary | ICD-10-CM | POA: Diagnosis not present

## 2020-12-31 DIAGNOSIS — Z4682 Encounter for fitting and adjustment of non-vascular catheter: Secondary | ICD-10-CM | POA: Diagnosis not present

## 2020-12-31 DIAGNOSIS — R197 Diarrhea, unspecified: Secondary | ICD-10-CM | POA: Diagnosis not present

## 2021-01-01 DIAGNOSIS — N390 Urinary tract infection, site not specified: Secondary | ICD-10-CM | POA: Diagnosis not present

## 2021-01-01 DIAGNOSIS — K567 Ileus, unspecified: Secondary | ICD-10-CM | POA: Diagnosis not present

## 2021-01-01 DIAGNOSIS — K6389 Other specified diseases of intestine: Secondary | ICD-10-CM | POA: Diagnosis not present

## 2021-01-01 DIAGNOSIS — K560 Paralytic ileus: Secondary | ICD-10-CM | POA: Diagnosis not present

## 2021-01-01 DIAGNOSIS — D72829 Elevated white blood cell count, unspecified: Secondary | ICD-10-CM | POA: Diagnosis not present

## 2021-01-02 DIAGNOSIS — R911 Solitary pulmonary nodule: Secondary | ICD-10-CM | POA: Diagnosis not present

## 2021-01-02 DIAGNOSIS — K56609 Unspecified intestinal obstruction, unspecified as to partial versus complete obstruction: Secondary | ICD-10-CM | POA: Diagnosis not present

## 2021-01-02 DIAGNOSIS — Z4682 Encounter for fitting and adjustment of non-vascular catheter: Secondary | ICD-10-CM | POA: Diagnosis not present

## 2021-01-02 DIAGNOSIS — K3189 Other diseases of stomach and duodenum: Secondary | ICD-10-CM | POA: Diagnosis not present

## 2021-01-02 DIAGNOSIS — K560 Paralytic ileus: Secondary | ICD-10-CM | POA: Diagnosis not present

## 2021-01-02 DIAGNOSIS — K429 Umbilical hernia without obstruction or gangrene: Secondary | ICD-10-CM | POA: Diagnosis not present

## 2021-01-02 DIAGNOSIS — Z452 Encounter for adjustment and management of vascular access device: Secondary | ICD-10-CM | POA: Diagnosis not present

## 2021-01-02 DIAGNOSIS — Q8909 Congenital malformations of spleen: Secondary | ICD-10-CM | POA: Diagnosis not present

## 2021-01-03 DIAGNOSIS — K56609 Unspecified intestinal obstruction, unspecified as to partial versus complete obstruction: Secondary | ICD-10-CM | POA: Diagnosis not present

## 2021-01-04 DIAGNOSIS — K118 Other diseases of salivary glands: Secondary | ICD-10-CM | POA: Diagnosis not present

## 2021-01-04 DIAGNOSIS — R109 Unspecified abdominal pain: Secondary | ICD-10-CM | POA: Diagnosis not present

## 2021-01-04 DIAGNOSIS — I700 Atherosclerosis of aorta: Secondary | ICD-10-CM | POA: Diagnosis not present

## 2021-01-04 DIAGNOSIS — K219 Gastro-esophageal reflux disease without esophagitis: Secondary | ICD-10-CM | POA: Diagnosis not present

## 2021-01-04 DIAGNOSIS — L304 Erythema intertrigo: Secondary | ICD-10-CM | POA: Diagnosis not present

## 2021-01-04 DIAGNOSIS — K56600 Partial intestinal obstruction, unspecified as to cause: Secondary | ICD-10-CM | POA: Diagnosis not present

## 2021-01-04 DIAGNOSIS — N390 Urinary tract infection, site not specified: Secondary | ICD-10-CM | POA: Diagnosis not present

## 2021-01-04 DIAGNOSIS — J4540 Moderate persistent asthma, uncomplicated: Secondary | ICD-10-CM | POA: Diagnosis not present

## 2021-01-04 DIAGNOSIS — I10 Essential (primary) hypertension: Secondary | ICD-10-CM | POA: Diagnosis not present

## 2021-01-04 DIAGNOSIS — E119 Type 2 diabetes mellitus without complications: Secondary | ICD-10-CM | POA: Diagnosis not present

## 2021-01-04 DIAGNOSIS — E785 Hyperlipidemia, unspecified: Secondary | ICD-10-CM | POA: Diagnosis not present

## 2021-01-04 DIAGNOSIS — R072 Precordial pain: Secondary | ICD-10-CM | POA: Diagnosis not present

## 2021-01-05 DIAGNOSIS — I10 Essential (primary) hypertension: Secondary | ICD-10-CM | POA: Diagnosis not present

## 2021-01-05 DIAGNOSIS — Z452 Encounter for adjustment and management of vascular access device: Secondary | ICD-10-CM | POA: Diagnosis not present

## 2021-01-05 DIAGNOSIS — I700 Atherosclerosis of aorta: Secondary | ICD-10-CM | POA: Diagnosis not present

## 2021-01-05 DIAGNOSIS — K219 Gastro-esophageal reflux disease without esophagitis: Secondary | ICD-10-CM | POA: Diagnosis not present

## 2021-01-05 DIAGNOSIS — K3189 Other diseases of stomach and duodenum: Secondary | ICD-10-CM | POA: Diagnosis not present

## 2021-01-05 DIAGNOSIS — E119 Type 2 diabetes mellitus without complications: Secondary | ICD-10-CM | POA: Diagnosis not present

## 2021-01-05 DIAGNOSIS — L304 Erythema intertrigo: Secondary | ICD-10-CM | POA: Diagnosis not present

## 2021-01-05 DIAGNOSIS — K118 Other diseases of salivary glands: Secondary | ICD-10-CM | POA: Diagnosis not present

## 2021-01-05 DIAGNOSIS — Z4682 Encounter for fitting and adjustment of non-vascular catheter: Secondary | ICD-10-CM | POA: Diagnosis not present

## 2021-01-05 DIAGNOSIS — K6389 Other specified diseases of intestine: Secondary | ICD-10-CM | POA: Diagnosis not present

## 2021-01-05 DIAGNOSIS — R072 Precordial pain: Secondary | ICD-10-CM | POA: Diagnosis not present

## 2021-01-05 DIAGNOSIS — N390 Urinary tract infection, site not specified: Secondary | ICD-10-CM | POA: Diagnosis not present

## 2021-01-05 DIAGNOSIS — J4540 Moderate persistent asthma, uncomplicated: Secondary | ICD-10-CM | POA: Diagnosis not present

## 2021-01-05 DIAGNOSIS — K56600 Partial intestinal obstruction, unspecified as to cause: Secondary | ICD-10-CM | POA: Diagnosis not present

## 2021-01-06 DIAGNOSIS — K5939 Other megacolon: Secondary | ICD-10-CM | POA: Diagnosis not present

## 2021-01-06 DIAGNOSIS — N390 Urinary tract infection, site not specified: Secondary | ICD-10-CM | POA: Diagnosis not present

## 2021-01-06 DIAGNOSIS — K56609 Unspecified intestinal obstruction, unspecified as to partial versus complete obstruction: Secondary | ICD-10-CM | POA: Diagnosis not present

## 2021-01-06 DIAGNOSIS — K567 Ileus, unspecified: Secondary | ICD-10-CM | POA: Diagnosis not present

## 2021-01-07 DIAGNOSIS — N390 Urinary tract infection, site not specified: Secondary | ICD-10-CM | POA: Diagnosis not present

## 2021-01-07 DIAGNOSIS — K56600 Partial intestinal obstruction, unspecified as to cause: Secondary | ICD-10-CM | POA: Diagnosis not present

## 2021-01-08 DIAGNOSIS — K56609 Unspecified intestinal obstruction, unspecified as to partial versus complete obstruction: Secondary | ICD-10-CM | POA: Diagnosis not present

## 2021-01-08 DIAGNOSIS — N390 Urinary tract infection, site not specified: Secondary | ICD-10-CM | POA: Diagnosis not present

## 2021-01-08 DIAGNOSIS — K567 Ileus, unspecified: Secondary | ICD-10-CM | POA: Diagnosis not present

## 2021-01-10 DIAGNOSIS — K56600 Partial intestinal obstruction, unspecified as to cause: Secondary | ICD-10-CM | POA: Diagnosis not present

## 2021-01-11 DIAGNOSIS — K56609 Unspecified intestinal obstruction, unspecified as to partial versus complete obstruction: Secondary | ICD-10-CM | POA: Diagnosis not present

## 2021-01-12 DIAGNOSIS — R072 Precordial pain: Secondary | ICD-10-CM | POA: Diagnosis not present

## 2021-01-12 DIAGNOSIS — R5381 Other malaise: Secondary | ICD-10-CM | POA: Diagnosis not present

## 2021-01-12 DIAGNOSIS — I10 Essential (primary) hypertension: Secondary | ICD-10-CM | POA: Diagnosis not present

## 2021-01-12 DIAGNOSIS — K56609 Unspecified intestinal obstruction, unspecified as to partial versus complete obstruction: Secondary | ICD-10-CM | POA: Diagnosis not present

## 2021-01-12 DIAGNOSIS — J410 Simple chronic bronchitis: Secondary | ICD-10-CM | POA: Diagnosis not present

## 2021-01-12 DIAGNOSIS — K56600 Partial intestinal obstruction, unspecified as to cause: Secondary | ICD-10-CM | POA: Diagnosis not present

## 2021-01-13 DIAGNOSIS — R251 Tremor, unspecified: Secondary | ICD-10-CM | POA: Diagnosis not present

## 2021-01-13 DIAGNOSIS — J410 Simple chronic bronchitis: Secondary | ICD-10-CM | POA: Diagnosis not present

## 2021-01-13 DIAGNOSIS — Z7401 Bed confinement status: Secondary | ICD-10-CM | POA: Diagnosis not present

## 2021-01-13 DIAGNOSIS — J45998 Other asthma: Secondary | ICD-10-CM | POA: Diagnosis not present

## 2021-01-13 DIAGNOSIS — F32A Depression, unspecified: Secondary | ICD-10-CM | POA: Diagnosis not present

## 2021-01-13 DIAGNOSIS — K56600 Partial intestinal obstruction, unspecified as to cause: Secondary | ICD-10-CM | POA: Diagnosis not present

## 2021-01-13 DIAGNOSIS — E119 Type 2 diabetes mellitus without complications: Secondary | ICD-10-CM | POA: Diagnosis not present

## 2021-01-13 DIAGNOSIS — G8190 Hemiplegia, unspecified affecting unspecified side: Secondary | ICD-10-CM | POA: Diagnosis not present

## 2021-01-13 DIAGNOSIS — R278 Other lack of coordination: Secondary | ICD-10-CM | POA: Diagnosis not present

## 2021-01-13 DIAGNOSIS — E1143 Type 2 diabetes mellitus with diabetic autonomic (poly)neuropathy: Secondary | ICD-10-CM | POA: Diagnosis not present

## 2021-01-13 DIAGNOSIS — I10 Essential (primary) hypertension: Secondary | ICD-10-CM | POA: Diagnosis not present

## 2021-01-13 DIAGNOSIS — R072 Precordial pain: Secondary | ICD-10-CM | POA: Diagnosis not present

## 2021-01-13 DIAGNOSIS — K118 Other diseases of salivary glands: Secondary | ICD-10-CM | POA: Diagnosis not present

## 2021-01-13 DIAGNOSIS — K219 Gastro-esophageal reflux disease without esophagitis: Secondary | ICD-10-CM | POA: Diagnosis not present

## 2021-01-13 DIAGNOSIS — M6281 Muscle weakness (generalized): Secondary | ICD-10-CM | POA: Diagnosis not present

## 2021-01-13 DIAGNOSIS — E785 Hyperlipidemia, unspecified: Secondary | ICD-10-CM | POA: Diagnosis not present

## 2021-01-13 DIAGNOSIS — R5381 Other malaise: Secondary | ICD-10-CM | POA: Diagnosis not present

## 2021-01-13 DIAGNOSIS — R262 Difficulty in walking, not elsewhere classified: Secondary | ICD-10-CM | POA: Diagnosis not present

## 2021-01-13 DIAGNOSIS — E782 Mixed hyperlipidemia: Secondary | ICD-10-CM | POA: Diagnosis not present

## 2021-01-13 DIAGNOSIS — R531 Weakness: Secondary | ICD-10-CM | POA: Diagnosis not present

## 2021-01-14 DIAGNOSIS — E119 Type 2 diabetes mellitus without complications: Secondary | ICD-10-CM | POA: Diagnosis not present

## 2021-01-14 DIAGNOSIS — R072 Precordial pain: Secondary | ICD-10-CM | POA: Diagnosis not present

## 2021-01-14 DIAGNOSIS — I10 Essential (primary) hypertension: Secondary | ICD-10-CM | POA: Diagnosis not present

## 2021-01-14 DIAGNOSIS — K56600 Partial intestinal obstruction, unspecified as to cause: Secondary | ICD-10-CM | POA: Diagnosis not present

## 2021-01-14 DIAGNOSIS — K219 Gastro-esophageal reflux disease without esophagitis: Secondary | ICD-10-CM | POA: Diagnosis not present

## 2021-01-14 DIAGNOSIS — E785 Hyperlipidemia, unspecified: Secondary | ICD-10-CM | POA: Diagnosis not present

## 2021-01-14 DIAGNOSIS — F32A Depression, unspecified: Secondary | ICD-10-CM | POA: Diagnosis not present

## 2021-01-14 DIAGNOSIS — K118 Other diseases of salivary glands: Secondary | ICD-10-CM | POA: Diagnosis not present

## 2021-01-17 DIAGNOSIS — I10 Essential (primary) hypertension: Secondary | ICD-10-CM | POA: Diagnosis not present

## 2021-01-17 DIAGNOSIS — E782 Mixed hyperlipidemia: Secondary | ICD-10-CM | POA: Diagnosis not present

## 2021-01-17 DIAGNOSIS — E1143 Type 2 diabetes mellitus with diabetic autonomic (poly)neuropathy: Secondary | ICD-10-CM | POA: Diagnosis not present

## 2021-01-22 DIAGNOSIS — E119 Type 2 diabetes mellitus without complications: Secondary | ICD-10-CM | POA: Diagnosis not present

## 2021-02-02 DIAGNOSIS — R197 Diarrhea, unspecified: Secondary | ICD-10-CM | POA: Diagnosis not present

## 2021-02-11 DIAGNOSIS — E119 Type 2 diabetes mellitus without complications: Secondary | ICD-10-CM | POA: Diagnosis not present

## 2021-02-11 DIAGNOSIS — H2513 Age-related nuclear cataract, bilateral: Secondary | ICD-10-CM | POA: Diagnosis not present

## 2021-02-11 DIAGNOSIS — H524 Presbyopia: Secondary | ICD-10-CM | POA: Diagnosis not present

## 2021-02-11 DIAGNOSIS — H04123 Dry eye syndrome of bilateral lacrimal glands: Secondary | ICD-10-CM | POA: Diagnosis not present

## 2021-02-12 DIAGNOSIS — E119 Type 2 diabetes mellitus without complications: Secondary | ICD-10-CM | POA: Diagnosis not present

## 2021-02-12 DIAGNOSIS — K118 Other diseases of salivary glands: Secondary | ICD-10-CM | POA: Diagnosis not present

## 2021-02-12 DIAGNOSIS — R072 Precordial pain: Secondary | ICD-10-CM | POA: Diagnosis not present

## 2021-02-12 DIAGNOSIS — K219 Gastro-esophageal reflux disease without esophagitis: Secondary | ICD-10-CM | POA: Diagnosis not present

## 2021-02-12 DIAGNOSIS — E785 Hyperlipidemia, unspecified: Secondary | ICD-10-CM | POA: Diagnosis not present

## 2021-02-12 DIAGNOSIS — F32A Depression, unspecified: Secondary | ICD-10-CM | POA: Diagnosis not present

## 2021-02-16 DIAGNOSIS — H5213 Myopia, bilateral: Secondary | ICD-10-CM | POA: Diagnosis not present

## 2021-02-24 DIAGNOSIS — E119 Type 2 diabetes mellitus without complications: Secondary | ICD-10-CM | POA: Diagnosis not present

## 2021-02-26 DIAGNOSIS — R262 Difficulty in walking, not elsewhere classified: Secondary | ICD-10-CM | POA: Diagnosis not present

## 2021-02-26 DIAGNOSIS — M25519 Pain in unspecified shoulder: Secondary | ICD-10-CM | POA: Diagnosis not present

## 2021-02-26 DIAGNOSIS — M6281 Muscle weakness (generalized): Secondary | ICD-10-CM | POA: Diagnosis not present

## 2021-03-01 DIAGNOSIS — R262 Difficulty in walking, not elsewhere classified: Secondary | ICD-10-CM | POA: Diagnosis not present

## 2021-03-01 DIAGNOSIS — M6281 Muscle weakness (generalized): Secondary | ICD-10-CM | POA: Diagnosis not present

## 2021-03-02 DIAGNOSIS — M6281 Muscle weakness (generalized): Secondary | ICD-10-CM | POA: Diagnosis not present

## 2021-03-02 DIAGNOSIS — R262 Difficulty in walking, not elsewhere classified: Secondary | ICD-10-CM | POA: Diagnosis not present

## 2021-03-04 DIAGNOSIS — R262 Difficulty in walking, not elsewhere classified: Secondary | ICD-10-CM | POA: Diagnosis not present

## 2021-03-04 DIAGNOSIS — M6281 Muscle weakness (generalized): Secondary | ICD-10-CM | POA: Diagnosis not present

## 2021-03-05 DIAGNOSIS — R262 Difficulty in walking, not elsewhere classified: Secondary | ICD-10-CM | POA: Diagnosis not present

## 2021-03-05 DIAGNOSIS — M25519 Pain in unspecified shoulder: Secondary | ICD-10-CM | POA: Diagnosis not present

## 2021-03-05 DIAGNOSIS — M6281 Muscle weakness (generalized): Secondary | ICD-10-CM | POA: Diagnosis not present

## 2021-03-09 DIAGNOSIS — R262 Difficulty in walking, not elsewhere classified: Secondary | ICD-10-CM | POA: Diagnosis not present

## 2021-03-09 DIAGNOSIS — M6281 Muscle weakness (generalized): Secondary | ICD-10-CM | POA: Diagnosis not present

## 2021-03-10 DIAGNOSIS — M6281 Muscle weakness (generalized): Secondary | ICD-10-CM | POA: Diagnosis not present

## 2021-03-10 DIAGNOSIS — R262 Difficulty in walking, not elsewhere classified: Secondary | ICD-10-CM | POA: Diagnosis not present

## 2021-03-11 DIAGNOSIS — R262 Difficulty in walking, not elsewhere classified: Secondary | ICD-10-CM | POA: Diagnosis not present

## 2021-03-11 DIAGNOSIS — M6281 Muscle weakness (generalized): Secondary | ICD-10-CM | POA: Diagnosis not present

## 2021-03-12 DIAGNOSIS — R262 Difficulty in walking, not elsewhere classified: Secondary | ICD-10-CM | POA: Diagnosis not present

## 2021-03-12 DIAGNOSIS — M6281 Muscle weakness (generalized): Secondary | ICD-10-CM | POA: Diagnosis not present

## 2021-03-15 DIAGNOSIS — G8929 Other chronic pain: Secondary | ICD-10-CM | POA: Diagnosis not present

## 2021-03-15 DIAGNOSIS — E119 Type 2 diabetes mellitus without complications: Secondary | ICD-10-CM | POA: Diagnosis not present

## 2021-03-15 DIAGNOSIS — M6281 Muscle weakness (generalized): Secondary | ICD-10-CM | POA: Diagnosis not present

## 2021-03-15 DIAGNOSIS — F32A Depression, unspecified: Secondary | ICD-10-CM | POA: Diagnosis not present

## 2021-03-15 DIAGNOSIS — R262 Difficulty in walking, not elsewhere classified: Secondary | ICD-10-CM | POA: Diagnosis not present

## 2021-03-15 DIAGNOSIS — J45909 Unspecified asthma, uncomplicated: Secondary | ICD-10-CM | POA: Diagnosis not present

## 2021-03-15 DIAGNOSIS — I10 Essential (primary) hypertension: Secondary | ICD-10-CM | POA: Diagnosis not present

## 2021-03-15 DIAGNOSIS — R251 Tremor, unspecified: Secondary | ICD-10-CM | POA: Diagnosis not present

## 2021-03-15 DIAGNOSIS — K219 Gastro-esophageal reflux disease without esophagitis: Secondary | ICD-10-CM | POA: Diagnosis not present

## 2021-03-16 DIAGNOSIS — R262 Difficulty in walking, not elsewhere classified: Secondary | ICD-10-CM | POA: Diagnosis not present

## 2021-03-16 DIAGNOSIS — M6281 Muscle weakness (generalized): Secondary | ICD-10-CM | POA: Diagnosis not present

## 2021-03-17 DIAGNOSIS — M6281 Muscle weakness (generalized): Secondary | ICD-10-CM | POA: Diagnosis not present

## 2021-03-17 DIAGNOSIS — R262 Difficulty in walking, not elsewhere classified: Secondary | ICD-10-CM | POA: Diagnosis not present

## 2021-03-18 DIAGNOSIS — M6281 Muscle weakness (generalized): Secondary | ICD-10-CM | POA: Diagnosis not present

## 2021-03-18 DIAGNOSIS — R262 Difficulty in walking, not elsewhere classified: Secondary | ICD-10-CM | POA: Diagnosis not present

## 2021-03-19 DIAGNOSIS — R262 Difficulty in walking, not elsewhere classified: Secondary | ICD-10-CM | POA: Diagnosis not present

## 2021-03-19 DIAGNOSIS — M6281 Muscle weakness (generalized): Secondary | ICD-10-CM | POA: Diagnosis not present

## 2021-04-20 ENCOUNTER — Encounter (INDEPENDENT_AMBULATORY_CARE_PROVIDER_SITE_OTHER): Payer: Self-pay | Admitting: *Deleted

## 2021-08-23 ENCOUNTER — Encounter (INDEPENDENT_AMBULATORY_CARE_PROVIDER_SITE_OTHER): Payer: Self-pay | Admitting: Gastroenterology

## 2021-08-23 ENCOUNTER — Ambulatory Visit (INDEPENDENT_AMBULATORY_CARE_PROVIDER_SITE_OTHER): Payer: Medicare Other | Admitting: Gastroenterology

## 2021-08-23 ENCOUNTER — Other Ambulatory Visit: Payer: Self-pay

## 2021-08-23 DIAGNOSIS — R1310 Dysphagia, unspecified: Secondary | ICD-10-CM | POA: Insufficient documentation

## 2021-08-23 NOTE — Progress Notes (Signed)
Katrinka Blazing, M.D. Gastroenterology & Hepatology [facility] [address] [place], [place] [address] Primary Care Physician: Richardean Chimera, MD [address] [place] [address]  Referring MD: PCP  Chief Complaint: Dysphagia  History of Present Illness: Valerie Wagner is a 71 y.o. female with past medical history of anxiety, asthma, COPD, depression, hypertension, fibromyalgia, stroke, IBS, neuropathy, type 2 diabetes, who presents for evaluation of dysphagia.  Patient reports that she has presented dysphagia intermittently in the past. States she believes this has been present for more than a year. This happens with specific foods such as meat and bread, but not every single time he eats it. It also happens when she gets upset. Denies having to vomit he food or prior episodes of food impaction. She reports passing gas frequently as well. The patient denies having any nausea, vomiting, fever, chills, hematochezia, melena, hematemesis, abdominal distention, abdominal pain, diarrhea, jaundice, pruritus. States she has lost close to 100 lb as she changed her diet, but also as she does not like the food at the facility where she resides.  Notably, she states that because of her COPD she coughs often and she presents with throat discomfort after coughing.  Has also been concerned as she has COPD and cough at night, which leads to irritation sensation in her throat.  Takes tramadol for back and leg pain, possibly 3 times a day.  Last HCW:CBJSE Last Colonoscopy: 03/25/2008-normal colonoscopy  FHx: neg for any gastrointestinal/liver disease, father and sister had cancer (unknown type for both), niece breast cancer, grandmother and grandfather mesothelioma Social: quit smoking at age 70, neg frequent alcohol or illicit drug use Surgical: hysterectomy, cholecystectomy, c-section  Past Medical History: Past Medical History:  Diagnosis Date    Anxiety    Asthma    Chest pain    Reassuring workup of her time including negative cardiac catheterization and stress testing   COPD (chronic obstructive pulmonary disease) (HCC)    DDD (degenerative disc disease), lumbar    Depression    DJD (degenerative joint disease)    Essential hypertension, benign    Fibromyalgia    GERD (gastroesophageal reflux disease)    IBS (irritable bowel syndrome)    Mixed hyperlipidemia    Neuropathy    Obesity    Shortness of breath dyspnea    Type 2 diabetes mellitus (HCC)    Urinary incontinence     Past Surgical History: Past Surgical History:  Procedure Laterality Date   CARPAL TUNNEL RELEASE     Right   CESAREAN SECTION     CHOLECYSTECTOMY     Hysterectomy -- unknown     Left Thumb Surgery     RADIOLOGY WITH ANESTHESIA N/A 06/30/2015   Procedure: RADIOLOGY WITH ANESTHESIA;  Surgeon: Simonne Come, MD;  Location: [facility];  Service: Radiology;  Laterality: N/A;   TONSILLECTOMY      Family History: Family History  Problem Relation Age of Onset   Coronary artery disease Father    Heart attack Father        Died age 90    Social History: Social History   Tobacco Use  Smoking Status Former   Packs/day: 1.00   Years: 2.00   Pack years: 2.00   Types: Cigarettes   Quit date: 06/20/1982   Years since quitting: 39.2  Smokeless Tobacco Never   Social History   Substance and Sexual Activity  Alcohol Use No   Social History  Substance and Sexual Activity  Drug Use No    Allergies: Allergies  Allergen Reactions   Nsaids Shortness Of Breath   Bee Venom    Chocolate    Darvon [Propoxyphene]    Ivp Dye [Iodinated Contrast Media] Swelling   Doxycycline Rash   Penicillins Rash    Medications: Current Outpatient Medications  Medication Sig Dispense Refill   ACCU-CHEK AVIVA PLUS test strip      albuterol (VENTOLIN HFA) 108 (90 Base) MCG/ACT inhaler Inhale 2 puffs into the lungs every 6 (six) hours as needed for  wheezing or shortness of breath.      ALPRAZolam (XANAX) 0.5 MG tablet Take 0.5 mg by mouth 3 (three) times daily as needed.     amLODipine (NORVASC) 5 MG tablet Take 5 mg by mouth daily.     atorvastatin (LIPITOR) 80 MG tablet Take 80 mg by mouth daily.     benazepril-hydrochlorthiazide (LOTENSIN HCT) 20-12.5 MG per tablet Take 2 tablets by mouth daily.      cyclobenzaprine (FLEXERIL) 10 MG tablet Take by mouth.     DULoxetine (CYMBALTA) 60 MG capsule Take 60 mg by mouth daily.     EPINEPHrine (EPIPEN JR) 0.15 MG/0.3ML injection Inject 0.15 mg into the muscle as needed for anaphylaxis.     Fluticasone-Salmeterol (ADVAIR) 500-50 MCG/DOSE AEPB Inhale 1 puff into the lungs every 12 (twelve) hours.     glipiZIDE (GLUCOTROL XL) 10 MG 24 hr tablet      GLOBAL EASE INJECT PEN NEEDLES 31G X 8 MM MISC Inject 1 Syringe into the skin at bedtime.     insulin glargine (LANTUS) 100 UNIT/ML injection Inject 0.4 mLs (40 Units total) into the skin at bedtime. 10 mL 11   ipratropium-albuterol (DUONEB) 0.5-2.5 (3) MG/3ML SOLN Take 3 mLs by nebulization every 4 (four) hours as needed (wheezing).      LANTUS SOLOSTAR 100 UNIT/ML Solostar Pen      levETIRAcetam (KEPPRA) 500 MG tablet Take 1 tablet (500 mg total) by mouth 2 (two) times daily.     nitroGLYCERIN (NITROSTAT) 0.4 MG SL tablet Place 0.4 mg under the tongue every 5 (five) minutes as needed for chest pain.      Potassium Citrate 15 MEQ (1620 MG) TBCR Take 1 tablet by mouth 2 (two) times daily.     pregabalin (LYRICA) 50 MG capsule Take 50 mg by mouth 2 (two) times daily.     tiotropium (SPIRIVA) 18 MCG inhalation capsule Place 18 mcg into inhaler and inhale daily.     traMADol (ULTRAM) 50 MG tablet Take 50 mg by mouth 3 (three) times daily as needed.     triamcinolone (KENALOG) 0.1 % Apply topically.     No current facility-administered medications for this visit.    Review of Systems: GENERAL: negative for malaise, night sweats HEENT: No changes in  hearing or vision, no nose bleeds or other nasal problems. NECK: Negative for lumps, goiter, pain and significant neck swelling RESPIRATORY: Negative for cough, wheezing CARDIOVASCULAR: Negative for chest pain, leg swelling, palpitations, orthopnea GI: SEE HPI MUSCULOSKELETAL: Negative for joint pain or swelling, back pain, and muscle pain. SKIN: Negative for lesions, rash PSYCH: Negative for sleep disturbance, mood disorder and recent psychosocial stressors. HEMATOLOGY Negative for prolonged bleeding, bruising easily, and swollen nodes. ENDOCRINE: Negative for cold or heat intolerance, polyuria, polydipsia and goiter. NEURO: negative for tremor, gait imbalance, syncope and seizures. The remainder of the review of systems is noncontributory.   Physical Exam: BP Marland Kitchen)  96/56 (BP Location: Right Arm, Patient Position: Sitting, Cuff Size: Large)    Pulse (!) 53    Temp 98.3 F (36.8 C) (Oral)    Ht 5\' 3"  (1.6 m)    BMI 46.06 kg/m  GENERAL: The patient is AO x3, in no acute distress. Obese. Uses wheelchair. HEENT: Head is normocephalic and atraumatic. EOMI are intact. Mouth is well hydrated and without lesions. NECK: Supple. No masses LUNGS: Clear to auscultation. No presence of rhonchi/wheezing/rales. Adequate chest expansion HEART: RRR, normal s1 and s2. ABDOMEN: Soft, nontender, no guarding, no peritoneal signs, and nondistended. BS +. No masses. EXTREMITIES: Without any cyanosis, clubbing, rash, lesions or edema. NEUROLOGIC: AOx3, no focal motor deficit. SKIN: no jaundice, no rashes   Imaging/Labs: as above  I personally reviewed and interpreted the available labs, imaging and endoscopic files.  Impression and Plan: Valerie Wagner is a 71 y.o. female with past medical history of anxiety, asthma, COPD, depression, hypertension, fibromyalgia, stroke, IBS, neuropathy, type 2 diabetes, who presents for evaluation of dysphagia.  The patient presented recurrent episodes of dysphagia to solids  without red flag signs.  We will need to explore this further with an EGD to evaluate peptic strictures or other organic etiologies causing her symptoms.  I explained that even though some of her symptoms are gastrointestinal in nature, other symptoms such as pain in her throat after coughing are related to her chronic respiratory disease and strain after coughing.  Patient understood and agreed.  We will also proceed with a colonoscopy as she is overdue for screening colonoscopy.  - Schedule EGD with possible ED and colonoscopy  All questions were answered.      75, MD Gastroenterology and Hepatology [facility]

## 2021-08-23 NOTE — H&P (View-Only) (Signed)
Valerie Wagner, M.D. ?Gastroenterology & Hepatology ?[facility] ?[address] ?[place], [place] [address] ?Primary Care Physician: ?Richardean Chimera, MD ?[address] ?[place] [address] ? ?Referring MD: PCP ? ?Chief Complaint: Dysphagia ? ?History of Present Illness: ?Valerie Wagner is a 71 y.o. female with past medical history of anxiety, asthma, COPD, depression, hypertension, fibromyalgia, stroke, IBS, neuropathy, type 2 diabetes, who presents for evaluation of dysphagia. ? ?Patient reports that she has presented dysphagia intermittently in the past. States she believes this has been present for more than a year. This happens with specific foods such as meat and bread, but not every single time he eats it. It also happens when she gets upset. Denies having to vomit he food or prior episodes of food impaction. She reports passing gas frequently as well. The patient denies having any nausea, vomiting, fever, chills, hematochezia, melena, hematemesis, abdominal distention, abdominal pain, diarrhea, jaundice, pruritus. States she has lost close to 100 lb as she changed her diet, but also as she does not like the food at the facility where she resides. ? ?Notably, she states that because of her COPD she coughs often and she presents with throat discomfort after coughing. ? ?Has also been concerned as she has COPD and cough at night, which leads to irritation sensation in her throat. ? ?Takes tramadol for back and leg pain, possibly 3 times a day. ? ?Last NLZ:JQBHA ?Last Colonoscopy: 03/25/2008-normal colonoscopy ? ?FHx: neg for any gastrointestinal/liver disease, father and sister had cancer (unknown type for both), niece breast cancer, grandmother and grandfather mesothelioma ?Social: quit smoking at age 20, neg frequent alcohol or illicit drug use ?Surgical: hysterectomy, cholecystectomy, c-section ? ?Past Medical History: ?Past Medical History:  ?Diagnosis Date  ?  Anxiety   ? Asthma   ? Chest pain   ? Reassuring workup of her time including negative cardiac catheterization and stress testing  ? COPD (chronic obstructive pulmonary disease) (HCC)   ? DDD (degenerative disc disease), lumbar   ? Depression   ? DJD (degenerative joint disease)   ? Essential hypertension, benign   ? Fibromyalgia   ? GERD (gastroesophageal reflux disease)   ? IBS (irritable bowel syndrome)   ? Mixed hyperlipidemia   ? Neuropathy   ? Obesity   ? Shortness of breath dyspnea   ? Type 2 diabetes mellitus (HCC)   ? Urinary incontinence   ? ? ?Past Surgical History: ?Past Surgical History:  ?Procedure Laterality Date  ? CARPAL TUNNEL RELEASE    ? Right  ? CESAREAN SECTION    ? CHOLECYSTECTOMY    ? Hysterectomy -- unknown    ? Left Thumb Surgery    ? RADIOLOGY WITH ANESTHESIA N/A 06/30/2015  ? Procedure: RADIOLOGY WITH ANESTHESIA;  Surgeon: Simonne Come, MD;  Location: [facility];  Service: Radiology;  Laterality: N/A;  ? TONSILLECTOMY    ? ? ?Family History: ?Family History  ?Problem Relation Age of Onset  ? Coronary artery disease Father   ? Heart attack Father   ?     Died age 69  ? ? ?Social History: ?Social History  ? ?Tobacco Use  ?Smoking Status Former  ? Packs/day: 1.00  ? Years: 2.00  ? Pack years: 2.00  ? Types: Cigarettes  ? Quit date: 06/20/1982  ? Years since quitting: 39.2  ?Smokeless Tobacco Never  ? ?Social History  ? ?Substance and Sexual Activity  ?Alcohol Use No  ? ?Social History  ? ?  Substance and Sexual Activity  ?Drug Use No  ? ? ?Allergies: ?Allergies  ?Allergen Reactions  ? Nsaids Shortness Of Breath  ? Bee Venom   ? Chocolate   ? Darvon [Propoxyphene]   ? Ivp Dye [Iodinated Contrast Media] Swelling  ? Doxycycline Rash  ? Penicillins Rash  ? ? ?Medications: ?Current Outpatient Medications  ?Medication Sig Dispense Refill  ? ACCU-CHEK AVIVA PLUS test strip     ? albuterol (VENTOLIN HFA) 108 (90 Base) MCG/ACT inhaler Inhale 2 puffs into the lungs every 6 (six) hours as needed for  wheezing or shortness of breath.     ? ALPRAZolam (XANAX) 0.5 MG tablet Take 0.5 mg by mouth 3 (three) times daily as needed.    ? amLODipine (NORVASC) 5 MG tablet Take 5 mg by mouth daily.    ? atorvastatin (LIPITOR) 80 MG tablet Take 80 mg by mouth daily.    ? benazepril-hydrochlorthiazide (LOTENSIN HCT) 20-12.5 MG per tablet Take 2 tablets by mouth daily.     ? cyclobenzaprine (FLEXERIL) 10 MG tablet Take by mouth.    ? DULoxetine (CYMBALTA) 60 MG capsule Take 60 mg by mouth daily.    ? EPINEPHrine (EPIPEN JR) 0.15 MG/0.3ML injection Inject 0.15 mg into the muscle as needed for anaphylaxis.    ? Fluticasone-Salmeterol (ADVAIR) 500-50 MCG/DOSE AEPB Inhale 1 puff into the lungs every 12 (twelve) hours.    ? glipiZIDE (GLUCOTROL XL) 10 MG 24 hr tablet     ? GLOBAL EASE INJECT PEN NEEDLES 31G X 8 MM MISC Inject 1 Syringe into the skin at bedtime.    ? insulin glargine (LANTUS) 100 UNIT/ML injection Inject 0.4 mLs (40 Units total) into the skin at bedtime. 10 mL 11  ? ipratropium-albuterol (DUONEB) 0.5-2.5 (3) MG/3ML SOLN Take 3 mLs by nebulization every 4 (four) hours as needed (wheezing).     ? LANTUS SOLOSTAR 100 UNIT/ML Solostar Pen     ? levETIRAcetam (KEPPRA) 500 MG tablet Take 1 tablet (500 mg total) by mouth 2 (two) times daily.    ? nitroGLYCERIN (NITROSTAT) 0.4 MG SL tablet Place 0.4 mg under the tongue every 5 (five) minutes as needed for chest pain.     ? Potassium Citrate 15 MEQ (1620 MG) TBCR Take 1 tablet by mouth 2 (two) times daily.    ? pregabalin (LYRICA) 50 MG capsule Take 50 mg by mouth 2 (two) times daily.    ? tiotropium (SPIRIVA) 18 MCG inhalation capsule Place 18 mcg into inhaler and inhale daily.    ? traMADol (ULTRAM) 50 MG tablet Take 50 mg by mouth 3 (three) times daily as needed.    ? triamcinolone (KENALOG) 0.1 % Apply topically.    ? ?No current facility-administered medications for this visit.  ? ? ?Review of Systems: ?GENERAL: negative for malaise, night sweats ?HEENT: No changes in  hearing or vision, no nose bleeds or other nasal problems. ?NECK: Negative for lumps, goiter, pain and significant neck swelling ?RESPIRATORY: Negative for cough, wheezing ?CARDIOVASCULAR: Negative for chest pain, leg swelling, palpitations, orthopnea ?GI: SEE HPI ?MUSCULOSKELETAL: Negative for joint pain or swelling, back pain, and muscle pain. ?SKIN: Negative for lesions, rash ?PSYCH: Negative for sleep disturbance, mood disorder and recent psychosocial stressors. ?HEMATOLOGY Negative for prolonged bleeding, bruising easily, and swollen nodes. ?ENDOCRINE: Negative for cold or heat intolerance, polyuria, polydipsia and goiter. ?NEURO: negative for tremor, gait imbalance, syncope and seizures. ?The remainder of the review of systems is noncontributory. ? ? ?Physical Exam: ?BP Marland Kitchen)  96/56 (BP Location: Right Arm, Patient Position: Sitting, Cuff Size: Large)   Pulse (!) 53   Temp 98.3 ?F (36.8 ?C) (Oral)   Ht 5\' 3"  (1.6 m)   BMI 46.06 kg/m?  ?GENERAL: The patient is AO x3, in no acute distress. Obese. Uses wheelchair. ?HEENT: Head is normocephalic and atraumatic. EOMI are intact. Mouth is well hydrated and without lesions. ?NECK: Supple. No masses ?LUNGS: Clear to auscultation. No presence of rhonchi/wheezing/rales. Adequate chest expansion ?HEART: RRR, normal s1 and s2. ?ABDOMEN: Soft, nontender, no guarding, no peritoneal signs, and nondistended. BS +. No masses. ?EXTREMITIES: Without any cyanosis, clubbing, rash, lesions or edema. ?NEUROLOGIC: AOx3, no focal motor deficit. ?SKIN: no jaundice, no rashes ? ? ?Imaging/Labs: ?as above ? ?I personally reviewed and interpreted the available labs, imaging and endoscopic files. ? ?Impression and Plan: ?Valerie Wagner is a 71 y.o. female with past medical history of anxiety, asthma, COPD, depression, hypertension, fibromyalgia, stroke, IBS, neuropathy, type 2 diabetes, who presents for evaluation of dysphagia.  The patient presented recurrent episodes of dysphagia to solids  without red flag signs.  We will need to explore this further with an EGD to evaluate peptic strictures or other organic etiologies causing her symptoms.  I explained that even though some of her symptoms are gastr

## 2021-08-23 NOTE — Patient Instructions (Addendum)
Schedule EGD and colonoscopy

## 2021-08-24 ENCOUNTER — Other Ambulatory Visit (INDEPENDENT_AMBULATORY_CARE_PROVIDER_SITE_OTHER): Payer: Self-pay

## 2021-08-24 ENCOUNTER — Telehealth (INDEPENDENT_AMBULATORY_CARE_PROVIDER_SITE_OTHER): Payer: Self-pay

## 2021-08-24 MED ORDER — PEG 3350-KCL-NA BICARB-NACL 420 G PO SOLR: 4000.0000 mL | ORAL | 0 refills | Status: DC

## 2021-08-24 NOTE — Telephone Encounter (Signed)
Valerie Wagner Valerie Wagner Valerie Wagner, CMA  ?

## 2021-08-25 ENCOUNTER — Encounter (INDEPENDENT_AMBULATORY_CARE_PROVIDER_SITE_OTHER): Payer: Self-pay

## 2021-09-15 NOTE — Patient Instructions (Signed)
? ? ? ? ? ? ? ? Valerie Wagner ? 09/15/2021  ?  ? @PREFPERIOPPHARMACY @ ? ? Your procedure is scheduled on  09/21/2021. ? ? Report to 11/21/2021 at  0630  A.M. ? ? Call this number if you have problems the morning of surgery: ? [phone] ? ? Remember: ? Follow the diet and prep instructions given to you by the office. ? ?Take 25 units of lantus the night before your procedure. ? ?Use your inhaler before you come and bring your rescue inhaler with you. ? ?DO NOT take any medications for diabetes the morning of your procedure. ?  ? Take these medicines the morning of surgery with A SIP OF WATER  ? ?cymbalta, ditropan, protonix, tramadol. ?  ? Do not wear jewelry, make-up or nail polish. ? Do not wear lotions, powders, or perfumes, or deodorant. ? Do not shave 48 hours prior to surgery.  Men may shave face and neck. ? Do not bring valuables to the hospital. ? [facility] is not responsible for any belongings or valuables. ? ?Contacts, dentures or bridgework may not be worn into surgery.  Leave your suitcase in the car.  After surgery it may be brought to your room. ? ?For patients admitted to the hospital, discharge time will be determined by your treatment team. ? ?Patients discharged the day of surgery will not be allowed to drive home and must have someone with them for 24 hours.  ? ? ?Special instructions:   DO NOT smoke tobacco or vape for 24 hours before your procedure. ? ?Please read over the following fact sheets that you were given. ?Anesthesia Post-op Instructions and Care and Recovery After Surgery ?  ? ? ? Upper Endoscopy, Adult, Care After ?This sheet gives you information about how to care for yourself after your procedure. Your health care provider may also give you more specific instructions. If you have problems or questions, contact your health care provider. ?What can I expect after the procedure? ?After the procedure, it is common to have: ?A sore throat. ?Mild stomach pain or  discomfort. ?Bloating. ?Nausea. ?Follow these instructions at home: ? ?Follow instructions from your health care provider about what to eat or drink after your procedure. ?Return to your normal activities as told by your health care provider. Ask your health care provider what activities are safe for you. ?Take over-the-counter and prescription medicines only as told by your health care provider. ?If you were given a sedative during the procedure, it can affect you for several hours. Do not drive or operate machinery until your health care provider says that it is safe. ?Keep all follow-up visits as told by your health care provider. This is important. ?Contact a health care provider if you have: ?A sore throat that lasts longer than one day. ?Trouble swallowing. ?Get help right away if: ?You vomit blood or your vomit looks like coffee grounds. ?You have: ?A fever. ?Bloody, black, or tarry stools. ?A severe sore throat or you cannot swallow. ?Difficulty breathing. ?Severe pain in your chest or abdomen. ?Summary ?After the procedure, it is common to have a sore throat, mild stomach discomfort, bloating, and nausea. ?If you were given a sedative during the procedure, it can affect you for several hours. Do not drive or operate machinery until your health care provider says that it is safe. ?Follow instructions from your health care provider about what to eat or drink after your procedure. ?Return to your normal activities as told by  your health care provider. ?This information is not intended to replace advice given to you by your health care provider. Make sure you discuss any questions you have with your health care provider. ?Document Revised: 04/12/2019 Document Reviewed: 11/06/2017 ?Elsevier Patient Education ? 2022 [employer] ?Colonoscopy, Adult, Care After ?This sheet gives you information about how to care for yourself after your procedure. Your health care provider may also give you more specific  instructions. If you have problems or questions, contact your health care provider. ?What can I expect after the procedure? ?After the procedure, it is common to have: ?A small amount of blood in your stool for 24 hours after the procedure. ?Some gas. ?Mild cramping or bloating of your abdomen. ?Follow these instructions at home: ?Eating and drinking ? ?Drink enough fluid to keep your urine pale yellow. ?Follow instructions from your health care provider about eating or drinking restrictions. ?Resume your normal diet as instructed by your health care provider. Avoid heavy or fried foods that are hard to digest. ?Activity ?Rest as told by your health care provider. ?Avoid sitting for a long time without moving. Get up to take short walks every 1-2 hours. This is important to improve blood flow and breathing. Ask for help if you feel weak or unsteady. ?Return to your normal activities as told by your health care provider. Ask your health care provider what activities are safe for you. ?Managing cramping and bloating ? ?Try walking around when you have cramps or feel bloated. ?Apply heat to your abdomen as told by your health care provider. Use the heat source that your health care provider recommends, such as a moist heat pack or a heating pad. ?Place a towel between your skin and the heat source. ?Leave the heat on for 20-30 minutes. ?Remove the heat if your skin turns bright red. This is especially important if you are unable to feel pain, heat, or cold. You may have a greater risk of getting burned. ?General instructions ?If you were given a sedative during the procedure, it can affect you for several hours. Do not drive or operate machinery until your health care provider says that it is safe. ?For the first 24 hours after the procedure: ?Do not sign important documents. ?Do not drink alcohol. ?Do your regular daily activities at a slower pace than normal. ?Eat soft foods that are easy to digest. ?Take  over-the-counter and prescription medicines only as told by your health care provider. ?Keep all follow-up visits as told by your health care provider. This is important. ?Contact a health care provider if: ?You have blood in your stool 2-3 days after the procedure. ?Get help right away if you have: ?More than a small spotting of blood in your stool. ?Large blood clots in your stool. ?Swelling of your abdomen. ?Nausea or vomiting. ?A fever. ?Increasing pain in your abdomen that is not relieved with medicine. ?Summary ?After the procedure, it is common to have a small amount of blood in your stool. You may also have mild cramping and bloating of your abdomen. ?If you were given a sedative during the procedure, it can affect you for several hours. Do not drive or operate machinery until your health care provider says that it is safe. ?Get help right away if you have a lot of blood in your stool, nausea or vomiting, a fever, or increased pain in your abdomen. ?This information is not intended to replace advice given to you by your health care provider. Make sure  you discuss any questions you have with your health care provider. ?Document Revised: 04/12/2019 Document Reviewed: 12/31/2018 ?Elsevier Patient Education ? 2022 Elsevier Inc. ?Monitored Anesthesia Care, Care After ?This sheet gives you information about how to care for yourself after your procedure. Your health care provider may also give you more specific instructions. If you have problems or questions, contact your health care provider. ?What can I expect after the procedure? ?After the procedure, it is common to have: ?Tiredness. ?Forgetfulness about what happened after the procedure. ?Impaired judgment for important decisions. ?Nausea or vomiting. ?Some difficulty with balance. ?Follow these instructions at home: ?For the time period you were told by your health care provider: ?  ?Rest as needed. ?Do not participate in activities where you could fall or  become injured. ?Do not drive or use machinery. ?Do not drink alcohol. ?Do not take sleeping pills or medicines that cause drowsiness. ?Do not make important decisions or sign legal documents. ?Do not take care of children on your own. ?Eating an

## 2021-09-16 ENCOUNTER — Other Ambulatory Visit: Payer: Self-pay

## 2021-09-16 ENCOUNTER — Encounter (HOSPITAL_COMMUNITY): Payer: Self-pay

## 2021-09-16 ENCOUNTER — Encounter (HOSPITAL_COMMUNITY)
Admission: RE | Admit: 2021-09-16 | Discharge: 2021-09-16 | Disposition: A | Discharge status: Home / Self Care | Payer: Medicare Other | Source: Ambulatory Visit | Attending: Gastroenterology | Admitting: Gastroenterology

## 2021-09-16 DIAGNOSIS — E119 Type 2 diabetes mellitus without complications: Secondary | ICD-10-CM | POA: Insufficient documentation

## 2021-09-16 DIAGNOSIS — Z01818 Encounter for other preprocedural examination: Secondary | ICD-10-CM | POA: Insufficient documentation

## 2021-09-16 HISTORY — DX: Unspecified convulsions: R56.9

## 2021-09-16 HISTORY — DX: Cerebral infarction, unspecified: I63.9

## 2021-09-16 LAB — BASIC METABOLIC PANEL
Anion gap: 11 (ref 5–15)
BUN: 15 mg/dL (ref 8–23)
CO2: 23 mmol/L (ref 22–32)
Calcium: 9.5 mg/dL (ref 8.9–10.3)
Chloride: 103 mmol/L (ref 98–111)
Creatinine, Ser: 0.68 mg/dL (ref 0.44–1.00)
GFR, Estimated: >60 mL/min (ref >60)
Glucose, Bld: 157 mg/dL — ABNORMAL HIGH (ref 70–99)
Potassium: 4 mmol/L (ref 3.5–5.1)
Sodium: 137 mmol/L (ref 135–145)

## 2021-09-21 ENCOUNTER — Encounter (HOSPITAL_COMMUNITY)
Admission: RE | Disposition: A | Discharge status: Home / Self Care | Payer: Self-pay | Source: Home / Self Care | Attending: Gastroenterology

## 2021-09-21 ENCOUNTER — Encounter (HOSPITAL_COMMUNITY): Payer: Self-pay | Admitting: Gastroenterology

## 2021-09-21 ENCOUNTER — Other Ambulatory Visit: Payer: Self-pay

## 2021-09-21 ENCOUNTER — Ambulatory Visit (HOSPITAL_COMMUNITY): Payer: Medicare Other | Admitting: Anesthesiology

## 2021-09-21 ENCOUNTER — Ambulatory Visit (HOSPITAL_COMMUNITY)
Admission: RE | Admit: 2021-09-21 | Discharge: 2021-09-21 | Disposition: A | Discharge status: Home / Self Care | Payer: Medicare Other | Attending: Gastroenterology | Admitting: Gastroenterology

## 2021-09-21 ENCOUNTER — Ambulatory Visit (HOSPITAL_BASED_OUTPATIENT_CLINIC_OR_DEPARTMENT_OTHER): Payer: Medicare Other | Admitting: Anesthesiology

## 2021-09-21 DIAGNOSIS — J449 Chronic obstructive pulmonary disease, unspecified: Secondary | ICD-10-CM

## 2021-09-21 DIAGNOSIS — Z13811 Encounter for screening for lower gastrointestinal disorder: Secondary | ICD-10-CM | POA: Diagnosis not present

## 2021-09-21 DIAGNOSIS — Z6841 Body Mass Index (BMI) 40.0 and over, adult: Secondary | ICD-10-CM | POA: Diagnosis not present

## 2021-09-21 DIAGNOSIS — I10 Essential (primary) hypertension: Secondary | ICD-10-CM | POA: Diagnosis not present

## 2021-09-21 DIAGNOSIS — Z79899 Other long term (current) drug therapy: Secondary | ICD-10-CM | POA: Diagnosis not present

## 2021-09-21 DIAGNOSIS — M1990 Unspecified osteoarthritis, unspecified site: Secondary | ICD-10-CM | POA: Diagnosis not present

## 2021-09-21 DIAGNOSIS — M797 Fibromyalgia: Secondary | ICD-10-CM | POA: Diagnosis not present

## 2021-09-21 DIAGNOSIS — G40909 Epilepsy, unspecified, not intractable, without status epilepticus: Secondary | ICD-10-CM | POA: Diagnosis not present

## 2021-09-21 DIAGNOSIS — F419 Anxiety disorder, unspecified: Secondary | ICD-10-CM | POA: Insufficient documentation

## 2021-09-21 DIAGNOSIS — Z7984 Long term (current) use of oral hypoglycemic drugs: Secondary | ICD-10-CM | POA: Insufficient documentation

## 2021-09-21 DIAGNOSIS — I69354 Hemiplegia and hemiparesis following cerebral infarction affecting left non-dominant side: Secondary | ICD-10-CM | POA: Insufficient documentation

## 2021-09-21 DIAGNOSIS — R1310 Dysphagia, unspecified: Secondary | ICD-10-CM

## 2021-09-21 DIAGNOSIS — E119 Type 2 diabetes mellitus without complications: Secondary | ICD-10-CM | POA: Diagnosis not present

## 2021-09-21 DIAGNOSIS — Z7951 Long term (current) use of inhaled steroids: Secondary | ICD-10-CM | POA: Diagnosis not present

## 2021-09-21 DIAGNOSIS — K219 Gastro-esophageal reflux disease without esophagitis: Secondary | ICD-10-CM | POA: Insufficient documentation

## 2021-09-21 DIAGNOSIS — Z91199 Patient's noncompliance with other medical treatment and regimen due to unspecified reason: Secondary | ICD-10-CM | POA: Diagnosis not present

## 2021-09-21 DIAGNOSIS — Z1211 Encounter for screening for malignant neoplasm of colon: Secondary | ICD-10-CM | POA: Diagnosis not present

## 2021-09-21 DIAGNOSIS — Z794 Long term (current) use of insulin: Secondary | ICD-10-CM | POA: Insufficient documentation

## 2021-09-21 DIAGNOSIS — Z87891 Personal history of nicotine dependence: Secondary | ICD-10-CM | POA: Insufficient documentation

## 2021-09-21 DIAGNOSIS — E669 Obesity, unspecified: Secondary | ICD-10-CM | POA: Diagnosis not present

## 2021-09-21 HISTORY — PX: ESOPHAGOGASTRODUODENOSCOPY (EGD) WITH PROPOFOL: SHX5813

## 2021-09-21 HISTORY — PX: FLEXIBLE SIGMOIDOSCOPY: SHX5431

## 2021-09-21 HISTORY — PX: ESOPHAGEAL DILATION: SHX303

## 2021-09-21 LAB — GLUCOSE, CAPILLARY: Glucose-Capillary: 148 mg/dL — ABNORMAL HIGH (ref 70–99)

## 2021-09-21 SURGERY — SIGMOIDOSCOPY, FLEXIBLE
Anesthesia: General

## 2021-09-21 MED ORDER — LIDOCAINE HCL (CARDIAC) PF 50 MG/5ML IV SOSY
PREFILLED_SYRINGE | INTRAVENOUS | Status: DC | PRN
  Administered 2021-09-21: 50 mg via INTRAVENOUS

## 2021-09-21 MED ORDER — PROPOFOL 10 MG/ML IV BOLUS
INTRAVENOUS | Status: DC | PRN
  Administered 2021-09-21: 20 mg via INTRAVENOUS
  Administered 2021-09-21: 30 mg via INTRAVENOUS
  Administered 2021-09-21: 60 mg via INTRAVENOUS

## 2021-09-21 MED ORDER — LACTATED RINGERS IV SOLN: INTRAVENOUS | Status: DC

## 2021-09-21 NOTE — Anesthesia Procedure Notes (Signed)
Date/Time: 09/21/2021 8:19 AM ?Performed by: Franco Nones, CRNA ?Pre-anesthesia Checklist: Patient identified, Emergency Drugs available, Suction available, Timeout performed and Patient being monitored ?Patient Re-evaluated:Patient Re-evaluated prior to induction ?Oxygen Delivery Method: Non-rebreather mask ? ? ? ? ?

## 2021-09-21 NOTE — Anesthesia Preprocedure Evaluation (Addendum)
Anesthesia Evaluation  ?Patient identified by MRN, date of birth, ID band ?Patient awake ? ? ? ?Reviewed: ?Allergy & Precautions, NPO status , Patient's Chart, lab work & pertinent test results ? ?Airway ?Mallampati: III ? ?TM Distance: >3 FB ?Neck ROM: Full ? ? ? Dental ? ?(+) Dental Advisory Given, Chipped ?  ?Pulmonary ?shortness of breath, with exertion and lying, asthma , COPD, former smoker,  ?  ?Pulmonary exam normal ?breath sounds clear to auscultation ? ? ? ? ? ? Cardiovascular ?Exercise Tolerance: Poor ?hypertension, Pt. on medications ? ?Rhythm:Irregular Rate:Normal ? ?16-Sep-2021 11:16:01 [facility]-OPS ROUTINE RECORD ?[birth year] (48 yr) ?Female Caucasian ?Vent. rate 74 BPM ?PR interval  ms ?QRS duration 92 ms ?QT/QTcB 366/406 ms ?P-R-T axes 36 50 -16 ?Sinus bradycardia Premature atrial complexes in a pattern of bigeminy ?Low voltage QRS ?Nonspecific T wave abnormality ?Abnormal ECG ?When compared with ECG of 28-Jun-2015 20:34, ?Rate slower Premature atrial complexes in a pattern of bigeminy NOW PRESENT ?Confirmed by Glenetta Hew [phone]) on 09/18/2021 9:34:27 AM ? ?  ?Neuro/Psych ?Seizures -, Well Controlled,  PSYCHIATRIC DISORDERS Anxiety Depression  Neuromuscular disease CVA (left sided weakness), Residual Symptoms   ? GI/Hepatic ?Neg liver ROS, GERD  Medicated,  ?Endo/Other  ?diabetes, Well Controlled, Type 2, Oral Hypoglycemic AgentsMorbid obesity ? Renal/GU ?negative Renal ROS  ?negative genitourinary ?  ?Musculoskeletal ? ?(+) Arthritis , Osteoarthritis,  Fibromyalgia -, narcotic dependent ? Abdominal ?(+) + obese,  ?Abdomen: soft. ? ?  ?Peds ?negative pediatric ROS ?(+)  Hematology ?negative hematology ROS ?(+)   ?Anesthesia Other Findings ? ? Reproductive/Obstetrics ?negative OB ROS ? ?  ? ? ? ? ? ? ? ? ? ? ? ? ? ?  ?  ? ? ? ? ? ?Anesthesia Physical ?Anesthesia Plan ? ?ASA: 4 ? ?Anesthesia Plan: General  ? ?Post-op Pain Management: Minimal or no  pain anticipated  ? ?Induction: Intravenous ? ?PONV Risk Score and Plan: Propofol infusion ? ?Airway Management Planned: Nasal Cannula and Natural Airway ? ?Additional Equipment:  ? ?Intra-op Plan:  ? ?Post-operative Plan:  ? ?Informed Consent: I have reviewed the patients History and Physical, chart, labs and discussed the procedure including the risks, benefits and alternatives for the proposed anesthesia with the patient or authorized representative who has indicated his/her understanding and acceptance.  ? ? ? ?Dental advisory given ? ?Plan Discussed with: CRNA and Surgeon ? ?Anesthesia Plan Comments:   ? ? ? ? ? ?Anesthesia Quick Evaluation ? ?

## 2021-09-21 NOTE — Transfer of Care (Signed)
Immediate Anesthesia Transfer of Care Note ? ?Patient: Valerie Wagner ? ?Procedure(s) Performed: FLEXIBLE SIGMOIDOSCOPY ?ESOPHAGOGASTRODUODENOSCOPY (EGD) WITH PROPOFOL ?ESOPHAGEAL DILATION ? ?Patient Location: Short Stay ? ?Anesthesia Type:General ? ?Level of Consciousness: awake and patient cooperative ? ?Airway & Oxygen Therapy: Patient Spontanous Breathing ? ?Post-op Assessment: Report given to RN and Post -op Vital signs reviewed and stable ? ?Post vital signs: Reviewed and stable ? ?Last Vitals:  ?Vitals Value Taken Time  ?BP 108/67 09/21/21 0843  ?Temp 36.5 ?C 09/21/21 0843  ?Pulse 86 09/21/21 0843  ?Resp 15 09/21/21 0843  ?SpO2 96 % 09/21/21 0843  ? ? ?Last Pain:  ?Vitals:  ? 09/21/21 0843  ?TempSrc: Axillary  ?PainSc: 0-No pain  ?   ? ?  ? ?Complications: No notable events documented. ?

## 2021-09-21 NOTE — Op Note (Signed)
[facility] ?Patient Name: Valerie Wagner ?Procedure Date: 09/21/2021 8:32 AM ?MRN: [id number] ?Date of Birth: 10-09-1950 ?Attending MD: Maylon Peppers ,  ?CSN: [id number] ?Age: 71 ?Admit Type: Outpatient ?Procedure:                Flexible Sigmoidoscopy ?Indications:              Screening for polyps in the colon ?Providers:                Maylon Peppers, [place] Page, [place] Cyndi Bender  ?                          Tech, Technician ?Referring MD:              ?Medicines:                Monitored Anesthesia Care ?Complications:            No immediate complications. ?Estimated Blood Loss:     Estimated blood loss: none. ?Procedure:                Pre-Anesthesia Assessment: ?                          - Prior to the procedure, a History and Physical  ?                          was performed, and patient medications, allergies  ?                          and sensitivities were reviewed. The patient's  ?                          tolerance of previous anesthesia was reviewed. ?                          - The risks and benefits of the procedure and the  ?                          sedation options and risks were discussed with the  ?                          patient. All questions were answered and informed  ?                          consent was obtained. ?                          - ASA Grade Assessment: III - A patient with severe  ?                          systemic disease. ?                          After obtaining informed consent, the scope was  ?                          passed under direct vision. The [id number]-[id number]  ?                          (  [id number]) scope was introduced through the anus and  ?                          advanced to the the sigmoid colon. After obtaining  ?                          informed consent, the scope was passed under direct  ?                          vision.The flexible sigmoidoscopy was accomplished  ?                          without difficulty. The patient tolerated the  ?                           procedure well. ?Scope In: 8:36:10 AM ?Scope Out: 8:37:21 AM ?Total Procedure Duration: 0 hours 1 minute 11 seconds  ?Findings: ?     The perianal and digital rectal examinations were normal. ?     Extensive amounts of semi-liquid semi-solid stool was found in the  ?     rectum and in the sigmoid colon, precluding visualization. ?Impression:               - Stool in the rectum and in the sigmoid colon. ?                          - No specimens collected. ?Moderate Sedation: ?     Per Anesthesia Care ?Recommendation:           - Discharge patient to home (ambulatory). ?                          - Resume previous diet. ?                          - Repeat colonoscopy at the next available  ?                          appointment because the bowel preparation was poor  ?                          - will need a 2 day prep. ?Procedure Code(s):        --- Professional --- ?                          [phone], Sigmoidoscopy, flexible; diagnostic,  ?                          including collection of specimen(s) by brushing or  ?                          washing, when performed (separate procedure) ?Diagnosis Code(s):        --- Professional --- ?                          [id number], Encounter for screening for lower  ?  gastrointestinal disorder ?CPT copyright 2019 American Medical Association. All rights reserved. ?The codes documented in this report are preliminary and upon coder review may  ?be revised to meet current compliance requirements. ?Maylon Peppers, MD ?Maylon Peppers,  ?09/21/2021 8:49:23 AM ?This report has been signed electronically. ?Number of Addenda: 0 ?

## 2021-09-21 NOTE — Op Note (Signed)
[facility] ?Patient Name: Valerie Wagner ?Procedure Date: 09/21/2021 8:11 AM ?MRN: [id number] ?Date of Birth: 1950-09-07 ?Attending MD: Maylon Peppers ,  ?CSN: [id number] ?Age: 71 ?Admit Type: Outpatient ?Procedure:                Upper GI endoscopy ?Indications:              Dysphagia ?Providers:                Maylon Peppers, [place] Page, [place] [facility] Cyndi Bender  ?                          Tech, Technician ?Referring MD:              ?Medicines:                Monitored Anesthesia Care ?Complications:            No immediate complications. ?Estimated Blood Loss:     Estimated blood loss: none. ?Procedure:                Pre-Anesthesia Assessment: ?                          - Prior to the procedure, a History and Physical  ?                          was performed, and patient medications, allergies  ?                          and sensitivities were reviewed. The patient's  ?                          tolerance of previous anesthesia was reviewed. ?                          - The risks and benefits of the procedure and the  ?                          sedation options and risks were discussed with the  ?                          patient. All questions were answered and informed  ?                          consent was obtained. ?                          - ASA Grade Assessment: III - A patient with severe  ?                          systemic disease. ?                          After obtaining informed consent, the endoscope was  ?                          passed under direct vision. Throughout the  ?  procedure, the patient's blood pressure, pulse, and  ?                          oxygen saturations were monitored continuously. The  ?                          [id number]) scope was introduced through the  ?                          mouth, and advanced to the second part of duodenum.  ?                          The upper GI endoscopy was accomplished without  ?                           difficulty. The patient tolerated the procedure  ?                          well. ?Scope In: 8:28:18 AM ?Scope Out: 8:30:56 AM ?Total Procedure Duration: 0 hours 2 minutes 38 seconds  ?Findings: ?     The examined esophagus was normal. No intervention was performed given  ?     the presence of food in the stomach. ?     A medium amount of food (residue) was found in the entire examined  ?     stomach. ?     The examined duodenum was normal. ?Impression:               - Normal esophagus. ?                          - A medium amount of food (residue) in the stomach. ?                          - Normal examined duodenum. ?                          - No specimens collected. ?Moderate Sedation: ?     Per Anesthesia Care ?Recommendation:           - Discharge patient to home (ambulatory). ?                          - Resume previous diet. ?                          - Await pathology results. ?                          - Repeat upper endoscopy at the next available  ?                          appointment and if persistent problems swallowing  ?                          given presence of food in stomach - will need to be  ?  on liquids 3 days prior. ?Procedure Code(s):        --- Professional --- ?                          [phone], Esophagogastroduodenoscopy, flexible,  ?                          transoral; diagnostic, including collection of  ?                          specimen(s) by brushing or washing, when performed  ?                          (separate procedure) ?Diagnosis Code(s):        --- Professional --- ?                          R13.10, Dysphagia, unspecified ?CPT copyright 2019 American Medical Association. All rights reserved. ?The codes documented in this report are preliminary and upon coder review may  ?be revised to meet current compliance requirements. ?Maylon Peppers, MD ?Maylon Peppers,  ?09/21/2021 8:43:30 AM ?This report has been signed electronically. ?Number of Addenda: 0 ?

## 2021-09-21 NOTE — Anesthesia Postprocedure Evaluation (Signed)
Anesthesia Post Note ? ?Patient: Valerie Wagner ? ?Procedure(s) Performed: FLEXIBLE SIGMOIDOSCOPY ?ESOPHAGOGASTRODUODENOSCOPY (EGD) WITH PROPOFOL ?ESOPHAGEAL DILATION ? ?Patient location during evaluation: Endoscopy ?Anesthesia Type: General ?Level of consciousness: awake and alert and oriented ?Pain management: pain level controlled ?Vital Signs Assessment: post-procedure vital signs reviewed and stable ?Respiratory status: spontaneous breathing, nonlabored ventilation and respiratory function stable ?Cardiovascular status: blood pressure returned to baseline and stable ?Postop Assessment: no apparent nausea or vomiting ?Anesthetic complications: no ? ? ?No notable events documented. ? ? ?Last Vitals:  ?Vitals:  ? 09/21/21 0719 09/21/21 0843  ?BP: (!) 151/93 108/67  ?Pulse: 80 86  ?Resp: 19 15  ?Temp: 37 ?C 36.5 ?C  ?SpO2:  96%  ?  ?Last Pain:  ?Vitals:  ? 09/21/21 0843  ?TempSrc: Axillary  ?PainSc: 0-No pain  ? ? ?  ?  ?  ?  ?  ?  ? ?Se[place]roth Mcluckie C Irvine Glorioso ? ? ? ? ?

## 2021-09-21 NOTE — Interval H&P Note (Signed)
History and Physical Interval Note: ? ?09/21/2021 ?7:22 AM ? ?Valerie Wagner  has presented today for surgery, with the diagnosis of Screening colonoscopy Dysphagia.  The various methods of treatment have been discussed with the patient and family. After consideration of risks, benefits and other options for treatment, the patient has consented to  Procedure(s) with comments: ?COLONOSCOPY WITH PROPOFOL (N/A) - 805 Patient is in [facility] ?ESOPHAGOGASTRODUODENOSCOPY (EGD) WITH PROPOFOL (N/A) ?ESOPHAGEAL DILATION (N/A) as a surgical intervention.  The patient's history has been reviewed, patient examined, no change in status, stable for surgery.  I have reviewed the patient's chart and labs.  Questions were answered to the patient's satisfaction.   ? ? ?[name] ? ? ?

## 2021-09-21 NOTE — Discharge Instructions (Addendum)
You are being discharged to home.  ?Resume your previous diet.  ?We are waiting for your pathology results.  ?Your physician has recommended a repeat upper endoscopy at the next available appointment and if persistent problems swallowing given presence of food in stomach - will need to be on liquids 3 days prior.  ?Your physician has recommended a repeat colonoscopy at the next available appointment because the bowel preparation was poor - will need a 2 day prep. ?

## 2021-09-27 ENCOUNTER — Encounter (HOSPITAL_COMMUNITY): Payer: Self-pay | Admitting: Gastroenterology

## 2021-09-28 ENCOUNTER — Telehealth (INDEPENDENT_AMBULATORY_CARE_PROVIDER_SITE_OTHER): Payer: Self-pay

## 2021-09-28 NOTE — Telephone Encounter (Signed)
Thanks for the update

## 2021-09-28 NOTE — Telephone Encounter (Signed)
Valerie Wagner friend Marylene Land Bulllins answered the phone when I called to schedule Tcs and Marylene Land stated per Archie Patten she was not going to repeat Tcs at this time.  ?

## 2021-12-21 ENCOUNTER — Other Ambulatory Visit: Payer: Self-pay

## 2021-12-21 ENCOUNTER — Emergency Department (HOSPITAL_COMMUNITY)
Admission: EM | Admit: 2021-12-21 | Discharge: 2021-12-21 | Disposition: A | Discharge status: Home / Self Care | Payer: Medicare Other | Attending: Emergency Medicine | Admitting: Emergency Medicine

## 2021-12-21 ENCOUNTER — Emergency Department (HOSPITAL_COMMUNITY): Payer: Medicare Other

## 2021-12-21 ENCOUNTER — Encounter (HOSPITAL_COMMUNITY): Payer: Self-pay | Admitting: Emergency Medicine

## 2021-12-21 DIAGNOSIS — E119 Type 2 diabetes mellitus without complications: Secondary | ICD-10-CM | POA: Diagnosis not present

## 2021-12-21 DIAGNOSIS — S4991XA Unspecified injury of right shoulder and upper arm, initial encounter: Secondary | ICD-10-CM | POA: Diagnosis present

## 2021-12-21 DIAGNOSIS — I10 Essential (primary) hypertension: Secondary | ICD-10-CM | POA: Insufficient documentation

## 2021-12-21 DIAGNOSIS — S43401A Unspecified sprain of right shoulder joint, initial encounter: Secondary | ICD-10-CM | POA: Insufficient documentation

## 2021-12-21 DIAGNOSIS — W19XXXA Unspecified fall, initial encounter: Secondary | ICD-10-CM | POA: Diagnosis not present

## 2021-12-21 DIAGNOSIS — Z79899 Other long term (current) drug therapy: Secondary | ICD-10-CM | POA: Diagnosis not present

## 2021-12-21 MED ORDER — HYDROCODONE-ACETAMINOPHEN 5-325 MG PO TABS: 1.0000 | ORAL_TABLET | Freq: Four times a day (QID) | ORAL | 0 refills | Status: DC | PRN

## 2021-12-21 MED ORDER — HYDROCODONE-ACETAMINOPHEN 5-325 MG PO TABS
2.0000 | ORAL_TABLET | Freq: Once | ORAL | Status: AC
  Administered 2021-12-21: 2 via ORAL
  Filled 2021-12-21: qty 2

## 2021-12-21 NOTE — Discharge Instructions (Addendum)
Wear arm sling for comfort and support.  Take hydrocodone as prescribed as needed for pain.  Follow-up with your primary doctor if not improving in the next week.

## 2021-12-21 NOTE — ED Triage Notes (Signed)
Pt c/o right shoulder pain since hitting it on the side table while getting into bed a couple of days ago.

## 2021-12-21 NOTE — ED Provider Notes (Signed)
[facility] EMERGENCY DEPARTMENT Provider Note   CSN: [id number] Arrival date & time: 12/21/21  9798     History  Chief Complaint  Patient presents with   Shoulder Pain    Valerie Wagner is a 71 y.o. female.  Patient is a 71 year old female with past medical history of hypertension, hyperlipidemia, type 2 diabetes.  Patient presenting today for evaluation of right shoulder pain.  She tells me she was getting into bed 3 nights ago when she fell and struck her shoulder on the nightstand.  The pain seemed to subside, then returned this evening.  She denies any new injury or trauma.  She denies any numbness or tingling.  Pain is worse with movement and palpation.  There are no alleviating factors.  The history is provided by the patient.  Shoulder Pain Location:  Shoulder Shoulder location:  R shoulder Injury: yes   Time since incident:  3 days Mechanism of injury: fall   Pain details:    Quality:  Aching   Radiates to:  Does not radiate   Severity:  Moderate   Timing:  Constant   Progression:  Worsening      Home Medications Prior to Admission medications   Medication Sig Start Date End Date Taking? Authorizing Provider  ACCU-CHEK AVIVA PLUS test strip  02/05/20   [provider]  acetaminophen (TYLENOL) 325 MG tablet Take 650 mg by mouth every 6 (six) hours as needed for moderate pain.    [provider]  albuterol (VENTOLIN HFA) 108 (90 Base) MCG/ACT inhaler Inhale 2 puffs into the lungs every 6 (six) hours as needed for wheezing or shortness of breath.     [provider]  atorvastatin (LIPITOR) 80 MG tablet Take 80 mg by mouth every evening. 06/17/20   [provider]  Calcium Carbonate Antacid (CALCIUM CARBONATE PO) Take 1 tablet by mouth daily as needed (indigestion).    [provider]  Cranberry 450 MG CAPS Take 450 mg by mouth daily at 6 (six) AM.    [provider]  DULoxetine (CYMBALTA) 60 MG capsule Take 60 mg by  mouth 2 (two) times daily. 06/17/20   [provider]  fluticasone furoate-vilanterol (BREO ELLIPTA) 200-25 MCG/ACT AEPB Inhale 1 puff into the lungs daily.    [provider]  furosemide (LASIX) 20 MG tablet Take 20 mg by mouth every Monday, Wednesday, and Friday.    [provider]  hydrOXYzine (VISTARIL) 25 MG capsule Take 25 mg by mouth at bedtime.    [provider]  LANTUS SOLOSTAR 100 UNIT/ML Solostar Pen 50 Units at bedtime. 02/05/20   [provider]  Lidocaine 4 % PTCH Apply 1 patch topically daily.    [provider]  lisinopril (ZESTRIL) 10 MG tablet Take 10 mg by mouth daily.    [provider]  loperamide (IMODIUM) 2 MG capsule Take 2-4 mg by mouth 4 (four) times daily as needed for diarrhea or loose stools.    [provider]  magnesium oxide (MAG-OX) 400 MG tablet Take 400 mg by mouth daily.    [provider]  Menthol, Topical Analgesic, (BIOFREEZE) 4 % GEL Apply 1 application. topically every 8 (eight) hours as needed (pain).    [provider]  metFORMIN (GLUCOPHAGE) 1000 MG tablet Take 1,000 mg by mouth 2 (two) times daily with a meal.    [provider]  oxybutynin (DITROPAN) 5 MG tablet Take 5 mg by mouth 2 (two) times daily.  [provider]  pantoprazole (PROTONIX) 40 MG tablet Take 40 mg by mouth daily.    [provider]  Polyethyl Glycol-Propyl Glycol (SYSTANE) 0.4-0.3 % SOLN Place 2 drops into both eyes 2 (two) times daily.    [provider]  polyethylene glycol-electrolytes (TRILYTE) 420 g solution Take 4,000 mLs by mouth as directed. 08/24/21   Dolores Frame, MD  pregabalin (LYRICA) 75 MG capsule Take 75 mg by mouth at bedtime.    [provider]  rOPINIRole (REQUIP) 1 MG tablet Take 1 mg by mouth at bedtime.    [provider]  traMADol (ULTRAM) 50 MG tablet Take 50 mg by mouth every 6 (six) hours as needed for  moderate pain. 04/27/20   [provider]  traZODone (DESYREL) 150 MG tablet Take 150 mg by mouth at bedtime.    [provider]      Allergies    Nsaids, Bee venom, Cephalosporins, Chocolate, Darvon [propoxyphene], Ivp dye [iodinated contrast media], Doxycycline, and Penicillins    Review of Systems   Review of Systems  All other systems reviewed and are negative.   Physical Exam Updated Vital Signs BP (!) 145/89   Pulse 69   Temp 98.4 F (36.9 C) (Oral)   Resp 18   Ht 5\' 3"  (1.6 m)   Wt 112 kg   SpO2 94%   BMI 43.74 kg/m  Physical Exam Vitals and nursing note reviewed.  Constitutional:      General: She is not in acute distress.    Appearance: She is well-developed. She is not diaphoretic.  HENT:     Head: Normocephalic and atraumatic.  Cardiovascular:     Rate and Rhythm: Normal rate and regular rhythm.     Heart sounds: No murmur heard.    No friction rub. No gallop.  Pulmonary:     Effort: Pulmonary effort is normal. No respiratory distress.     Breath sounds: Normal breath sounds. No wheezing.  Abdominal:     General: Bowel sounds are normal. There is no distension.     Palpations: Abdomen is soft.     Tenderness: There is no abdominal tenderness.  Musculoskeletal:        General: Normal range of motion.     Cervical back: Normal range of motion and neck supple.     Comments: Right shoulder is grossly normal in appearance.  There is no obvious deformity.  She has pain with palpation over the clavicle and AC joint.  Range of motion limited secondary to pain.  Ulnar and radial pulses are easily palpable and motor and sensation are intact throughout the entire hand.  Skin:    General: Skin is warm and dry.  Neurological:     General: No focal deficit present.     Mental Status: She is alert and oriented to person, place, and time.     ED Results / Procedures / Treatments   Labs (all labs ordered are listed, but only abnormal results are  displayed) Labs Reviewed - No data to display  EKG None  Radiology No results found.  Procedures Procedures    Medications Ordered in ED Medications - No data to display  ED Course/ Medical Decision Making/ A&P  Patient presenting with a right shoulder injury, the events of which are described in the HPI.  Her x-rays are negative for fracture or dislocation.  This will be treated as a sprain/contusion with an arm sling and follow-up as needed.  Final  Clinical Impression(s) / ED Diagnoses Final diagnoses:  None    Rx / DC Orders ED Discharge Orders     None         Geoffery Lyons, MD 12/21/21 [phone]

## 2022-03-16 ENCOUNTER — Emergency Department (HOSPITAL_COMMUNITY): Payer: Medicare Other

## 2022-03-16 ENCOUNTER — Encounter (HOSPITAL_COMMUNITY): Payer: Self-pay

## 2022-03-16 ENCOUNTER — Emergency Department (HOSPITAL_COMMUNITY)
Admission: EM | Admit: 2022-03-16 | Discharge: 2022-03-17 | Disposition: A | Discharge status: Home / Self Care | Payer: Medicare Other | Attending: Emergency Medicine | Admitting: Emergency Medicine

## 2022-03-16 ENCOUNTER — Other Ambulatory Visit: Payer: Self-pay

## 2022-03-16 DIAGNOSIS — Z794 Long term (current) use of insulin: Secondary | ICD-10-CM | POA: Diagnosis not present

## 2022-03-16 DIAGNOSIS — J449 Chronic obstructive pulmonary disease, unspecified: Secondary | ICD-10-CM | POA: Diagnosis not present

## 2022-03-16 DIAGNOSIS — M25562 Pain in left knee: Secondary | ICD-10-CM | POA: Insufficient documentation

## 2022-03-16 DIAGNOSIS — W19XXXA Unspecified fall, initial encounter: Secondary | ICD-10-CM | POA: Insufficient documentation

## 2022-03-16 DIAGNOSIS — I10 Essential (primary) hypertension: Secondary | ICD-10-CM | POA: Diagnosis not present

## 2022-03-16 DIAGNOSIS — Z7984 Long term (current) use of oral hypoglycemic drugs: Secondary | ICD-10-CM | POA: Insufficient documentation

## 2022-03-16 DIAGNOSIS — E1140 Type 2 diabetes mellitus with diabetic neuropathy, unspecified: Secondary | ICD-10-CM | POA: Insufficient documentation

## 2022-03-16 DIAGNOSIS — M545 Low back pain, unspecified: Secondary | ICD-10-CM | POA: Diagnosis present

## 2022-03-16 DIAGNOSIS — Z7951 Long term (current) use of inhaled steroids: Secondary | ICD-10-CM | POA: Diagnosis not present

## 2022-03-16 DIAGNOSIS — M25551 Pain in right hip: Secondary | ICD-10-CM | POA: Diagnosis not present

## 2022-03-16 DIAGNOSIS — Z79899 Other long term (current) drug therapy: Secondary | ICD-10-CM | POA: Diagnosis not present

## 2022-03-16 DIAGNOSIS — Y92129 Unspecified place in nursing home as the place of occurrence of the external cause: Secondary | ICD-10-CM | POA: Diagnosis not present

## 2022-03-16 LAB — BASIC METABOLIC PANEL
Anion gap: 6 (ref 5–15)
BUN: 19 mg/dL (ref 8–23)
CO2: 31 mmol/L (ref 22–32)
Calcium: 10.1 mg/dL (ref 8.9–10.3)
Chloride: 103 mmol/L (ref 98–111)
Creatinine, Ser: 0.89 mg/dL (ref 0.44–1.00)
GFR, Estimated: >60 mL/min (ref >60)
Glucose, Bld: 114 mg/dL — ABNORMAL HIGH (ref 70–99)
Potassium: 4.2 mmol/L (ref 3.5–5.1)
Sodium: 140 mmol/L (ref 135–145)

## 2022-03-16 LAB — CBC
HCT: 38.8 % (ref 36.0–46.0)
Hemoglobin: 12.4 g/dL (ref 12.0–15.0)
MCH: 30 pg (ref 26.0–34.0)
MCHC: 32 g/dL (ref 30.0–36.0)
MCV: 93.7 fL (ref 80.0–100.0)
Platelets: 328 10*3/uL (ref 150–400)
RBC: 4.14 MIL/uL (ref 3.87–5.11)
RDW: 12.8 % (ref 11.5–15.5)
WBC: 14.6 10*3/uL — ABNORMAL HIGH (ref 4.0–10.5)
nRBC: 0 % (ref 0.0–0.2)

## 2022-03-16 NOTE — ED Provider Notes (Signed)
[facility] EMERGENCY DEPARTMENT Provider Note   CSN: [id number] Arrival date & time: 03/16/22  1848     History  Chief Complaint  Patient presents with   Lytle Michaels    Valerie Wagner is a 71 y.o. female.   Fall     Patient has a history of chest pain hyperlipidemia hypertension, COPD, GERD, IBS, fibromyalgia, neuropathy, diabetes, stroke.  Patient states she presents to the emergency room for a fall injuring her knee hip and back.  Patient states she thinks her knee just gave out.  She ended up falling forward.  She denies hitting her head or losing consciousness.  She denies having weakness or syncope.  Patient states since the fall she has been having some pain in her hip and her knee.  She also has some pain in her lower back.  Patient resides in a nursing facility  Home Medications Prior to Admission medications   Medication Sig Start Date End Date Taking? Authorizing Provider  ACCU-CHEK AVIVA PLUS test strip  02/05/20   [provider]  acetaminophen (TYLENOL) 325 MG tablet Take 650 mg by mouth every 6 (six) hours as needed for moderate pain.    [provider]  albuterol (VENTOLIN HFA) 108 (90 Base) MCG/ACT inhaler Inhale 2 puffs into the lungs every 6 (six) hours as needed for wheezing or shortness of breath.     [provider]  atorvastatin (LIPITOR) 80 MG tablet Take 80 mg by mouth every evening. 06/17/20   [provider]  Calcium Carbonate Antacid (CALCIUM CARBONATE PO) Take 1 tablet by mouth daily as needed (indigestion).    [provider]  Cranberry 450 MG CAPS Take 450 mg by mouth daily at 6 (six) AM.    [provider]  DULoxetine (CYMBALTA) 60 MG capsule Take 60 mg by mouth 2 (two) times daily. 06/17/20   [provider]  fluticasone furoate-vilanterol (BREO ELLIPTA) 200-25 MCG/ACT AEPB Inhale 1 puff into the lungs daily.    [provider]  furosemide (LASIX) 20 MG tablet Take 20 mg by mouth every  Monday, Wednesday, and Friday.    [provider]  HYDROcodone-acetaminophen (NORCO) 5-325 MG tablet Take 1-2 tablets by mouth every 6 (six) hours as needed. 12/21/21   Veryl Speak, MD  hydrOXYzine (VISTARIL) 25 MG capsule Take 25 mg by mouth at bedtime.    [provider]  LANTUS SOLOSTAR 100 UNIT/ML Solostar Pen 50 Units at bedtime. 02/05/20   [provider]  Lidocaine 4 % PTCH Apply 1 patch topically daily.    [provider]  lisinopril (ZESTRIL) 10 MG tablet Take 10 mg by mouth daily.    [provider]  loperamide (IMODIUM) 2 MG capsule Take 2-4 mg by mouth 4 (four) times daily as needed for diarrhea or loose stools.    [provider]  magnesium oxide (MAG-OX) 400 MG tablet Take 400 mg by mouth daily.    [provider]  Menthol, Topical Analgesic, (BIOFREEZE) 4 % GEL Apply 1 application. topically every 8 (eight) hours as needed (pain).    [provider]  metFORMIN (GLUCOPHAGE) 1000 MG tablet Take 1,000 mg by mouth 2 (two) times daily with a meal.    [provider]  oxybutynin (DITROPAN) 5 MG tablet Take 5 mg by mouth 2 (two) times daily.    [provider]  pantoprazole (PROTONIX) 40 MG tablet Take 40 mg by mouth daily.    [provider]  Polyethyl Glycol-Propyl Glycol (  SYSTANE) 0.4-0.3 % SOLN Place 2 drops into both eyes 2 (two) times daily.    [provider]  polyethylene glycol-electrolytes (TRILYTE) 420 g solution Take 4,000 mLs by mouth as directed. 08/24/21   Dolores Frame, MD  pregabalin (LYRICA) 75 MG capsule Take 75 mg by mouth at bedtime.    [provider]  rOPINIRole (REQUIP) 1 MG tablet Take 1 mg by mouth at bedtime.    [provider]  traMADol (ULTRAM) 50 MG tablet Take 50 mg by mouth every 6 (six) hours as needed for moderate pain. 04/27/20   [provider]  traZODone (DESYREL) 150 MG tablet Take 150 mg by mouth at bedtime.     [provider]      Allergies    Nsaids, Bee venom, Cephalosporins, Chocolate, Darvon [propoxyphene], Ivp dye [iodinated contrast media], Doxycycline, and Penicillins    Review of Systems   Review of Systems  Physical Exam Updated Vital Signs BP (!) 151/80   Pulse (!) 58   Temp 98.5 F (36.9 C)   Resp 20   Ht 1.6 m (5\' 3" )   Wt 124.8 kg   SpO2 95%   BMI 48.73 kg/m  Physical Exam Vitals and nursing note reviewed.  Constitutional:      General: She is not in acute distress.    Appearance: She is well-developed. She is obese.  HENT:     Head: Normocephalic and atraumatic.     Right Ear: External ear normal.     Left Ear: External ear normal.  Eyes:     General: No scleral icterus.       Right eye: No discharge.        Left eye: No discharge.     Conjunctiva/sclera: Conjunctivae normal.  Neck:     Trachea: No tracheal deviation.  Cardiovascular:     Rate and Rhythm: Normal rate and regular rhythm.  Pulmonary:     Effort: Pulmonary effort is normal. No respiratory distress.     Breath sounds: Normal breath sounds. No stridor. No wheezing or rales.  Abdominal:     General: Bowel sounds are normal. There is no distension.     Palpations: Abdomen is soft.     Tenderness: There is no abdominal tenderness. There is no guarding or rebound.  Musculoskeletal:        General: No deformity.     Cervical back: Normal and neck supple.     Thoracic back: Normal.     Lumbar back: Tenderness present.     Right hip: Tenderness present. No deformity. Normal range of motion.     Right knee: No tenderness.  Skin:    General: Skin is warm and dry.     Findings: No rash.  Neurological:     General: No focal deficit present.     Mental Status: She is alert.     Cranial Nerves: No cranial nerve deficit (no facial droop, extraocular movements intact, no slurred speech).     Sensory: No sensory deficit.     Motor: No abnormal muscle tone or seizure activity.      Coordination: Coordination normal.  Psychiatric:        Mood and Affect: Mood normal.     ED Results / Procedures / Treatments   Labs (all labs ordered are listed, but only abnormal results are displayed) Labs Reviewed  CBC - Abnormal; Notable for the following components:      Result Value   WBC 14.6 (*)  All other components within normal limits  BASIC METABOLIC PANEL - Abnormal; Notable for the following components:   Glucose, Bld 114 (*)    All other components within normal limits    EKG None  Radiology DG Shoulder Right  Result Date: 03/16/2022 CLINICAL DATA:  Fall, pain EXAM: RIGHT SHOULDER - 2+ VIEW COMPARISON:  None Available. FINDINGS: There is no evidence of fracture or dislocation. Glenohumeral joint space narrowing with marginal spurring. Mild acromioclavicular osteoarthritis. Soft tissues are unremarkable. IMPRESSION: 1. No acute fracture or dislocation. 2. Mild acromioclavicular and glenohumeral osteoarthritis. Electronically Signed   By: Larose Hires D.O.   On: 03/16/2022 23:13   DG Lumbar Spine Complete  Result Date: 03/16/2022 CLINICAL DATA:  Fall with pain. EXAM: LUMBAR SPINE - COMPLETE 4+ VIEW COMPARISON:  12/08/2009. FINDINGS: There is no evidence of lumbar spine fracture. Alignment is normal. Intervertebral disc spaces maintained. Multilevel degenerative endplate changes and facet arthropathy is noted. There is atherosclerotic calcification of the aorta. Surgical clips are present in the right upper quadrant. IMPRESSION: 1. No acute fracture. 2. Degenerative changes in the lumbar spine. Electronically Signed   By: Thornell Sartorius M.D.   On: 03/16/2022 21:52   DG Knee Complete 4 Views Left  Result Date: 03/16/2022 CLINICAL DATA:  Fall with pain. EXAM: LEFT KNEE - COMPLETE 4+ VIEW COMPARISON:  09/23/2020 FINDINGS: No acute fracture or dislocation. There is a trace suprapatellar joint effusion. Mild-to-moderate tricompartmental degenerative changes. Soft tissues  are unremarkable. IMPRESSION: 1. No acute fracture or dislocation. 2. Mild-to-moderate tricompartmental degenerative changes. 3. Trace suprapatellar joint effusion. Electronically Signed   By: Thornell Sartorius M.D.   On: 03/16/2022 21:51   DG Hand Complete Left  Result Date: 03/16/2022 CLINICAL DATA:  Fall with pain. EXAM: LEFT HAND - COMPLETE 3+ VIEW COMPARISON:  09/23/2020. FINDINGS: There is no evidence of fracture or dislocation. Degenerative changes are noted at the wrist. Soft tissues are unremarkable. IMPRESSION: No acute fracture or dislocation. Electronically Signed   By: Thornell Sartorius M.D.   On: 03/16/2022 21:49   DG Hip Unilat W or Wo Pelvis 2-3 Views Right  Result Date: 03/16/2022 CLINICAL DATA:  Fall with pain. EXAM: DG HIP (WITH OR WITHOUT PELVIS) 2-3V RIGHT COMPARISON:  09/23/2020. FINDINGS: There is no evidence of hip fracture or dislocation. There is no evidence of arthropathy or other focal bone abnormality. IMPRESSION: Negative. Electronically Signed   By: Thornell Sartorius M.D.   On: 03/16/2022 21:48    Procedures Procedures    Medications Ordered in ED Medications - No data to display  ED Course/ Medical Decision Making/ A&P Clinical Course as of 03/16/22 2344  Wed Mar 16, 2022  2224 Basic metabolic panel(!) Normal [JK]  2224 CBC(!) White blood cell count elevated [JK]  2224 X-rays without signs of fracture or dislocation.  Arthritic changes noted in the knee [JK]  2251 Patient states she is having pain in her shoulder right now.  She requests a shoulder x-ray [JK]    Clinical Course User Index [JK] Linwood Dibbles, MD                           Medical Decision Making Patient presented with a mechanical fall.  Denied any syncope, or weakness  Problems Addressed: Fall, initial encounter: acute illness or injury that poses a threat to life or bodily functions  Amount and/or Complexity of Data Reviewed Labs: ordered. Decision-making details documented in ED  Course. Radiology: ordered  and independent interpretation performed.   Patient had a mechanical fall.  X-rays fortunately do not show signs of any serious injury.  No signs of anemia or dehydration.    Evaluation and diagnostic testing in the emergency department does not suggest an emergent condition requiring admission or immediate intervention beyond what has been performed at this time.  The patient is safe for discharge and has been instructed to return immediately for worsening symptoms, change in symptoms or any other concerns.        Final Clinical Impression(s) / ED Diagnoses Final diagnoses:  Fall, initial encounter    Rx / DC Orders ED Discharge Orders     None         Linwood Dibbles, MD 03/16/22 2344

## 2022-03-16 NOTE — Discharge Instructions (Signed)
The x-rays did not show any signs of serious injuries.  Take Tylenol as needed for pain and discomfort.  Follow-up with your primary doctor to be rechecked

## 2022-03-16 NOTE — ED Notes (Signed)
Discharge instructions were reviewed with the patient and to Rodena Piety with [facility]. They advised they would send someone to pick the patient up for discharge.

## 2022-03-16 NOTE — ED Triage Notes (Signed)
Patient states she thinks her right knee gave out on her and she fell, hurting her right knee and right buttock. Patient denies any dizziness or other symptoms prior to the fall. Patient normally walks with a walker. Patient denies hitting her head. Patient via EMS from [facility].

## 2022-04-13 ENCOUNTER — Encounter (HOSPITAL_COMMUNITY): Payer: Self-pay

## 2022-04-13 ENCOUNTER — Other Ambulatory Visit: Payer: Self-pay

## 2022-04-13 ENCOUNTER — Emergency Department (HOSPITAL_COMMUNITY)
Admission: EM | Admit: 2022-04-13 | Discharge: 2022-04-13 | Disposition: A | Discharge status: Home / Self Care | Payer: Medicare Other | Attending: Student | Admitting: Student

## 2022-04-13 ENCOUNTER — Emergency Department (HOSPITAL_COMMUNITY): Payer: Medicare Other

## 2022-04-13 DIAGNOSIS — M5442 Lumbago with sciatica, left side: Secondary | ICD-10-CM | POA: Diagnosis not present

## 2022-04-13 DIAGNOSIS — D72829 Elevated white blood cell count, unspecified: Secondary | ICD-10-CM | POA: Insufficient documentation

## 2022-04-13 DIAGNOSIS — M545 Low back pain, unspecified: Secondary | ICD-10-CM | POA: Diagnosis present

## 2022-04-13 DIAGNOSIS — J449 Chronic obstructive pulmonary disease, unspecified: Secondary | ICD-10-CM | POA: Diagnosis not present

## 2022-04-13 DIAGNOSIS — J45909 Unspecified asthma, uncomplicated: Secondary | ICD-10-CM | POA: Insufficient documentation

## 2022-04-13 LAB — BASIC METABOLIC PANEL
Anion gap: 8 (ref 5–15)
BUN: 15 mg/dL (ref 8–23)
CO2: 28 mmol/L (ref 22–32)
Calcium: 9.6 mg/dL (ref 8.9–10.3)
Chloride: 103 mmol/L (ref 98–111)
Creatinine, Ser: 0.78 mg/dL (ref 0.44–1.00)
GFR, Estimated: >60 mL/min (ref >60)
Glucose, Bld: 69 mg/dL — ABNORMAL LOW (ref 70–99)
Potassium: 3.9 mmol/L (ref 3.5–5.1)
Sodium: 139 mmol/L (ref 135–145)

## 2022-04-13 LAB — CBC
HCT: 42.2 % (ref 36.0–46.0)
Hemoglobin: 13.6 g/dL (ref 12.0–15.0)
MCH: 29.7 pg (ref 26.0–34.0)
MCHC: 32.2 g/dL (ref 30.0–36.0)
MCV: 92.1 fL (ref 80.0–100.0)
Platelets: 342 10*3/uL (ref 150–400)
RBC: 4.58 MIL/uL (ref 3.87–5.11)
RDW: 13.2 % (ref 11.5–15.5)
WBC: 15 10*3/uL — ABNORMAL HIGH (ref 4.0–10.5)
nRBC: 0 % (ref 0.0–0.2)

## 2022-04-13 MED ORDER — PREDNISONE 20 MG PO TABS: 40.0000 mg | ORAL_TABLET | Freq: Every day | ORAL | 0 refills | Status: DC

## 2022-04-13 MED ORDER — PREDNISONE 50 MG PO TABS
60.0000 mg | ORAL_TABLET | Freq: Once | ORAL | Status: AC
  Administered 2022-04-13: 60 mg via ORAL
  Filled 2022-04-13: qty 1

## 2022-04-13 MED ORDER — METHOCARBAMOL 500 MG PO TABS: 500.0000 mg | ORAL_TABLET | Freq: Two times a day (BID) | ORAL | 0 refills | Status: DC

## 2022-04-13 MED ORDER — OXYCODONE-ACETAMINOPHEN 5-325 MG PO TABS: 1.0000 | ORAL_TABLET | Freq: Four times a day (QID) | ORAL | 0 refills | Status: DC | PRN

## 2022-04-13 MED ORDER — GADOBUTROL 1 MMOL/ML IV SOLN
10.0000 mL | Freq: Once | INTRAVENOUS | Status: AC | PRN
  Administered 2022-04-13: 10 mL via INTRAVENOUS

## 2022-04-13 MED ORDER — LORAZEPAM 2 MG/ML IJ SOLN
2.0000 mg | Freq: Once | INTRAMUSCULAR | Status: AC
  Administered 2022-04-13: 2 mg via INTRAVENOUS
  Filled 2022-04-13: qty 1

## 2022-04-13 MED ORDER — MORPHINE SULFATE (PF) 4 MG/ML IV SOLN: 4.0000 mg | Freq: Once | INTRAVENOUS | Status: DC

## 2022-04-13 NOTE — ED Notes (Signed)
Pt reports she had an episode of urinary incontinence yesterday evening following the onset of the pain.

## 2022-04-13 NOTE — ED Provider Notes (Signed)
[facility] DEPARTMENT Provider Note   CSN: [id number] Arrival date & time: 04/13/22  1746     History  Chief Complaint  Patient presents with   Back Pain    Valerie Wagner is a 71 y.o. female with past medical history significant for previous melanoma, hyperlipidemia, COPD, obesity, acid reflux, asthma, diabetes who presents with concern for low back pain, with radiation down left leg, some paresthesia down left leg, and severe back pain.  She reports that she has not taken anything for the pain prior to arrival.  Patient reports that she was concerned because she was not able to feel that she needed to urinate this morning and urinated over herself in her bed.  Patient reports pain worse with movement.  She denies any groin numbness.  She denies any IV drug use, reports that her previous melanoma is with no signs of recurrence, and negative lymph nodes as far she is aware.  She denies chronic corticosteroid use.   Back Pain      Home Medications Prior to Admission medications   Medication Sig Start Date End Date Taking? Authorizing Provider  methocarbamol (ROBAXIN) 500 MG tablet Take 1 tablet (500 mg total) by mouth 2 (two) times daily. 04/13/22  Yes Rainey Kahrs H, PA-C  oxyCODONE-acetaminophen (PERCOCET/ROXICET) 5-325 MG tablet Take 1 tablet by mouth every 6 (six) hours as needed for severe pain. 04/13/22  Yes Lenell Lama H, PA-C  predniSONE (DELTASONE) 20 MG tablet Take 2 tablets (40 mg total) by mouth daily. 04/13/22  Yes Arlina Sabina H, PA-C  ACCU-CHEK AVIVA PLUS test strip  02/05/20   [provider]  acetaminophen (TYLENOL) 325 MG tablet Take 650 mg by mouth every 6 (six) hours as needed for moderate pain.    [provider]  albuterol (VENTOLIN HFA) 108 (90 Base) MCG/ACT inhaler Inhale 2 puffs into the lungs every 6 (six) hours as needed for wheezing or shortness of breath.     [provider]  atorvastatin (LIPITOR)  80 MG tablet Take 80 mg by mouth every evening. 06/17/20   [provider]  Calcium Carbonate Antacid (CALCIUM CARBONATE PO) Take 1 tablet by mouth daily as needed (indigestion).    [provider]  Cranberry 450 MG CAPS Take 450 mg by mouth daily at 6 (six) AM.    [provider]  DULoxetine (CYMBALTA) 60 MG capsule Take 60 mg by mouth 2 (two) times daily. 06/17/20   [provider]  fluticasone furoate-vilanterol (BREO ELLIPTA) 200-25 MCG/ACT AEPB Inhale 1 puff into the lungs daily.    [provider]  furosemide (LASIX) 20 MG tablet Take 20 mg by mouth every Monday, Wednesday, and Friday.    [provider]  HYDROcodone-acetaminophen (NORCO) 5-325 MG tablet Take 1-2 tablets by mouth every 6 (six) hours as needed. 12/21/21   Geoffery Lyons, MD  hydrOXYzine (VISTARIL) 25 MG capsule Take 25 mg by mouth at bedtime.    [provider]  LANTUS SOLOSTAR 100 UNIT/ML Solostar Pen 50 Units at bedtime. 02/05/20   [provider]  Lidocaine 4 % PTCH Apply 1 patch topically daily.    [provider]  lisinopril (ZESTRIL) 10 MG tablet Take 10 mg by mouth daily.    [provider]  loperamide (IMODIUM) 2 MG capsule Take 2-4 mg by mouth 4 (four) times daily as needed for diarrhea or loose stools.    [provider]  magnesium oxide (MAG-OX) 400 MG tablet Take 400 mg  by mouth daily.    [provider]  Menthol, Topical Analgesic, (BIOFREEZE) 4 % GEL Apply 1 application. topically every 8 (eight) hours as needed (pain).    [provider]  metFORMIN (GLUCOPHAGE) 1000 MG tablet Take 1,000 mg by mouth 2 (two) times daily with a meal.    [provider]  oxybutynin (DITROPAN) 5 MG tablet Take 5 mg by mouth 2 (two) times daily.    [provider]  pantoprazole (PROTONIX) 40 MG tablet Take 40 mg by mouth daily.    [provider]  Polyethyl Glycol-Propyl Glycol (SYSTANE) 0.4-0.3 %  SOLN Place 2 drops into both eyes 2 (two) times daily.    [provider]  polyethylene glycol-electrolytes (TRILYTE) 420 g solution Take 4,000 mLs by mouth as directed. 08/24/21   Harvel Quale, MD  pregabalin (LYRICA) 75 MG capsule Take 75 mg by mouth at bedtime.    [provider]  rOPINIRole (REQUIP) 1 MG tablet Take 1 mg by mouth at bedtime.    [provider]  traMADol (ULTRAM) 50 MG tablet Take 50 mg by mouth every 6 (six) hours as needed for moderate pain. 04/27/20   [provider]  traZODone (DESYREL) 150 MG tablet Take 150 mg by mouth at bedtime.    [provider]      Allergies    Nsaids, Bee venom, Cephalosporins, Chocolate, Darvon [propoxyphene], Ivp dye [iodinated contrast media], Doxycycline, and Penicillins    Review of Systems   Review of Systems  Musculoskeletal:  Positive for back pain.  All other systems reviewed and are negative.   Physical Exam Updated Vital Signs BP 128/61 (BP Location: Right Arm)   Pulse (!) 55   Temp 98.2 F (36.8 C) (Oral)   Resp 20   Ht 5\' 3"  (1.6 m)   Wt 102.1 kg   SpO2 95%   BMI 39.86 kg/m  Physical Exam Vitals and nursing note reviewed.  Constitutional:      General: She is not in acute distress.    Appearance: Normal appearance. She is obese.  HENT:     Head: Normocephalic and atraumatic.  Eyes:     General:        Right eye: No discharge.        Left eye: No discharge.  Cardiovascular:     Rate and Rhythm: Normal rate and regular rhythm.     Heart sounds: No murmur heard.    No friction rub. No gallop.  Pulmonary:     Effort: Pulmonary effort is normal.     Breath sounds: Normal breath sounds.  Abdominal:     General: Bowel sounds are normal.     Palpations: Abdomen is soft.  Musculoskeletal:     Comments: Decreased strength to flexion, extension at the left hip secondary to pain, effort, significant tenderness midline lumbar spine with radiation to bilateral  paraspinous muscles, worse on the left.  Positive straight leg raise on the left.  Skin:    General: Skin is warm and dry.     Capillary Refill: Capillary refill takes less than 2 seconds.  Neurological:     Mental Status: She is alert and oriented to person, place, and time.     Comments: No sensory deficit noted in the inner thigh, groin area on my exam  Psychiatric:        Mood and Affect: Mood normal.        Behavior: Behavior normal.     ED  Results / Procedures / Treatments   Labs (all labs ordered are listed, but only abnormal results are displayed) Labs Reviewed  CBC - Abnormal; Notable for the following components:      Result Value   WBC 15.0 (*)    All other components within normal limits  BASIC METABOLIC PANEL - Abnormal; Notable for the following components:   Glucose, Bld 69 (*)    All other components within normal limits  URINALYSIS, ROUTINE W REFLEX MICROSCOPIC    EKG None  Radiology MR Lumbar Spine W Wo Contrast  Result Date: 04/13/2022 CLINICAL DATA:  Low back pain.  Rule out cauda equina syndrome. EXAM: MRI LUMBAR SPINE WITHOUT AND WITH CONTRAST TECHNIQUE: Multiplanar and multiecho pulse sequences of the lumbar spine were obtained without and with intravenous contrast. CONTRAST:  14mL GADAVIST GADOBUTROL 1 MMOL/ML IV SOLN COMPARISON:  Lumbar radiographs 03/16/2022 FINDINGS: Segmentation:  5 lumbar vertebra.  Lowest disc space L5-S1 Image quality degraded by moderate motion. Alignment:  Normal Vertebrae:  Negative for fracture or mass. Conus medullaris and cauda equina: Conus extends to the L1-2 level. Conus and cauda equina appear normal. Paraspinal and other soft tissues: Negative for paraspinous mass or adenopathy Disc levels: T12-L1: Small central disc protrusion. Negative for stenosis on sagittal images L1-2: Negative L2-3: Mild disc bulging.  Negative for stenosis L3-4: Mild disc bulging and mild facet degeneration. Negative for stenosis L4-5: Small central  disc protrusion and bilateral facet degeneration. Borderline spinal stenosis. Mild subarticular stenosis left greater than right L5-S1: Negative for stenosis IMPRESSION: 1. Image quality degraded by moderate motion. 2. Small central disc protrusion T12-L1. 3. Small central disc protrusion L4-5 with mild subarticular stenosis left greater than right. Borderline spinal stenosis. Electronically Signed   By: Marlan Palau M.D.   On: 04/13/2022 19:29    Procedures Procedures    Medications Ordered in ED Medications  morphine (PF) 4 MG/ML injection 4 mg (has no administration in time range)  gadobutrol (GADAVIST) 1 MMOL/ML injection 10 mL (10 mLs Intravenous Contrast Given 04/13/22 1842)  LORazepam (ATIVAN) injection 2 mg (2 mg Intravenous Given 04/13/22 1817)    ED Course/ Medical Decision Making/ A&P                           Medical Decision Making Amount and/or Complexity of Data Reviewed Labs: ordered. Radiology: ordered.  Risk Prescription drug management.   This patient is a 71 y.o. female who presents to the ED for concern of left-sided low back pain after twisting injury, urinary incontinence this involves an extensive number of treatment options, and is a complaint that carries with it a high risk of complications and morbidity. The emergent differential diagnosis prior to evaluation includes, but is not limited to,  sciatica, compression fracture of lumbar spine, cauda equina, or other central cord disease.   This is not an exhaustive differential.   Past Medical History / Co-morbidities / Social History: previous melanoma, hyperlipidemia, COPD, obesity, acid reflux, asthma, diabetes  Additional history: Chart reviewed. Pertinent results include: Reviewed lab work, imaging from previous emergency department visits  Physical Exam: Physical exam performed. The pertinent findings include: Patient with significant lumbar spinal tenderness including paraspinous muscle tenderness  bilaterally, positive straight leg raise on left, and midline cervical spinal tenderness as well, she is slightly decreased strength of the left leg, likely secondary to pain/effort, overall normal coordination  Lab Tests: I ordered, and personally interpreted labs.  The pertinent results  include:  Moderate leukocytosis white blood cells 15.  Otherwise no acute abnormalities of CBC, BMP notable for mild hyperglycemia glucose 69, will encourage oral intake.  Patient did not provide a urine sample but denies any significant dysuria, dysuria reporting that her incontinence is secondary to her back pain.   Imaging Studies: I ordered imaging studies including MRI lumbar spine with and without contrast. I independently visualized and interpreted imaging which showed T12-L1 and L4-L5 disc protrusion. I agree with the radiologist interpretation.   Medications: I ordered medication including Ativan for anxiety/pain. Reevaluation of the patient after these medicines showed that the patient improved. I have reviewed the patients home medicines and have made adjustments as needed.   Disposition: After consideration of the diagnostic results and the patients response to treatment, I feel that patient's symptoms are consistent with sciatica, and lumbar strain, encouraged steroid burst, will provide muscle relaxants, and very short prescription for stronger narcotic pain medication, encouraged PCP, orthopedic follow-up.Marland Kitchen   emergency department workup does not suggest an emergent condition requiring admission or immediate intervention beyond what has been performed at this time. The plan is: as above. The patient is safe for discharge and has been instructed to return immediately for worsening symptoms, change in symptoms or any other concerns.  I discussed this case with my attending physician Dr. Posey Rea who cosigned this note including patient's presenting symptoms, physical exam, and planned diagnostics and  interventions. Attending physician stated agreement with plan or made changes to plan which were implemented.    Final Clinical Impression(s) / ED Diagnoses Final diagnoses:  Acute left-sided low back pain with left-sided sciatica    Rx / DC Orders ED Discharge Orders          Ordered    oxyCODONE-acetaminophen (PERCOCET/ROXICET) 5-325 MG tablet  Every 6 hours PRN        04/13/22 2045    methocarbamol (ROBAXIN) 500 MG tablet  2 times daily        04/13/22 2045    predniSONE (DELTASONE) 20 MG tablet  Daily        04/13/22 2100              [place] 04/13/22 2101    Glendora Score, MD 04/14/22 [phone]

## 2022-04-13 NOTE — ED Notes (Signed)
Pt is resting comfortably. Eyes closed and appears relaxed. No signs of distress.

## 2022-04-13 NOTE — ED Notes (Signed)
RN called [facility] at this time to transport her home. Staff at facility reported they have no one to come get her. RN insisted that they see what they can do to make arrangements.

## 2022-04-13 NOTE — ED Notes (Addendum)
Attempted to ambulate patient to reassess pain upon ambulation. Pt states she does not walk

## 2022-04-13 NOTE — ED Notes (Signed)
Pt's ride outside.

## 2022-04-13 NOTE — Discharge Instructions (Addendum)
Please use Tylenol or ibuprofen for pain.  You may use 600 mg ibuprofen every 6 hours or 1000 mg of Tylenol every 6 hours.  You may choose to alternate between the 2.  This would be most effective.  Not to exceed 4 g of Tylenol within 24 hours.  Not to exceed 3200 mg ibuprofen 24 hours.  You can use the muscle relaxant I am prescribing in addition to the above to help with any breakthrough pain.  You can take it up to twice daily.  It is safe to take at night, but I would be cautious taking it during the day as it can cause some drowsiness.  Make sure that you are feeling awake and alert before you get behind the wheel of a car or operate a motor vehicle.  It is not a narcotic pain medication so you are able to take it if it is not making you drowsy and still pilot a vehicle or machinery safely.  You can use the stronger medication for severe breakthrough pain in place of your tramadol at home.

## 2022-04-13 NOTE — ED Notes (Signed)
Pt given meal while she waits for ride. Appears to be in no distress.

## 2022-04-13 NOTE — ED Notes (Signed)
Patient transported to MRI 

## 2022-04-13 NOTE — ED Notes (Signed)
Pt ambulated with walker down hall. Placed in wheelchair. Customer service manager to send for ride.

## 2022-04-13 NOTE — ED Triage Notes (Signed)
Pt BIB [facility] coming from home. Pt twisted her back last night while doing laundry. Pt with severe back pain that radiated down L leg. Pt reports paresthesia in L leg along with severe back pain.

## 2022-04-13 NOTE — ED Notes (Signed)
Agricultural consultant on phone with [facility] of facility.

## 2022-07-09 ENCOUNTER — Encounter (HOSPITAL_COMMUNITY): Payer: Self-pay

## 2022-07-09 ENCOUNTER — Emergency Department (HOSPITAL_COMMUNITY)
Admission: EM | Admit: 2022-07-09 | Discharge: 2022-07-09 | Disposition: A | Discharge status: Skilled Nursing Facility | Payer: Medicare Other | Attending: Emergency Medicine | Admitting: Emergency Medicine

## 2022-07-09 ENCOUNTER — Emergency Department (HOSPITAL_COMMUNITY): Payer: Medicare Other

## 2022-07-09 ENCOUNTER — Other Ambulatory Visit: Payer: Self-pay

## 2022-07-09 DIAGNOSIS — R0789 Other chest pain: Secondary | ICD-10-CM | POA: Diagnosis present

## 2022-07-09 DIAGNOSIS — Z79899 Other long term (current) drug therapy: Secondary | ICD-10-CM | POA: Diagnosis not present

## 2022-07-09 DIAGNOSIS — D72829 Elevated white blood cell count, unspecified: Secondary | ICD-10-CM | POA: Insufficient documentation

## 2022-07-09 DIAGNOSIS — I2510 Atherosclerotic heart disease of native coronary artery without angina pectoris: Secondary | ICD-10-CM | POA: Diagnosis not present

## 2022-07-09 DIAGNOSIS — Z7984 Long term (current) use of oral hypoglycemic drugs: Secondary | ICD-10-CM | POA: Insufficient documentation

## 2022-07-09 LAB — CBC WITH DIFFERENTIAL/PLATELET
Abs Immature Granulocytes: 0.05 10*3/uL (ref 0.00–0.07)
Basophils Absolute: 0.1 10*3/uL (ref 0.0–0.1)
Basophils Relative: 1 %
Eosinophils Absolute: 0.8 10*3/uL — ABNORMAL HIGH (ref 0.0–0.5)
Eosinophils Relative: 7 %
HCT: 38.2 % (ref 36.0–46.0)
Hemoglobin: 12 g/dL (ref 12.0–15.0)
Immature Granulocytes: 0 %
Lymphocytes Relative: 29 %
Lymphs Abs: 3.5 10*3/uL (ref 0.7–4.0)
MCH: 29.6 pg (ref 26.0–34.0)
MCHC: 31.4 g/dL (ref 30.0–36.0)
MCV: 94.1 fL (ref 80.0–100.0)
Monocytes Absolute: 0.7 10*3/uL (ref 0.1–1.0)
Monocytes Relative: 6 %
Neutro Abs: 7 10*3/uL (ref 1.7–7.7)
Neutrophils Relative %: 57 %
Platelets: 297 10*3/uL (ref 150–400)
RBC: 4.06 MIL/uL (ref 3.87–5.11)
RDW: 13.2 % (ref 11.5–15.5)
WBC: 12 10*3/uL — ABNORMAL HIGH (ref 4.0–10.5)
nRBC: 0 % (ref 0.0–0.2)

## 2022-07-09 LAB — COMPREHENSIVE METABOLIC PANEL
ALT: 15 U/L (ref 0–44)
AST: 16 U/L (ref 15–41)
Albumin: 3.6 g/dL (ref 3.5–5.0)
Alkaline Phosphatase: 67 U/L (ref 38–126)
Anion gap: 9 (ref 5–15)
BUN: 14 mg/dL (ref 8–23)
CO2: 28 mmol/L (ref 22–32)
Calcium: 9.2 mg/dL (ref 8.9–10.3)
Chloride: 103 mmol/L (ref 98–111)
Creatinine, Ser: 0.87 mg/dL (ref 0.44–1.00)
GFR, Estimated: >60 mL/min (ref >60)
Glucose, Bld: 82 mg/dL (ref 70–99)
Potassium: 4.5 mmol/L (ref 3.5–5.1)
Sodium: 140 mmol/L (ref 135–145)
Total Bilirubin: 0.4 mg/dL (ref 0.3–1.2)
Total Protein: 6.7 g/dL (ref 6.5–8.1)

## 2022-07-09 LAB — TROPONIN I (HIGH SENSITIVITY)
Troponin I (High Sensitivity): 4 ng/L (ref <18)
Troponin I (High Sensitivity): 3 ng/L (ref <18)

## 2022-07-09 MED ORDER — SODIUM CHLORIDE 0.9 % IV BOLUS
500.0000 mL | Freq: Once | INTRAVENOUS | Status: AC
  Administered 2022-07-09: 500 mL via INTRAVENOUS

## 2022-07-09 MED ORDER — METHOCARBAMOL 500 MG PO TABS: 500.0000 mg | ORAL_TABLET | Freq: Three times a day (TID) | ORAL | 0 refills | Status: DC | PRN

## 2022-07-09 MED ORDER — METHOCARBAMOL 500 MG PO TABS
500.0000 mg | ORAL_TABLET | Freq: Once | ORAL | Status: AC
  Administered 2022-07-09: 500 mg via ORAL
  Filled 2022-07-09: qty 1

## 2022-07-09 MED ORDER — PANTOPRAZOLE SODIUM 40 MG IV SOLR
40.0000 mg | Freq: Once | INTRAVENOUS | Status: AC
  Administered 2022-07-09: 40 mg via INTRAVENOUS
  Filled 2022-07-09: qty 10

## 2022-07-09 MED ORDER — LIDOCAINE 5 % EX PTCH
1.0000 | MEDICATED_PATCH | CUTANEOUS | Status: DC
  Administered 2022-07-09: 1 via TRANSDERMAL
  Filled 2022-07-09: qty 1

## 2022-07-09 MED ORDER — LIDOCAINE 4 % EX PTCH: 1.0000 | MEDICATED_PATCH | CUTANEOUS | 0 refills | Status: AC

## 2022-07-09 MED ORDER — NITROGLYCERIN 0.4 MG SL SUBL
0.4000 mg | SUBLINGUAL_TABLET | Freq: Once | SUBLINGUAL | Status: AC
  Administered 2022-07-09: 0.4 mg via SUBLINGUAL
  Filled 2022-07-09: qty 1

## 2022-07-09 MED ORDER — MORPHINE SULFATE (PF) 2 MG/ML IV SOLN
2.0000 mg | Freq: Once | INTRAVENOUS | Status: AC
  Administered 2022-07-09: 2 mg via INTRAVENOUS
  Filled 2022-07-09: qty 1

## 2022-07-09 MED ORDER — LIDOCAINE 4 % EX PTCH: 1.0000 | MEDICATED_PATCH | CUTANEOUS | 0 refills | Status: DC

## 2022-07-09 NOTE — ED Triage Notes (Signed)
Pt reports left side rib/chest pain that is very tender to the touch since yesterday.

## 2022-07-09 NOTE — ED Provider Notes (Signed)
Care of patient assumed from Dr. Roderic Palau.  This patient presents for left-sided chest wall pain since yesterday.  She has a history of CAD.  Initial troponin is normal.  She is awaiting second troponin and reassessment. Physical Exam  BP (!) 152/113   Pulse 71   Temp 98.4 F (36.9 C) (Oral)   Resp 18   Ht 5\' 3"  (1.6 m)   Wt 102.1 kg   SpO2 97%   BMI 39.87 kg/m   Physical Exam Vitals and nursing note reviewed.  Constitutional:      General: She is not in acute distress.    Appearance: She is well-developed. She is not ill-appearing, toxic-appearing or diaphoretic.  HENT:     Head: Normocephalic and atraumatic.  Eyes:     Conjunctiva/sclera: Conjunctivae normal.  Cardiovascular:     Rate and Rhythm: Normal rate. Rhythm irregular.  Pulmonary:     Effort: Pulmonary effort is normal. No respiratory distress.  Chest:     Chest wall: Tenderness present.  Abdominal:     Palpations: Abdomen is soft.     Tenderness: There is no abdominal tenderness.  Musculoskeletal:        General: No swelling.     Cervical back: Normal range of motion and neck supple.  Skin:    General: Skin is warm and dry.     Coloration: Skin is not cyanotic or pale.  Neurological:     Mental Status: She is alert and oriented to person, place, and time.  Psychiatric:        Mood and Affect: Mood normal.        Behavior: Behavior normal.     Procedures  Procedures  ED Course / MDM    Medical Decision Making Amount and/or Complexity of Data Reviewed Labs: ordered. Radiology: ordered.  Risk Prescription drug management.   On assessment, patient resting in recumbent position.  She describes ongoing waves of sharp pains to the left chest wall area, under her breast.  She currently resides in a rehab facility following a stroke last year.  Since her stroke, she has had residual weakness to her left hand.  She has gotten back to walking with a walker.  She denies any recent falls or traumas.  She denies  any recent straining activity.  She states that the onset of pain was yesterday and came out of nowhere.  Patient recently received dose of morphine.  Will provide multimodal pain control.  X-ray did not show any acute findings.  Will obtain CTA of chest.  On cardiac monitor, patient has an irregular heart rhythm.  Initial EKG showed sinus rhythm with PACs.  Repeat EKG shows the same.  Second 1 was normal.  There were no acute findings on CT of chest that explains her recent symptoms.  She did have significant improvement in her pain following multimodal pain control.  She was advised to continue at her nursing facility.  She was discharged in good condition.       Godfrey Pick, MD 07/09/22 Lurline Hare

## 2022-07-09 NOTE — ED Notes (Signed)
Pt had nitro S/L x 3 doses with no relief of pain.

## 2022-07-09 NOTE — ED Provider Notes (Signed)
[facility] Provider Note   CSN: [id number] Arrival date & time: 07/09/22  1334     History  Chief Complaint  Patient presents with   Chest Pain    Valerie Wagner is a 72 y.o. female.  Patient complains of periodic chest discomfort since yesterday.  Patient has a history of coronary artery disease with an MI  The history is provided by the patient. No language interpreter was used.  Chest Pain Pain location:  L chest Pain quality: dull   Pain radiates to:  Does not radiate Pain severity:  Moderate Onset quality:  Sudden Timing:  Constant Progression:  Worsening Chronicity:  New Relieved by:  Nothing Worsened by:  Nothing Associated symptoms: no abdominal pain, no back pain, no cough, no fatigue and no headache        Home Medications Prior to Admission medications   Medication Sig Start Date End Date Taking? Authorizing Provider  ACCU-CHEK AVIVA PLUS test strip  02/05/20   [provider]  acetaminophen (TYLENOL) 325 MG tablet Take 650 mg by mouth every 6 (six) hours as needed for moderate pain.    [provider]  albuterol (VENTOLIN HFA) 108 (90 Base) MCG/ACT inhaler Inhale 2 puffs into the lungs every 6 (six) hours as needed for wheezing or shortness of breath.     [provider]  atorvastatin (LIPITOR) 80 MG tablet Take 80 mg by mouth every evening. 06/17/20   [provider]  Calcium Carbonate Antacid (CALCIUM CARBONATE PO) Take 1 tablet by mouth daily as needed (indigestion).    [provider]  Cranberry 450 MG CAPS Take 450 mg by mouth daily at 6 (six) AM.    [provider]  DULoxetine (CYMBALTA) 60 MG capsule Take 60 mg by mouth 2 (two) times daily. 06/17/20   [provider]  fluticasone furoate-vilanterol (BREO ELLIPTA) 200-25 MCG/ACT AEPB Inhale 1 puff into the lungs daily.    [provider]  furosemide (LASIX) 20 MG tablet Take 20 mg by mouth  every Monday, Wednesday, and Friday.    [provider]  HYDROcodone-acetaminophen (NORCO) 5-325 MG tablet Take 1-2 tablets by mouth every 6 (six) hours as needed. 12/21/21   Veryl Speak, MD  hydrOXYzine (VISTARIL) 25 MG capsule Take 25 mg by mouth at bedtime.    [provider]  LANTUS SOLOSTAR 100 UNIT/ML Solostar Pen 50 Units at bedtime. 02/05/20   [provider]  Lidocaine 4 % PTCH Apply 1 patch topically daily.    [provider]  lisinopril (ZESTRIL) 10 MG tablet Take 10 mg by mouth daily.    [provider]  loperamide (IMODIUM) 2 MG capsule Take 2-4 mg by mouth 4 (four) times daily as needed for diarrhea or loose stools.    [provider]  magnesium oxide (MAG-OX) 400 MG tablet Take 400 mg by mouth daily.    [provider]  Menthol, Topical Analgesic, (BIOFREEZE) 4 % GEL Apply 1 application. topically every 8 (eight) hours as needed (pain).    [provider]  metFORMIN (GLUCOPHAGE) 1000 MG tablet Take 1,000 mg by mouth 2 (two) times daily with a meal.    [provider]  methocarbamol (ROBAXIN) 500 MG tablet Take 1 tablet (500 mg total) by mouth 2 (two) times daily. 04/13/22   Prosperi, Christian H, PA-C  oxybutynin (DITROPAN) 5 MG tablet Take 5 mg by mouth 2 (two) times daily.    [provider]  oxyCODONE-acetaminophen (PERCOCET/ROXICET) 5-325 MG tablet Take 1 tablet by mouth every 6 (six) hours as needed for severe pain. 04/13/22   Prosperi, Christian H, PA-C  pantoprazole (PROTONIX) 40 MG tablet Take 40 mg by mouth daily.    [provider]  Polyethyl Glycol-Propyl Glycol (SYSTANE) 0.4-0.3 % SOLN Place 2 drops into both eyes 2 (two) times daily.    [provider]  polyethylene glycol-electrolytes (TRILYTE) 420 g solution Take 4,000 mLs by mouth as directed. 08/24/21   Dolores Frame, MD  predniSONE (DELTASONE) 20 MG tablet Take 2 tablets (40 mg total) by mouth daily.  04/13/22   Prosperi, Christian H, PA-C  pregabalin (LYRICA) 75 MG capsule Take 75 mg by mouth at bedtime.    [provider]  rOPINIRole (REQUIP) 1 MG tablet Take 1 mg by mouth at bedtime.    [provider]  traMADol (ULTRAM) 50 MG tablet Take 50 mg by mouth every 6 (six) hours as needed for moderate pain. 04/27/20   [provider]  traZODone (DESYREL) 150 MG tablet Take 150 mg by mouth at bedtime.    [provider]      Allergies    Nsaids, Bee venom, Cephalosporins, Chocolate, Darvon [propoxyphene], Ivp dye [iodinated contrast media], Doxycycline, and Penicillins    Review of Systems   Review of Systems  Constitutional:  Negative for appetite change and fatigue.  HENT:  Negative for congestion, ear discharge and sinus pressure.   Eyes:  Negative for discharge.  Respiratory:  Negative for cough.   Cardiovascular:  Positive for chest pain.  Gastrointestinal:  Negative for abdominal pain and diarrhea.  Genitourinary:  Negative for frequency and hematuria.  Musculoskeletal:  Negative for back pain.  Skin:  Negative for rash.  Neurological:  Negative for seizures and headaches.  Psychiatric/Behavioral:  Negative for hallucinations.     Physical Exam Updated Vital Signs BP (!) 152/113   Pulse 71   Temp 98.4 F (36.9 C) (Oral)   Resp 18   Ht 5\' 3"  (1.6 m)   Wt 102.1 kg   SpO2 97%   BMI 39.87 kg/m  Physical Exam Vitals and nursing note reviewed.  Constitutional:      Appearance: She is well-developed.  HENT:     Head: Normocephalic.     Nose: Nose normal.  Eyes:     General: No scleral icterus.    Conjunctiva/sclera: Conjunctivae normal.  Neck:     Thyroid: No thyromegaly.  Cardiovascular:     Rate and Rhythm: Normal rate and regular rhythm.     Heart sounds: No murmur heard.    No friction rub. No gallop.  Pulmonary:     Breath sounds: No stridor. No wheezing or rales.  Chest:     Chest wall: No tenderness.  Abdominal:      General: There is no distension.     Tenderness: There is no abdominal tenderness. There is no rebound.  Musculoskeletal:        General: Normal range of motion.     Cervical back: Neck supple.  Lymphadenopathy:     Cervical: No cervical adenopathy.  Skin:    Findings: No erythema or rash.  Neurological:     Mental Status: She is alert and oriented to person, place, and time.     Motor: No abnormal muscle tone.     Coordination: Coordination normal.  Psychiatric:        Behavior: Behavior normal.     ED Results / Procedures /  Treatments   Labs (all labs ordered are listed, but only abnormal results are displayed) Labs Reviewed  CBC WITH DIFFERENTIAL/PLATELET - Abnormal; Notable for the following components:      Result Value   WBC 12.0 (*)    Eosinophils Absolute 0.8 (*)    All other components within normal limits  COMPREHENSIVE METABOLIC PANEL  TROPONIN I (HIGH SENSITIVITY)  TROPONIN I (HIGH SENSITIVITY)    EKG EKG Interpretation  Date/Time:  Saturday July 09 2022 13:49:57 EST Ventricular Rate:  91 PR Interval:  170 QRS Duration: 101 QT Interval:  392 QTC Calculation: 411 R Axis:   55 Text Interpretation: Sinus rhythm Atrial premature complexes Low voltage, precordial leads Confirmed by Bethann Berkshire [phone]) on 07/09/2022 3:50:02 PM  Radiology DG Chest Port 1 View  Result Date: 07/09/2022 CLINICAL DATA:  Left rib and chest pain. EXAM: PORTABLE CHEST 1 VIEW COMPARISON:  Chest radiograph 12/30/2020 FINDINGS: Stable cardiomegaly. No large area pulmonary consolidation. No pleural effusion or pneumothorax. Osseous structures unremarkable. IMPRESSION: Cardiomegaly. No acute process. Consider dedicated rib series if there is concern for a rib fracture. Electronically Signed   By: Annia Belt M.D.   On: 07/09/2022 15:16    Procedures Procedures    Medications Ordered in ED Medications  morphine (PF) 2 MG/ML injection 2 mg (has no administration in time range)   pantoprazole (PROTONIX) injection 40 mg (has no administration in time range)  nitroGLYCERIN (NITROSTAT) SL tablet 0.4 mg (0.4 mg Sublingual Given 07/09/22 1448)  sodium chloride 0.9 % bolus 500 mL (0 mLs Intravenous Stopped 07/09/22 1548)    ED Course/ Medical Decision Making/ A&P                             Medical Decision Making Amount and/or Complexity of Data Reviewed Labs: ordered. Radiology: ordered.  Risk OTC drugs. Prescription drug management.  This patient presents to the ED for concern of chest pain, this involves an extensive number of treatment options, and is a complaint that carries with it a high risk of complications and morbidity.  The differential diagnosis includes MI, PE   Co morbidities that complicate the patient evaluation  History of MI   Additional history obtained:  Additional history obtained from patient External records from outside source obtained and reviewed including hospital and nursing home records   Lab Tests:  I Ordered, and personally interpreted labs.  The pertinent results include: White count mildly elevated, troponin is normal   Imaging Studies ordered:  I ordered imaging studies including CT chest I independently visualized and interpreted imaging which showed no acute disease I agree with the radiologist interpretation   Cardiac Monitoring: / EKG:  The patient was maintained on a cardiac monitor.  I personally viewed and interpreted the cardiac monitored which showed an underlying rhythm of: Normal sinus rhythm   Consultations Obtained:  No consultant  Problem List / ED Course / Critical interventions / Medication management   Chest pain I ordered medication including morphine and nitroglycerin Reevaluation of the patient after these medicines showed that the patient improved I have reviewed the patients home medicines and have made adjustments as needed   Social Determinants of Health:  Stays in a nursing  home   Test / Admission - Considered:  None  Patient with chest pain.  Patient's second troponin is pending   Patient had a normal second troponin was discharged back to nursing home by Dr. Durwin Nora  Final Clinical Impression(s) / ED Diagnoses Final diagnoses:  None    Rx / DC Orders ED Discharge Orders     None         Milton Ferguson, MD 07/12/22 [phone]

## 2022-07-09 NOTE — Discharge Instructions (Addendum)
Take ibuprofen and Tylenol as needed for pain.  Additionally, there are prescriptions attached for lidocaine patches and a muscle relaxer.  Use these as needed.  Your symptoms should improve over the next several days.  Follow-up with your primary care doctor when possible.  Return to the emergency department for any new or worsening symptoms of concern.

## 2022-09-09 ENCOUNTER — Ambulatory Visit: Payer: Medicare Other | Attending: Internal Medicine | Admitting: Internal Medicine

## 2022-09-09 ENCOUNTER — Encounter: Payer: Self-pay | Admitting: *Deleted

## 2022-09-09 ENCOUNTER — Encounter: Payer: Self-pay | Admitting: Internal Medicine

## 2022-09-09 VITALS — BP 140/76 | HR 60 | Ht 63.0 in

## 2022-09-09 DIAGNOSIS — I5030 Unspecified diastolic (congestive) heart failure: Secondary | ICD-10-CM | POA: Insufficient documentation

## 2022-09-09 DIAGNOSIS — R079 Chest pain, unspecified: Secondary | ICD-10-CM | POA: Diagnosis present

## 2022-09-09 DIAGNOSIS — G4733 Obstructive sleep apnea (adult) (pediatric): Secondary | ICD-10-CM | POA: Diagnosis present

## 2022-09-09 DIAGNOSIS — Z8673 Personal history of transient ischemic attack (TIA), and cerebral infarction without residual deficits: Secondary | ICD-10-CM | POA: Diagnosis present

## 2022-09-09 NOTE — Patient Instructions (Signed)
Medication Instructions:  Your physician recommends that you continue on your current medications as directed. Please refer to the Current Medication list given to you today.  *If you need a refill on your cardiac medications before your next appointment, please call your pharmacy*   Lab Work: NONE   If you have labs (blood work) drawn today and your tests are completely normal, you will receive your results only by: [place] (if you have MyChart) OR A paper copy in the mail If you have any lab test that is abnormal or we need to change your treatment, we will call you to review the results.   Testing/Procedures: Your physician has requested that you have an echocardiogram. Echocardiography is a painless test that uses sound waves to create images of your heart. It provides your doctor with information about the size and shape of your heart and how well your heart's chambers and valves are working. This procedure takes approximately one hour. There are no restrictions for this procedure. Please do NOT wear cologne, perfume, aftershave, or lotions (deodorant is allowed). Please arrive 15 minutes prior to your appointment time.  Your physician has requested that you have a lexiscan myoview. For further information please visit [email or web address]. Please follow instruction sheet, as given.    Follow-Up: At [facility], you and your health needs are our priority.  As part of our continuing mission to provide you with exceptional heart care, we have created designated Provider Care Teams.  These Care Teams include your primary Cardiologist (physician) and Advanced Practice Providers (APPs -  Physician Assistants and Nurse Practitioners) who all work together to provide you with the care you need, when you need it.  We recommend signing up for the patient portal called "MyChart".  Sign up information is provided on this After Visit Summary.  MyChart is used to connect with  patients for Virtual Visits (Telemedicine).  Patients are able to view lab/test results, encounter notes, upcoming appointments, etc.  Non-urgent messages can be sent to your provider as well.   To learn more about what you can do with MyChart, go to [email or web address].    Your next appointment:   1 year(s)  Provider:   Claudina Lick, MD    Other Instructions Thank you for choosing [facility]!

## 2022-09-09 NOTE — Progress Notes (Signed)
Cardiology Office Note  Date: 09/09/2022   ID: Valerie Wagner, DOB 1951/01/01, MRN [id number]  PCP:  Caryl Bis, MD  Cardiologist:  None Electrophysiologist:  None   Reason for Office Visit: Evaluation chest pain at the request of Dr. Quillian Quince   History of Present Illness: Valerie Wagner is a 72 y.o. female known to have HTN, DM 2, COPD, history of CVA was referred to cardiology clinic for evaluation chest pain. Accompanied by caretaker.  Patient has sedentary lifestyle. Walks with a walker. She is an Training and development officer, [employer]. The most minutes after she has done was walking with physical therapy.  She has roommates at the facility, whenever they make noise especially at night, she starts to get pain in the left arm radiating to her chest and sometimes she did not pay attention if the chest pain started first or the pain in the left arm. She also has degenerative changes in her cervical spine. Denies other symptoms of dizziness, syncope, palpitations or DOE.  Denies smoking symptoms or alcohol use.  She was diagnosed with OSA in the past but sounds to her sleep admission. Currently not on CPAP at the facility.  Past Medical History:  Diagnosis Date   Anxiety    Asthma    Chest pain    Reassuring workup of her time including negative cardiac catheterization and stress testing   COPD (chronic obstructive pulmonary disease) (HCC)    DDD (degenerative disc disease), lumbar    Depression    DJD (degenerative joint disease)    Essential hypertension, benign    Fibromyalgia    GERD (gastroesophageal reflux disease)    IBS (irritable bowel syndrome)    Mixed hyperlipidemia    Neuropathy    Obesity    Seizure ([place])    Was on meds for 6 months and had no more seizures. No meds now.   Shortness of breath dyspnea    Stroke ([place])    Type 2 diabetes mellitus ([place])    Urinary incontinence     Past Surgical History:  Procedure Laterality Date   CARPAL TUNNEL RELEASE Bilateral    Right    CESAREAN SECTION     CHOLECYSTECTOMY     ESOPHAGEAL DILATION N/A 09/21/2021   Procedure: ESOPHAGEAL DILATION;  Surgeon: Harvel Quale, MD;  [facility];  Service: Gastroenterology;  Laterality: N/A;   ESOPHAGOGASTRODUODENOSCOPY (EGD) WITH PROPOFOL N/A 09/21/2021   Procedure: ESOPHAGOGASTRODUODENOSCOPY (EGD) WITH PROPOFOL;  Surgeon: Harvel Quale, MD;  Location: [facility];  Service: Gastroenterology;  Laterality: N/A;   FLEXIBLE SIGMOIDOSCOPY N/A 09/21/2021   Procedure: FLEXIBLE SIGMOIDOSCOPY;  Surgeon: Harvel Quale, MD;  Location: [facility];  Service: Gastroenterology;  Laterality: N/A;  [address] Patient is in [facility]   Hysterectomy -- unknown     Left Thumb Surgery Bilateral    RADIOLOGY WITH ANESTHESIA N/A 06/30/2015   Procedure: RADIOLOGY WITH ANESTHESIA;  Surgeon: Sandi Mariscal, MD;  Location: [facility];  Service: Radiology;  Laterality: N/A;   TONSILLECTOMY      Current Outpatient Medications  Medication Sig Dispense Refill   ACCU-CHEK AVIVA PLUS test strip      aspirin 81 MG chewable tablet Chew 81 mg by mouth daily.     atorvastatin (LIPITOR) 80 MG tablet Take 80 mg by mouth daily.     busPIRone (BUSPAR) 5 MG tablet Take 5 mg by mouth 3 (three) times daily.     Cranberry 450 MG CAPS Take 450 mg by  mouth daily.     Dulaglutide (TRULICITY) A999333 0000000 SOPN Inject 0.75 mg into the skin once a week. Sundays.     DULoxetine (CYMBALTA) 60 MG capsule Take 60 mg by mouth 2 (two) times daily.     fluticasone furoate-vilanterol (BREO ELLIPTA) 200-25 MCG/ACT AEPB Inhale 1 puff into the lungs daily.     furosemide (LASIX) 20 MG tablet Take 20 mg by mouth every Monday, Wednesday, and Friday.     hydrOXYzine (ATARAX) 25 MG tablet Take 25 mg by mouth at bedtime.     Insulin Glargine (BASAGLAR KWIKPEN) 100 UNIT/ML Inject 50 Units into the skin at bedtime.     lisinopril (ZESTRIL) 10 MG tablet Take 10 mg by mouth daily.     magnesium oxide  (MAG-OX) 400 MG tablet Take 400 mg by mouth daily.     metFORMIN (GLUCOPHAGE) 1000 MG tablet Take 1,000 mg by mouth 2 (two) times daily with a meal.     mirabegron ER (MYRBETRIQ) 25 MG TB24 tablet Take 25 mg by mouth daily.     nystatin (MYCOSTATIN/NYSTOP) powder Apply 1 Application topically See admin instructions. Apply topically to the top of right breast twice daily.   Also has a PRN order: Apply to the affected areas every eight hours as needed for redness/rash.     pantoprazole (PROTONIX) 40 MG tablet Take 40 mg by mouth daily.     Polyethyl Glycol-Propyl Glycol (SYSTANE) 0.4-0.3 % SOLN Place 2 drops into both eyes 2 (two) times daily.     pregabalin (LYRICA) 75 MG capsule Take 75 mg by mouth 2 (two) times daily.     rOPINIRole (REQUIP) 1 MG tablet Take 1 mg by mouth at bedtime.     traMADol (ULTRAM) 50 MG tablet Take 50 mg by mouth in the morning, at noon, and at bedtime.     traZODone (DESYREL) 150 MG tablet Take 150 mg by mouth at bedtime.     acetaminophen (TYLENOL) 325 MG tablet Take 650 mg by mouth every 6 (six) hours as needed for moderate pain. (Patient not taking: Reported on 09/09/2022)     albuterol (VENTOLIN HFA) 108 (90 Base) MCG/ACT inhaler Inhale 2 puffs into the lungs every 6 (six) hours as needed for wheezing or shortness of breath.  (Patient not taking: Reported on 09/09/2022)     benzonatate (TESSALON) 100 MG capsule Take 100 mg by mouth 3 (three) times daily as needed for cough. (Patient not taking: Reported on 09/09/2022)     loperamide (IMODIUM) 2 MG capsule Take 2-4 mg by mouth 4 (four) times daily as needed for diarrhea or loose stools. (Patient not taking: Reported on 09/09/2022)     methocarbamol (ROBAXIN) 500 MG tablet Take 1 tablet (500 mg total) by mouth every 8 (eight) hours as needed for muscle spasms. (Patient not taking: Reported on 09/09/2022) 20 tablet 0   oxyCODONE-acetaminophen (PERCOCET/ROXICET) 5-325 MG tablet Take 1 tablet by mouth every 6 (six) hours as  needed for severe pain. (Patient not taking: Reported on 09/09/2022) 8 tablet 0   No current facility-administered medications for this visit.   Allergies:  Nsaids, Bee venom, Cephalosporins, Chocolate, Darvon [propoxyphene], Ivp dye [iodinated contrast media], Doxycycline, and Penicillins   Social History: The patient  reports that she quit smoking about 40 years ago. Her smoking use included cigarettes. She has a 2.00 pack-year smoking history. She has never used smokeless tobacco. She reports that she does not drink alcohol and does not use drugs.   Family  History: The patient's family history includes Coronary artery disease in her father; Heart attack in her father.   ROS:  Please see the history of present illness. Otherwise, complete review of systems is positive for none.  All other systems are reviewed and negative.   Physical Exam: VS:  BP (!) 140/76   Pulse 60   Ht 5\' 3"  (1.6 m)   SpO2 93%   BMI 39.87 kg/m , BMI Body mass index is 39.87 kg/m.  Wt Readings from Last 3 Encounters:  07/09/22 225 lb 1.4 oz (102.1 kg)  04/13/22 225 lb (102.1 kg)  03/16/22 275 lb 1.4 oz (124.8 kg)    General: Patient appears comfortable at rest. HEENT: Conjunctiva and lids normal, oropharynx clear with moist mucosa. Neck: Supple, no elevated JVP or carotid bruits, no thyromegaly. Lungs: Clear to auscultation, nonlabored breathing at rest. Cardiac: Regular rate and rhythm, no S3 or significant systolic murmur, no pericardial rub. Abdomen: Soft, nontender, no hepatomegaly, bowel sounds present, no guarding or rebound. Extremities: 1+ pitting edema in bilateral lower extremities Skin: Warm and dry. Musculoskeletal: No kyphosis. Neuropsychiatric: Alert and oriented x3, affect grossly appropriate.  ECG: NSR  Recent Labwork: 07/09/2022: ALT 15; AST 16; BUN 14; Creatinine, Ser 0.87; Hemoglobin 12.0; Platelets 297; Potassium 4.5; Sodium 140  No results found for: "CHOL", "TRIG", "HDL", "CHOLHDL",  "VLDL", "LDLCALC", "LDLDIRECT"  Other Studies Reviewed Today:   Assessment and Plan: Patient is a 72 year old F known to have HTN, DM 2, COPD, history of CVA was referred to cardiology clinic for evaluation of chest pain.  # Atypical chest pain -Patient unable to recall if the pain started in her chest and radiated to left arm or vice versa. This happens especially when her roommates make noise or when she is upset.  She will benefit from [facility] due to atypical chest pain and risk factors of HTN, DM 2, history of CVA.  # History CVA with residual paralysis -Continue aspirin 81 mg once daily and atorvastatin 80 mg nightly  # HLD Plan continue atorvastatin 80 mg nightly.  Goal LDL less than 70.  # HTN, controlled -Continue lisinopril 10 mg once daily  # Possibly HFpEF -Continue Lasix 20 mg MWF -Obtain 2D echocardiogram  # OSA not on CPAP -Follow-up with PCP for CPAP initiation  I have spent a total of 45 minutes with patient reviewing chart, EKGs, labs and examining patient as well as establishing an assessment and plan that was discussed with the patient.  > 50% of time was spent in direct patient care.     Medication Adjustments/Labs and Tests Ordered: Current medicines are reviewed at length with the patient today.  Concerns regarding medicines are outlined above.   Tests Ordered: Orders Placed This Encounter  Procedures   NM Myocar Multi W/Spect W/Wall Motion / EF   ECHOCARDIOGRAM COMPLETE     Medication Changes: No orders of the defined types were placed in this encounter.   Disposition:  Follow up one year  Signed, Keelin Sheridan Fidel Levy, MD, 09/09/2022 9:01 AM    [facility] [address], [place], [place] [address]

## 2022-09-19 ENCOUNTER — Encounter (HOSPITAL_COMMUNITY): Payer: Medicare Other

## 2022-09-19 ENCOUNTER — Inpatient Hospital Stay (HOSPITAL_COMMUNITY): Admission: RE | Admit: 2022-09-19 | Payer: Medicare Other | Source: Ambulatory Visit

## 2022-09-21 ENCOUNTER — Encounter (HOSPITAL_COMMUNITY): Payer: Medicare Other

## 2022-09-26 ENCOUNTER — Encounter (HOSPITAL_COMMUNITY): Payer: Medicare Other

## 2022-09-28 ENCOUNTER — Other Ambulatory Visit (HOSPITAL_COMMUNITY): Payer: Medicare Other

## 2022-09-28 ENCOUNTER — Encounter (HOSPITAL_COMMUNITY): Payer: Medicare Other

## 2022-10-07 ENCOUNTER — Other Ambulatory Visit (HOSPITAL_COMMUNITY): Payer: Medicare Other

## 2022-10-10 ENCOUNTER — Ambulatory Visit (HOSPITAL_BASED_OUTPATIENT_CLINIC_OR_DEPARTMENT_OTHER)
Admission: RE | Admit: 2022-10-10 | Discharge: 2022-10-10 | Disposition: A | Discharge status: Home / Self Care | Payer: Medicare Other | Source: Ambulatory Visit | Attending: Internal Medicine | Admitting: Internal Medicine

## 2022-10-10 ENCOUNTER — Ambulatory Visit (HOSPITAL_COMMUNITY)
Admission: RE | Admit: 2022-10-10 | Discharge: 2022-10-10 | Disposition: A | Discharge status: Home / Self Care | Payer: Medicare Other | Source: Ambulatory Visit | Attending: Internal Medicine | Admitting: Internal Medicine

## 2022-10-10 DIAGNOSIS — R079 Chest pain, unspecified: Secondary | ICD-10-CM | POA: Insufficient documentation

## 2022-10-10 DIAGNOSIS — G4733 Obstructive sleep apnea (adult) (pediatric): Secondary | ICD-10-CM | POA: Diagnosis not present

## 2022-10-10 LAB — NM MYOCAR MULTI W/SPECT W/WALL MOTION / EF
Base ST Depression (mm): 0 mm
LV dias vol: 86 mL (ref 46–106)
LV sys vol: 45 mL
Nuc Stress EF: 47 %
Peak HR: 85 {beats}/min
RATE: 0.3
Rest HR: 71 {beats}/min
Rest Nuclear Isotope Dose: 9.9 mCi
SDS: 6
SRS: 2
SSS: 8
ST Depression (mm): 0 mm
Stress Nuclear Isotope Dose: 29.3 mCi
TID: 0.99

## 2022-10-10 MED ORDER — REGADENOSON 0.4 MG/5ML IV SOLN
INTRAVENOUS | Status: AC
  Administered 2022-10-10: 0.4 mg
  Filled 2022-10-10: qty 5

## 2022-10-10 MED ORDER — TECHNETIUM TC 99M TETROFOSMIN IV KIT
10.0000 | PACK | Freq: Once | INTRAVENOUS | Status: AC | PRN
  Administered 2022-10-10: 9.89 via INTRAVENOUS

## 2022-10-10 MED ORDER — TECHNETIUM TC 99M TETROFOSMIN IV KIT
30.0000 | PACK | Freq: Once | INTRAVENOUS | Status: AC | PRN
  Administered 2022-10-10: 29.3 via INTRAVENOUS

## 2022-10-10 MED ORDER — SODIUM CHLORIDE FLUSH 0.9 % IV SOLN
INTRAVENOUS | Status: AC
  Administered 2022-10-10: 10 mL via INTRAVENOUS
  Filled 2022-10-10: qty 10

## 2022-10-13 ENCOUNTER — Ambulatory Visit (HOSPITAL_COMMUNITY)
Admission: RE | Admit: 2022-10-13 | Discharge: 2022-10-13 | Disposition: A | Discharge status: Home / Self Care | Payer: Medicare Other | Source: Ambulatory Visit | Attending: Internal Medicine | Admitting: Internal Medicine

## 2022-10-13 ENCOUNTER — Telehealth: Payer: Self-pay

## 2022-10-13 DIAGNOSIS — I2510 Atherosclerotic heart disease of native coronary artery without angina pectoris: Secondary | ICD-10-CM

## 2022-10-13 DIAGNOSIS — R079 Chest pain, unspecified: Secondary | ICD-10-CM | POA: Diagnosis present

## 2022-10-13 LAB — ECHOCARDIOGRAM COMPLETE
Area-P 1/2: 4.4 cm2
S' Lateral: 2.6 cm

## 2022-10-13 MED ORDER — METOPROLOL TARTRATE 50 MG PO TABS: ORAL_TABLET | ORAL | 0 refills | Status: DC

## 2022-10-13 MED ORDER — PREDNISONE 50 MG PO TABS: ORAL_TABLET | ORAL | 0 refills | Status: DC

## 2022-10-13 MED ORDER — DIPHENHYDRAMINE HCL 50 MG PO TABS: ORAL_TABLET | ORAL | 0 refills | Status: DC

## 2022-10-13 NOTE — Progress Notes (Signed)
*  PRELIMINARY RESULTS* Echocardiogram 2D Echocardiogram has been performed.  Stacey Drain 10/13/2022, 1:25 PM

## 2022-10-13 NOTE — Telephone Encounter (Signed)
-----   Message from Marjo Bicker, MD sent at 10/13/2022 12:37 PM EDT ----- Stress test is abnormal. Test shows there could be a blockage in one of the heart vessels but this could be an artifact as well. Patient's symptoms of chest pain were not typical for CAD. Will go ahead and obtain CTA cardiac to definitely rule out CAD. She has contrast allergy. Prescribe steroid regimen prior to the CTA scan, per protocol.

## 2022-10-20 ENCOUNTER — Other Ambulatory Visit (HOSPITAL_COMMUNITY): Payer: Medicare Other

## 2022-10-21 ENCOUNTER — Inpatient Hospital Stay (HOSPITAL_COMMUNITY): Admission: RE | Admit: 2022-10-21 | Payer: Medicare Other | Source: Ambulatory Visit

## 2022-10-26 ENCOUNTER — Other Ambulatory Visit (HOSPITAL_COMMUNITY)
Admission: RE | Admit: 2022-10-26 | Discharge: 2022-10-26 | Disposition: A | Discharge status: Home / Self Care | Payer: Medicare Other | Source: Ambulatory Visit | Attending: Internal Medicine | Admitting: Internal Medicine

## 2022-10-26 ENCOUNTER — Other Ambulatory Visit (HOSPITAL_COMMUNITY): Payer: Self-pay | Admitting: Emergency Medicine

## 2022-10-26 ENCOUNTER — Telehealth (HOSPITAL_COMMUNITY): Payer: Self-pay | Admitting: Emergency Medicine

## 2022-10-26 ENCOUNTER — Encounter (HOSPITAL_COMMUNITY): Payer: Self-pay | Admitting: Emergency Medicine

## 2022-10-26 DIAGNOSIS — R079 Chest pain, unspecified: Secondary | ICD-10-CM | POA: Insufficient documentation

## 2022-10-26 LAB — BASIC METABOLIC PANEL
Anion gap: 10 (ref 5–15)
BUN: 20 mg/dL (ref 8–23)
CO2: 26 mmol/L (ref 22–32)
Calcium: 9.7 mg/dL (ref 8.9–10.3)
Chloride: 101 mmol/L (ref 98–111)
Creatinine, Ser: 0.89 mg/dL (ref 0.44–1.00)
GFR, Estimated: >60 mL/min (ref >60)
Glucose, Bld: 138 mg/dL — ABNORMAL HIGH (ref 70–99)
Potassium: 4.7 mmol/L (ref 3.5–5.1)
Sodium: 137 mmol/L (ref 135–145)

## 2022-10-26 MED ORDER — DIPHENHYDRAMINE HCL 50 MG PO TABS
ORAL_TABLET | ORAL | 0 refills | Status: DC
Start: 2022-10-26 — End: 2023-11-05

## 2022-10-26 MED ORDER — PREDNISONE 50 MG PO TABS
ORAL_TABLET | ORAL | 0 refills | Status: DC
Start: 2022-10-26 — End: 2023-11-05

## 2022-10-26 MED ORDER — METOPROLOL TARTRATE 50 MG PO TABS
ORAL_TABLET | ORAL | 0 refills | Status: DC
Start: 2022-10-26 — End: 2022-11-01

## 2022-10-26 NOTE — Telephone Encounter (Signed)
Reaching out to patient to offer assistance regarding upcoming cardiac imaging study; pt verbalizes understanding of appt date/time, parking situation and where to check in, pre-test NPO status and medications ordered, and verified current allergies; name and call back number provided for further questions should they arise Rockwell Alexandria RN Navigator Cardiac Imaging [facility] [phone] office [phone] cell  Spoke to kelly at facility Pt to get labs today Meds sent to preferred pharm Faxed instructions to faciltiy

## 2022-10-28 ENCOUNTER — Ambulatory Visit (HOSPITAL_COMMUNITY)
Admission: RE | Admit: 2022-10-28 | Discharge: 2022-10-28 | Disposition: A | Discharge status: Home / Self Care | Payer: Medicare Other | Source: Ambulatory Visit | Attending: Internal Medicine | Admitting: Internal Medicine

## 2022-10-28 DIAGNOSIS — I2510 Atherosclerotic heart disease of native coronary artery without angina pectoris: Secondary | ICD-10-CM

## 2022-10-28 MED ORDER — IOHEXOL 350 MG/ML SOLN
100.0000 mL | Freq: Once | INTRAVENOUS | Status: AC | PRN
  Administered 2022-10-28: 100 mL via INTRAVENOUS

## 2022-10-28 MED ORDER — NITROGLYCERIN 0.4 MG SL SUBL
SUBLINGUAL_TABLET | SUBLINGUAL | Status: AC
  Filled 2022-10-28: qty 2

## 2022-10-28 MED ORDER — NITROGLYCERIN 0.4 MG SL SUBL
0.8000 mg | SUBLINGUAL_TABLET | Freq: Once | SUBLINGUAL | Status: AC
  Administered 2022-10-28: 0.8 mg via SUBLINGUAL

## 2022-11-01 ENCOUNTER — Telehealth: Payer: Self-pay | Admitting: *Deleted

## 2022-11-01 MED ORDER — METOPROLOL TARTRATE 25 MG PO TABS: 25.0000 mg | ORAL_TABLET | Freq: Two times a day (BID) | ORAL | 3 refills | Status: DC

## 2022-11-01 NOTE — Telephone Encounter (Signed)
-----   Message from Marjo Bicker, MD sent at 11/01/2022 10:51 AM EDT ----- Non-diagnostic CTA cardiac but the proximal coronary artery vessels are patent. Distal vessels are not well-visualized due to breathing artifact. Will treat medically for now, start metoprolol tartrate 25 mg twice daily.  Schedule follow-up in 3 months.

## 2022-11-01 NOTE — Telephone Encounter (Signed)
Results given to [facility] planner).

## 2023-02-01 ENCOUNTER — Emergency Department (HOSPITAL_COMMUNITY): Payer: Medicare Other

## 2023-02-01 ENCOUNTER — Encounter (HOSPITAL_COMMUNITY): Payer: Self-pay

## 2023-02-01 ENCOUNTER — Ambulatory Visit (INDEPENDENT_AMBULATORY_CARE_PROVIDER_SITE_OTHER): Payer: Medicare Other | Admitting: Internal Medicine

## 2023-02-01 ENCOUNTER — Emergency Department (HOSPITAL_COMMUNITY)
Admission: EM | Admit: 2023-02-01 | Discharge: 2023-02-02 | Disposition: A | Discharge status: Home / Self Care | Payer: Medicare Other | Attending: Emergency Medicine | Admitting: Emergency Medicine

## 2023-02-01 ENCOUNTER — Other Ambulatory Visit (HOSPITAL_COMMUNITY)
Admission: RE | Admit: 2023-02-01 | Discharge: 2023-02-01 | Disposition: A | Discharge status: Home / Self Care | Payer: Medicare Other | Source: Ambulatory Visit | Attending: Internal Medicine | Admitting: Internal Medicine

## 2023-02-01 ENCOUNTER — Encounter: Payer: Self-pay | Admitting: Internal Medicine

## 2023-02-01 ENCOUNTER — Other Ambulatory Visit: Payer: Self-pay

## 2023-02-01 VITALS — BP 138/72 | HR 52 | Ht 63.0 in

## 2023-02-01 DIAGNOSIS — I2510 Atherosclerotic heart disease of native coronary artery without angina pectoris: Secondary | ICD-10-CM

## 2023-02-01 DIAGNOSIS — M25572 Pain in left ankle and joints of left foot: Secondary | ICD-10-CM | POA: Diagnosis not present

## 2023-02-01 DIAGNOSIS — R0609 Other forms of dyspnea: Secondary | ICD-10-CM

## 2023-02-01 DIAGNOSIS — M25511 Pain in right shoulder: Secondary | ICD-10-CM | POA: Diagnosis present

## 2023-02-01 DIAGNOSIS — R519 Headache, unspecified: Secondary | ICD-10-CM | POA: Diagnosis not present

## 2023-02-01 DIAGNOSIS — S46911A Strain of unspecified muscle, fascia and tendon at shoulder and upper arm level, right arm, initial encounter: Secondary | ICD-10-CM | POA: Insufficient documentation

## 2023-02-01 DIAGNOSIS — W010XXA Fall on same level from slipping, tripping and stumbling without subsequent striking against object, initial encounter: Secondary | ICD-10-CM | POA: Insufficient documentation

## 2023-02-01 DIAGNOSIS — R079 Chest pain, unspecified: Secondary | ICD-10-CM | POA: Insufficient documentation

## 2023-02-01 DIAGNOSIS — S7002XA Contusion of left hip, initial encounter: Secondary | ICD-10-CM | POA: Insufficient documentation

## 2023-02-01 DIAGNOSIS — Y92129 Unspecified place in nursing home as the place of occurrence of the external cause: Secondary | ICD-10-CM | POA: Insufficient documentation

## 2023-02-01 LAB — CBG MONITORING, ED: Glucose-Capillary: 79 mg/dL (ref 70–99)

## 2023-02-01 NOTE — ED Triage Notes (Signed)
Pt presents to ED for fall, bib [facility] from [place]. EMS report pt "slipped and fell while putting on slippers" pt c/o bilateral leg pain and back pain, pt morbidly obese, told EMS that she was in floor for 1 1/2 hours before staff came to room and called EMS. Pt uses walker at baseline. Denies LOC, denies hitting head, when EMS arrived pt was sitting on buttocks in floor beside bed. Alert and oriented x 4.

## 2023-02-01 NOTE — ED Notes (Signed)
Patient transported to X-ray 

## 2023-02-01 NOTE — Patient Instructions (Signed)
Medication Instructions:  Your physician recommends that you continue on your current medications as directed. Please refer to the Current Medication list given to you today.  *If you need a refill on your cardiac medications before your next appointment, please call your pharmacy*   Lab Work: Today;  -Troponin  If you have labs (blood work) drawn today and your tests are completely normal, you will receive your results only by: MyChart Message (if you have MyChart) OR A paper copy in the mail If you have any lab test that is abnormal or we need to change your treatment, we will call you to review the results.   Testing/Procedures: None   Follow-Up: At [facility], you and your health needs are our priority.  As part of our continuing mission to provide you with exceptional heart care, we have created designated Provider Care Teams.  These Care Teams include your primary Cardiologist (physician) and Advanced Practice Providers (APPs -  Physician Assistants and Nurse Practitioners) who all work together to provide you with the care you need, when you need it.  We recommend signing up for the patient portal called "MyChart".  Sign up information is provided on this After Visit Summary.  MyChart is used to connect with patients for Virtual Visits (Telemedicine).  Patients are able to view lab/test results, encounter notes, upcoming appointments, etc.  Non-urgent messages can be sent to your provider as well.   To learn more about what you can do with MyChart, go to [email or web address].    Your next appointment:   6 month(s)  Provider:   Luane School, MD    Other Instructions

## 2023-02-01 NOTE — Progress Notes (Signed)
Cardiology Office Note  Date: 02/01/2023   ID: Icelyn Eiche, DOB 1950-07-26, MRN [id number]  PCP:  Richardean Chimera, MD  Cardiologist:  Marjo Bicker, MD Electrophysiologist:  None    History of Present Illness: Valerie Wagner is a 72 y.o. female known to have HTN, DM 2, COPD, history of CVA is here for follow-up visit. Accompanied by caretaker.  Patient has sedentary lifestyle. Walks with a walker. She is an Tree surgeon, [employer].  She is active only with the physical therapy sessions every other day.  Earlier, she used to have chest pains when she was upset due to her roommates.  They left but still continues to have similar chest pains.  Occurs 1-2 times per week, lasting for 5 minutes, nonradiating, occurs at rest and sometimes with physical therapy.  Denies other symptoms of orthopnea, PND, syncope, leg swelling and palpitations.  Has some DOE with physical therapy.  LHC in 2012 showed angiographically normal coronaries except for mild luminal irregularities.   Past Medical History:  Diagnosis Date   Anxiety    Asthma    Chest pain    Reassuring workup of her time including negative cardiac catheterization and stress testing   COPD (chronic obstructive pulmonary disease) (HCC)    DDD (degenerative disc disease), lumbar    Depression    DJD (degenerative joint disease)    Essential hypertension, benign    Fibromyalgia    GERD (gastroesophageal reflux disease)    IBS (irritable bowel syndrome)    Mixed hyperlipidemia    Neuropathy    Obesity    Seizure (HCC)    Was on meds for 6 months and had no more seizures. No meds now.   Shortness of breath dyspnea    Stroke (HCC)    Type 2 diabetes mellitus (HCC)    Urinary incontinence     Past Surgical History:  Procedure Laterality Date   CARPAL TUNNEL RELEASE Bilateral    Right   CESAREAN SECTION     CHOLECYSTECTOMY     ESOPHAGEAL DILATION N/A 09/21/2021   Procedure: ESOPHAGEAL DILATION;  Surgeon: Dolores Frame, MD;  [facility]: [facility];  Service: Gastroenterology;  Laterality: N/A;   ESOPHAGOGASTRODUODENOSCOPY (EGD) WITH PROPOFOL N/A 09/21/2021   Procedure: ESOPHAGOGASTRODUODENOSCOPY (EGD) WITH PROPOFOL;  Surgeon: Dolores Frame, MD;  Location: [facility];  Service: Gastroenterology;  Laterality: N/A;   FLEXIBLE SIGMOIDOSCOPY N/A 09/21/2021   Procedure: FLEXIBLE SIGMOIDOSCOPY;  Surgeon: Dolores Frame, MD;  Location: [facility];  Service: Gastroenterology;  Laterality: N/A;  [address] Patient is in [facility]   Hysterectomy -- unknown     Left Thumb Surgery Bilateral    RADIOLOGY WITH ANESTHESIA N/A 06/30/2015   Procedure: RADIOLOGY WITH ANESTHESIA;  Surgeon: Simonne Come, MD;  Location: [facility];  Service: Radiology;  Laterality: N/A;   TONSILLECTOMY      Current Outpatient Medications  Medication Sig Dispense Refill   ACCU-CHEK AVIVA PLUS test strip      acetaminophen (TYLENOL) 325 MG tablet Take 650 mg by mouth every 6 (six) hours as needed for moderate pain. (Patient not taking: Reported on 09/09/2022)     albuterol (VENTOLIN HFA) 108 (90 Base) MCG/ACT inhaler Inhale 2 puffs into the lungs every 6 (six) hours as needed for wheezing or shortness of breath.  (Patient not taking: Reported on 09/09/2022)     aspirin 81 MG chewable tablet Chew 81 mg by mouth daily.     atorvastatin (LIPITOR) 80 MG  tablet Take 80 mg by mouth daily.     benzonatate (TESSALON) 100 MG capsule Take 100 mg by mouth 3 (three) times daily as needed for cough. (Patient not taking: Reported on 09/09/2022)     busPIRone (BUSPAR) 5 MG tablet Take 5 mg by mouth 3 (three) times daily.     Cranberry 450 MG CAPS Take 450 mg by mouth daily.     diphenhydrAMINE (BENADRYL) 50 MG tablet Take 1 tablet 1 hour prior to CT scan 1 tablet 0   Dulaglutide (TRULICITY) 0.75 MG/0.5ML SOPN Inject 0.75 mg into the skin once a week. Sundays.     DULoxetine (CYMBALTA) 60 MG capsule Take 60 mg by mouth 2 (two) times  daily.     fluticasone furoate-vilanterol (BREO ELLIPTA) 200-25 MCG/ACT AEPB Inhale 1 puff into the lungs daily.     furosemide (LASIX) 20 MG tablet Take 20 mg by mouth every Monday, Wednesday, and Friday.     hydrOXYzine (ATARAX) 25 MG tablet Take 25 mg by mouth at bedtime.     Insulin Glargine (BASAGLAR KWIKPEN) 100 UNIT/ML Inject 50 Units into the skin at bedtime.     lisinopril (ZESTRIL) 10 MG tablet Take 10 mg by mouth daily.     loperamide (IMODIUM) 2 MG capsule Take 2-4 mg by mouth 4 (four) times daily as needed for diarrhea or loose stools. (Patient not taking: Reported on 09/09/2022)     magnesium oxide (MAG-OX) 400 MG tablet Take 400 mg by mouth daily.     metFORMIN (GLUCOPHAGE) 1000 MG tablet Take 1,000 mg by mouth 2 (two) times daily with a meal.     methocarbamol (ROBAXIN) 500 MG tablet Take 1 tablet (500 mg total) by mouth every 8 (eight) hours as needed for muscle spasms. (Patient not taking: Reported on 09/09/2022) 20 tablet 0   metoprolol tartrate (LOPRESSOR) 25 MG tablet Take 1 tablet (25 mg total) by mouth 2 (two) times daily. 180 tablet 3   mirabegron ER (MYRBETRIQ) 25 MG TB24 tablet Take 25 mg by mouth daily.     nystatin (MYCOSTATIN/NYSTOP) powder Apply 1 Application topically See admin instructions. Apply topically to the top of right breast twice daily.   Also has a PRN order: Apply to the affected areas every eight hours as needed for redness/rash.     oxyCODONE-acetaminophen (PERCOCET/ROXICET) 5-325 MG tablet Take 1 tablet by mouth every 6 (six) hours as needed for severe pain. (Patient not taking: Reported on 09/09/2022) 8 tablet 0   pantoprazole (PROTONIX) 40 MG tablet Take 40 mg by mouth daily.     Polyethyl Glycol-Propyl Glycol (SYSTANE) 0.4-0.3 % SOLN Place 2 drops into both eyes 2 (two) times daily.     predniSONE (DELTASONE) 50 MG tablet Take 1 tablet 13 hours prior to CT scan. Take 1 tablet 7 hours prior to CT scan. Take 1 tablet 1 hour prior to CT scan. 3 tablet 0    pregabalin (LYRICA) 75 MG capsule Take 75 mg by mouth 2 (two) times daily.     rOPINIRole (REQUIP) 1 MG tablet Take 1 mg by mouth at bedtime.     traMADol (ULTRAM) 50 MG tablet Take 50 mg by mouth in the morning, at noon, and at bedtime.     traZODone (DESYREL) 150 MG tablet Take 150 mg by mouth at bedtime.     No current facility-administered medications for this visit.   Allergies:  Nsaids, Bee venom, Cephalosporins, Chocolate, Darvon [propoxyphene], Ivp dye [iodinated contrast media], Doxycycline, and Penicillins  Social History: The patient  reports that she quit smoking about 40 years ago. Her smoking use included cigarettes. She started smoking about 42 years ago. She has a 2 pack-year smoking history. She has never used smokeless tobacco. She reports that she does not drink alcohol and does not use drugs.   Family History: The patient's family history includes Coronary artery disease in her father; Heart attack in her father.   ROS:  Please see the history of present illness. Otherwise, complete review of systems is positive for none.  All other systems are reviewed and negative.   Physical Exam: VS:  There were no vitals taken for this visit., BMI There is no height or weight on file to calculate BMI.  Wt Readings from Last 3 Encounters:  07/09/22 225 lb 1.4 oz (102.1 kg)  04/13/22 225 lb (102.1 kg)  03/16/22 275 lb 1.4 oz (124.8 kg)    General: Patient appears comfortable at rest. HEENT: Conjunctiva and lids normal, oropharynx clear with moist mucosa. Neck: Supple, no elevated JVP or carotid bruits, no thyromegaly. Lungs: Clear to auscultation, nonlabored breathing at rest. Cardiac: Regular rate and rhythm, no S3 or significant systolic murmur, no pericardial rub. Abdomen: Soft, nontender, no hepatomegaly, bowel sounds present, no guarding or rebound. Extremities: 1+ pitting edema in bilateral lower extremities Skin: Warm and dry. Musculoskeletal: No  kyphosis. Neuropsychiatric: Alert and oriented x3, affect grossly appropriate.  ECG: NSR  Recent Labwork: 07/09/2022: ALT 15; AST 16; Hemoglobin 12.0; Platelets 297 10/26/2022: BUN 20; Creatinine, Ser 0.89; Potassium 4.7; Sodium 137  No results found for: "CHOL", "TRIG", "HDL", "CHOLHDL", "VLDL", "LDLCALC", "LDLDIRECT"   Assessment and Plan: Patient is a 72 year old F known to have HTN, DM 2, COPD, history of CVA was referred to cardiology clinic for evaluation of chest pain.  # Atypical chest pain -Patient has ongoing chronic chest pains for which she went underwent LHC in 2012 that showed angiographically normal coronaries except for mild luminal irregularities. NM stress test from 4/24 was an intermediate risk study ((mild reversible perfusion defects in the apical anterior and apical anterolateral location). CTA cardiac was done in 5/24 to further evaluate, it showed coronary calcium score of 15.9, nondiagnostic study due to breathing artifact. Echo from 4/24 showed normal LVEF and no valvular heart disease.  I believe this chest pains have atypical in nature and not cardiac in origin due to prior LHC from 2012 that was normal.  Increased physical activity daily.  # History CVA with residual paralysis -Continue aspirin 81 mg once daily and atorvastatin 80 mg nightly  # HLD -Continue atorvastatin 80 mg nightly, goal LDL less than 70  # HTN, controlled -Continue lisinopril 10 mg once daily  # DOE likely secondary to deconditioning from morbid obesity rather than HFpEF -P.o. Lasix 20 mg MWF  # OSA not on CPAP -Follow-up PCP.   Medication Adjustments/Labs and Tests Ordered: Current medicines are reviewed at length with the patient today.  Concerns regarding medicines are outlined above.    Disposition:  Follow up 6 months  Signed,  Verne Spurr, MD, 02/01/2023 10:42 AM    [facility] [address] [place], [place] [address]

## 2023-02-01 NOTE — ED Notes (Signed)
Pt was BIB from SNF. Sts she went to get up from bed. Usually able to ambulate with walker. Sts she bent down to grab her shoes when she fell onto her left side. C/o left foot pain, left ankle pain left knee pain, and left ankle pain. All areas tender to touch. No obvious deformity noted. No head/neck trauma. No pain to left arm/shoulder. Pt is alert and oriented x4.

## 2023-02-02 ENCOUNTER — Emergency Department (HOSPITAL_COMMUNITY): Payer: Medicare Other

## 2023-02-02 DIAGNOSIS — S46911A Strain of unspecified muscle, fascia and tendon at shoulder and upper arm level, right arm, initial encounter: Secondary | ICD-10-CM | POA: Diagnosis not present

## 2023-02-02 LAB — TROPONIN T: Troponin T (Highly Sensitive): 19 ng/L — ABNORMAL HIGH (ref 0–14)

## 2023-02-02 MED ORDER — OXYCODONE-ACETAMINOPHEN 5-325 MG PO TABS
1.0000 | ORAL_TABLET | Freq: Once | ORAL | Status: AC
  Administered 2023-02-02: 1 via ORAL
  Filled 2023-02-02: qty 1

## 2023-02-02 NOTE — ED Provider Notes (Signed)
[place] EMERGENCY DEPARTMENT AT [facility] Provider Note   CSN: [id number] Arrival date & time: 02/01/23  2212     History  Chief Complaint  Patient presents with   Marletta Lor    Valerie Wagner is a 72 y.o. female.  Brought to the emergency department from skilled nursing facility after a fall.  Patient reportedly bent over to put on her slippers and fell, injuring her left side.  She presented with left hip and leg pain.  She is not sure if she hit her head.  She reports that she has a chronic headache, it seems to be unchanged.  No neck or back pain.       Home Medications Prior to Admission medications   Medication Sig Start Date End Date Taking? Authorizing Provider  ACCU-CHEK AVIVA PLUS test strip  02/05/20   [provider]  acetaminophen (TYLENOL) 325 MG tablet Take 650 mg by mouth every 6 (six) hours as needed for moderate pain. Patient not taking: Reported on 09/09/2022    [provider]  albuterol (VENTOLIN HFA) 108 (90 Base) MCG/ACT inhaler Inhale 2 puffs into the lungs every 6 (six) hours as needed for wheezing or shortness of breath.  Patient not taking: Reported on 09/09/2022    [provider]  aspirin 81 MG chewable tablet Chew 81 mg by mouth daily.    [provider]  atorvastatin (LIPITOR) 80 MG tablet Take 80 mg by mouth daily. 06/17/20   [provider]  benzonatate (TESSALON) 100 MG capsule Take 100 mg by mouth 3 (three) times daily as needed for cough. Patient not taking: Reported on 09/09/2022    [provider]  busPIRone (BUSPAR) 5 MG tablet Take 5 mg by mouth 3 (three) times daily.    [provider]  Cranberry 450 MG CAPS Take 450 mg by mouth daily.    [provider]  diphenhydrAMINE (BENADRYL) 50 MG tablet Take 1 tablet 1 hour prior to CT scan Patient not taking: Reported on 02/01/2023 10/26/22   Mallipeddi, Vishnu P, MD  Dulaglutide (TRULICITY) 0.75 MG/0.5ML SOPN Inject 0.75 mg  into the skin once a week. Sundays.    [provider]  DULoxetine (CYMBALTA) 60 MG capsule Take 60 mg by mouth 2 (two) times daily. 06/17/20   [provider]  fluticasone furoate-vilanterol (BREO ELLIPTA) 200-25 MCG/ACT AEPB Inhale 1 puff into the lungs daily.    [provider]  furosemide (LASIX) 20 MG tablet Take 20 mg by mouth every Monday, Wednesday, and Friday.    [provider]  hydrOXYzine (ATARAX) 25 MG tablet Take 25 mg by mouth at bedtime. 06/21/22   [provider]  Insulin Glargine (BASAGLAR KWIKPEN) 100 UNIT/ML Inject 50 Units into the skin at bedtime.    [provider]  lisinopril (ZESTRIL) 10 MG tablet Take 10 mg by mouth daily.    [provider]  loperamide (IMODIUM) 2 MG capsule Take 2-4 mg by mouth 4 (four) times daily as needed for diarrhea or loose stools. Patient not taking: Reported on 09/09/2022    [provider]  magnesium oxide (MAG-OX) 400 MG tablet Take 400 mg by mouth daily.    [provider]  metFORMIN (GLUCOPHAGE) 1000 MG tablet Take 1,000 mg by mouth 2 (two) times daily with a meal.    [provider]  methocarbamol (ROBAXIN) 500 MG tablet Take 1 tablet (500 mg total) by mouth every 8 (eight) hours as needed for muscle spasms.  Patient not taking: Reported on 09/09/2022 07/09/22   Gloris Manchester, MD  metoprolol tartrate (LOPRESSOR) 25 MG tablet Take 1 tablet (25 mg total) by mouth 2 (two) times daily. 11/01/22 02/01/23  Mallipeddi, Vishnu P, MD  mirabegron ER (MYRBETRIQ) 25 MG TB24 tablet Take 25 mg by mouth daily.    [provider]  nystatin (MYCOSTATIN/NYSTOP) powder Apply 1 Application topically See admin instructions. Apply topically to the top of right breast twice daily.   Also has a PRN order: Apply to the affected areas every eight hours as needed for redness/rash.    [provider]  oxyCODONE-acetaminophen (PERCOCET/ROXICET) 5-325 MG tablet Take 1 tablet  by mouth every 6 (six) hours as needed for severe pain. Patient not taking: Reported on 09/09/2022 04/13/22   Prosperi, Christian H, PA-C  pantoprazole (PROTONIX) 40 MG tablet Take 40 mg by mouth daily.    [provider]  Polyethyl Glycol-Propyl Glycol (SYSTANE) 0.4-0.3 % SOLN Place 2 drops into both eyes 2 (two) times daily.    [provider]  predniSONE (DELTASONE) 50 MG tablet Take 1 tablet 13 hours prior to CT scan. Take 1 tablet 7 hours prior to CT scan. Take 1 tablet 1 hour prior to CT scan. Patient not taking: Reported on 02/01/2023 10/26/22   Mallipeddi, Vishnu P, MD  pregabalin (LYRICA) 75 MG capsule Take 75 mg by mouth 2 (two) times daily.    [provider]  rOPINIRole (REQUIP) 1 MG tablet Take 1 mg by mouth at bedtime.    [provider]  traMADol (ULTRAM) 50 MG tablet Take 50 mg by mouth in the morning, at noon, and at bedtime. 04/27/20   [provider]  traZODone (DESYREL) 150 MG tablet Take 150 mg by mouth at bedtime.    [provider]      Allergies    Nsaids, Bee venom, Cephalosporins, Chocolate, Darvon [propoxyphene], Ivp dye [iodinated contrast media], Doxycycline, and Penicillins    Review of Systems   Review of Systems  Physical Exam Updated Vital Signs BP (!) 151/83   Pulse (!) 51   Temp 98.2 F (36.8 C) (Oral)   Resp 16   Ht 5\' 3"  (1.6 m)   Wt 102.1 kg   SpO2 95%   BMI 39.87 kg/m  Physical Exam Vitals and nursing note reviewed.  Constitutional:      General: She is not in acute distress.    Appearance: She is well-developed.  HENT:     Head: Normocephalic and atraumatic.     Mouth/Throat:     Mouth: Mucous membranes are moist.  Eyes:     General: Vision grossly intact. Gaze aligned appropriately.     Extraocular Movements: Extraocular movements intact.     Conjunctiva/sclera: Conjunctivae normal.  Cardiovascular:     Rate and Rhythm: Normal rate and regular rhythm.     Pulses: Normal pulses.      Heart sounds: Normal heart sounds, S1 normal and S2 normal. No murmur heard.    No friction rub. No gallop.  Pulmonary:     Effort: Pulmonary effort is normal. No respiratory distress.     Breath sounds: Normal breath sounds.  Abdominal:     General: Bowel sounds are normal.     Palpations: Abdomen is soft.     Tenderness: There is no abdominal tenderness. There is no guarding or rebound.     Hernia: No hernia is present.  Musculoskeletal:        General: No swelling.  Right shoulder: Tenderness present. No swelling or deformity. Normal range of motion.     Cervical back: Full passive range of motion without pain, normal range of motion and neck supple. No spinous process tenderness or muscular tenderness. Normal range of motion.     Left hip: No deformity. Decreased range of motion (Pain with active range of motion).     Left knee: No swelling, deformity or effusion. Tenderness present.     Right lower leg: No edema.     Left lower leg: No edema.     Left ankle: No swelling or deformity. Tenderness present.  Skin:    General: Skin is warm and dry.     Capillary Refill: Capillary refill takes less than 2 seconds.     Findings: No ecchymosis, erythema, rash or wound.  Neurological:     General: No focal deficit present.     Mental Status: She is alert and oriented to person, place, and time.     GCS: GCS eye subscore is 4. GCS verbal subscore is 5. GCS motor subscore is 6.     Cranial Nerves: Cranial nerves 2-12 are intact.     Sensory: Sensation is intact.     Motor: Motor function is intact.     Coordination: Coordination is intact.  Psychiatric:        Attention and Perception: Attention normal.        Mood and Affect: Mood normal.        Speech: Speech normal.        Behavior: Behavior normal.     ED Results / Procedures / Treatments   Labs (all labs ordered are listed, but only abnormal results are displayed) Labs Reviewed  CBG MONITORING, ED     EKG None  Radiology CT Hip Left Wo Contrast  Result Date: 02/02/2023 CLINICAL DATA:  Recent slip and fall with left hip pain, initial encounter EXAM: CT OF THE LEFT HIP WITHOUT CONTRAST TECHNIQUE: Multidetector CT imaging of the left hip was performed according to the standard protocol. Multiplanar CT image reconstructions were also generated. RADIATION DOSE REDUCTION: This exam was performed according to the departmental dose-optimization program which includes automated exposure control, adjustment of the mA and/or kV according to patient size and/or use of iterative reconstruction technique. COMPARISON:  Plain film from the previous day. FINDINGS: Bones/Joint/Cartilage No acute fracture or dislocation is noted. No joint effusion is seen. Ligaments Suboptimally assessed by CT. Muscles and Tendons Surrounding musculature shows a fluid collection measuring 4.3 x 3.4 cm in greatest transverse and AP dimensions respectively. This lies within the left pectineus muscle and has been present on multiple previous exams dating back to 2015. This is consistent with a benign etiology such as a ganglion cyst. Soft tissues Surrounding soft tissue structures show no acute abnormality. IMPRESSION: No acute fracture is noted. Stable fluid collection within the left pectineus muscle being back to 2015. This is consistent with a benign etiology. Electronically Signed   By: Alcide Clever M.D.   On: 02/02/2023 01:42   DG Shoulder Right  Result Date: 02/02/2023 CLINICAL DATA:  Fall with right shoulder pain. EXAM: RIGHT SHOULDER - 2+ VIEW COMPARISON:  03/16/2022. FINDINGS: There is no evidence of fracture or dislocation. Mild degenerative changes are present at the glenohumeral joint. Soft tissues are unremarkable. IMPRESSION: No acute fracture or dislocation. Electronically Signed   By: Thornell Sartorius M.D.   On: 02/02/2023 01:40   CT HEAD WO CONTRAST ( )  Result Date: 02/02/2023 CLINICAL  DATA:  Recent slip and fall  with headaches , initial encounter EXAM: CT HEAD WITHOUT CONTRAST TECHNIQUE: Contiguous axial images were obtained from the base of the skull through the vertex without intravenous contrast. RADIATION DOSE REDUCTION: This exam was performed according to the departmental dose-optimization program which includes automated exposure control, adjustment of the mA and/or kV according to patient size and/or use of iterative reconstruction technique. COMPARISON:  06/25/2015 FINDINGS: Brain: No evidence of acute infarction, hemorrhage, hydrocephalus, extra-axial collection or mass lesion/mass effect. Mild atrophic changes are noted. Vascular: No hyperdense vessel or unexpected calcification. Skull: Normal. Negative for fracture or focal lesion. Sinuses/Orbits: No acute finding. Other: None. IMPRESSION: Mild atrophic changes without acute abnormality. Electronically Signed   By: Alcide Clever M.D.   On: 02/02/2023 01:34   DG Foot 2 Views Left  Result Date: 02/01/2023 CLINICAL DATA:  Recent fall with foot pain, initial encounter EXAM: LEFT FOOT - 2 VIEW COMPARISON:  None Available. FINDINGS: Calcaneal spurring is noted. Tarsal degenerative changes are seen. No acute fracture or dislocation is noted. No soft tissue changes are noted. IMPRESSION: Degenerative changes without acute abnormality. Electronically Signed   By: Alcide Clever M.D.   On: 02/01/2023 23:43   DG Knee Complete 4 Views Left  Result Date: 02/01/2023 CLINICAL DATA:  Fall with knee pain EXAM: LEFT KNEE - COMPLETE 4+ VIEW COMPARISON:  03/16/2022 FINDINGS: No fracture or malalignment. Tricompartment arthritis of the knee with mild to moderate medial joint space narrowing. No sizable effusion IMPRESSION: No acute osseous abnormality. Electronically Signed   By: Jasmine Pang M.D.   On: 02/01/2023 23:29   DG Ankle 2 Views Left  Result Date: 02/01/2023 CLINICAL DATA:  Fall EXAM: LEFT ANKLE - 2 VIEW COMPARISON:  None Available. FINDINGS: No fracture or  malalignment. Mild degenerative changes. Large plantar calcaneal spur IMPRESSION: No acute osseous abnormality. Electronically Signed   By: Jasmine Pang M.D.   On: 02/01/2023 23:28   DG Hip Unilat With Pelvis 2-3 Views Left  Result Date: 02/01/2023 CLINICAL DATA:  Fall with left-sided hip pain EXAM: DG HIP (WITH OR WITHOUT PELVIS) 2-3V LEFT COMPARISON:  None Available. FINDINGS: SI joints are non widened. Pubic symphysis and rami appear intact. No fracture or malalignment. Mild bilateral hip degenerative change IMPRESSION: No acute osseous abnormality. Electronically Signed   By: Jasmine Pang M.D.   On: 02/01/2023 23:26    Procedures Procedures    Medications Ordered in ED Medications  oxyCODONE-acetaminophen (PERCOCET/ROXICET) 5-325 MG per tablet 1 tablet (has no administration in time range)    ED Course/ Medical Decision Making/ A&P                                 Medical Decision Making Amount and/or Complexity of Data Reviewed Radiology: ordered.   Presents after a fall.  Patient with pain in the right shoulder, left leg.  She also has a headache.  Differential diagnosis considered includes, but not limited to: Intracranial head injury; concussion; fracture; sprain; strain; contusion  X-ray right shoulder unremarkable.  No deformity on exam.  X-ray of left hip, knee, ankle negative.  No obvious deformity on exam.  No redness, swelling or signs of infection.  Patient can continues to have left hip pain with movement.  She underwent CT scan for this, chronic changes seen, no evidence of fracture.        Final Clinical Impression(s) / ED Diagnoses Final diagnoses:  Strain of right shoulder, initial encounter  Contusion of hip and thigh, left, initial encounter    Rx / DC Orders ED Discharge Orders     None         , Canary Brim, MD 02/02/23 [phone]

## 2023-02-02 NOTE — ED Notes (Signed)
EMS at bedside to transport pt back to facility. SNF updated on pts departure at this time

## 2023-02-02 NOTE — ED Notes (Signed)
Patient transported to CT 

## 2023-02-03 ENCOUNTER — Other Ambulatory Visit: Payer: Self-pay

## 2023-02-03 DIAGNOSIS — R079 Chest pain, unspecified: Secondary | ICD-10-CM

## 2023-02-06 ENCOUNTER — Other Ambulatory Visit (HOSPITAL_COMMUNITY)
Admission: RE | Admit: 2023-02-06 | Discharge: 2023-02-06 | Disposition: A | Discharge status: Home / Self Care | Payer: Medicare Other | Source: Ambulatory Visit | Attending: Internal Medicine | Admitting: Internal Medicine

## 2023-02-06 DIAGNOSIS — R079 Chest pain, unspecified: Secondary | ICD-10-CM | POA: Diagnosis present

## 2023-02-06 LAB — TROPONIN I (HIGH SENSITIVITY): Troponin I (High Sensitivity): 4 ng/L (ref <18)

## 2023-03-14 ENCOUNTER — Encounter (HOSPITAL_BASED_OUTPATIENT_CLINIC_OR_DEPARTMENT_OTHER): Payer: Self-pay

## 2023-03-14 DIAGNOSIS — R5383 Other fatigue: Secondary | ICD-10-CM

## 2023-03-14 DIAGNOSIS — R0683 Snoring: Secondary | ICD-10-CM

## 2023-03-14 DIAGNOSIS — G4710 Hypersomnia, unspecified: Secondary | ICD-10-CM

## 2023-03-14 DIAGNOSIS — G4733 Obstructive sleep apnea (adult) (pediatric): Secondary | ICD-10-CM

## 2023-04-04 ENCOUNTER — Ambulatory Visit (HOSPITAL_BASED_OUTPATIENT_CLINIC_OR_DEPARTMENT_OTHER): Payer: Medicare Other | Attending: Family Medicine | Admitting: Internal Medicine

## 2023-04-04 DIAGNOSIS — R5383 Other fatigue: Secondary | ICD-10-CM

## 2023-04-04 DIAGNOSIS — R0683 Snoring: Secondary | ICD-10-CM

## 2023-04-04 DIAGNOSIS — G4710 Hypersomnia, unspecified: Secondary | ICD-10-CM

## 2023-04-04 DIAGNOSIS — G4733 Obstructive sleep apnea (adult) (pediatric): Secondary | ICD-10-CM

## 2023-04-19 ENCOUNTER — Ambulatory Visit (HOSPITAL_BASED_OUTPATIENT_CLINIC_OR_DEPARTMENT_OTHER): Payer: Medicare Other | Attending: Internal Medicine | Admitting: Internal Medicine

## 2023-04-19 VITALS — Ht 63.0 in | Wt 228.0 lb

## 2023-04-27 ENCOUNTER — Other Ambulatory Visit (HOSPITAL_BASED_OUTPATIENT_CLINIC_OR_DEPARTMENT_OTHER): Payer: Self-pay

## 2023-04-27 DIAGNOSIS — R0681 Apnea, not elsewhere classified: Secondary | ICD-10-CM

## 2023-04-27 DIAGNOSIS — R5383 Other fatigue: Secondary | ICD-10-CM

## 2023-04-27 DIAGNOSIS — G4710 Hypersomnia, unspecified: Secondary | ICD-10-CM

## 2023-04-27 DIAGNOSIS — R0683 Snoring: Secondary | ICD-10-CM

## 2023-05-10 ENCOUNTER — Ambulatory Visit (HOSPITAL_BASED_OUTPATIENT_CLINIC_OR_DEPARTMENT_OTHER): Payer: Medicare Other | Attending: Family Medicine | Admitting: Internal Medicine

## 2023-05-10 VITALS — Ht 63.0 in | Wt 241.0 lb

## 2023-05-10 DIAGNOSIS — R5383 Other fatigue: Secondary | ICD-10-CM | POA: Insufficient documentation

## 2023-05-10 DIAGNOSIS — R0683 Snoring: Secondary | ICD-10-CM

## 2023-05-10 DIAGNOSIS — G4733 Obstructive sleep apnea (adult) (pediatric): Secondary | ICD-10-CM | POA: Diagnosis present

## 2023-05-10 DIAGNOSIS — R0681 Apnea, not elsewhere classified: Secondary | ICD-10-CM

## 2023-05-10 DIAGNOSIS — G4710 Hypersomnia, unspecified: Secondary | ICD-10-CM | POA: Diagnosis not present

## 2023-05-10 DIAGNOSIS — G4731 Primary central sleep apnea: Secondary | ICD-10-CM | POA: Insufficient documentation

## 2023-05-13 DIAGNOSIS — R0683 Snoring: Secondary | ICD-10-CM | POA: Diagnosis not present

## 2023-05-13 NOTE — Procedures (Signed)
Patient Name: Valerie Wagner, Valerie Wagner Date: 05/10/2023 Gender: Female D.O.B: 02/23/1951 Age (years): 32 Referring Provider: Richardean Chimera Height (inches): 53 Interpreting Physician: Jetty Duhamel MD, ABSM Weight (lbs): 241 RPSGT: Shelah Lewandowsky BMI: 60 MRN: [id number] Neck Size: 17.50  CLINICAL INFORMATION The patient is referred for a split night study with BPAP.  MEDICATIONS Medications self-administered by patient taken the night of the study : none reported  SLEEP STUDY TECHNIQUE As per the AASM Manual for the Scoring of Sleep and Associated Events v2.3 (April 2016) with a hypopnea requiring 4% desaturations.  The channels recorded and monitored were frontal, central and occipital EEG, electrooculogram (EOG), submentalis EMG (chin), nasal and oral airflow, thoracic and abdominal wall motion, anterior tibialis EMG, snore microphone, electrocardiogram, and pulse oximetry. Bi-level positive airway pressure (BiPAP) was initiated when the patient met split night criteria and was titrated according to treat sleep-disordered breathing.  RESPIRATORY PARAMETERS Diagnostic  Total AHI (/hr): 16.6 RDI (/hr): 17.8 OA Index (/hr): 0.9 CA Index (/hr): 0.0 REM AHI (/hr): 81.7 NREM AHI (/hr): 7.9 Supine AHI (/hr): 16.6 Non-supine AHI (/hr): N/A Min O2 Sat (%): 74.0 Mean O2 (%): 91.3 Time below 88% (min): 17.4   Titration  Optimal IPAP Pressure (cm): 21 Optimal EPAP Pressure (cm): 17 AHI at Optimal Pressure (/hr): 33.7 Min O2 at Optimal Pressure (%): 93.0 Sleep % at Optimal (%): 100 Supine % at Optimal (%): 100   SLEEP ARCHITECTURE The study was initiated at 10:19:16 PM and terminated at 5:36:56 AM. The total recorded time was 437.7 minutes. EEG confirmed total sleep time was 430.2 minutes yielding a sleep efficiency of 98.3%. Sleep onset after lights out was 2.0 minutes with a REM latency of 161.0 minutes. The patient spent 0.7% of the night in stage N1 sleep, 81.9% in stage N2 sleep,  0.0% in stage N3 and 17.4% in REM. Wake after sleep onset (WASO) was 5.5 minutes. The Arousal Index was 4.5/hour.  LEG MOVEMENT DATA The total Periodic Limb Movements of Sleep (PLMS) were 0. The PLMS index was 0.0 .  CARDIAC DATA The 2 lead EKG demonstrated sinus rhythm. The mean heart rate was 58.0 beats per minute. Other EKG findings include: PVCs.  IMPRESSIONS - Moderate obstructive sleep apnea occurred during the diagnostic portion of the study (AHI = 16.6 /hour). -  CPAP titration resulted in treatment-emergent central apneas and titration was changed to BIPAP, then to BIPAP ST. An optimal PAP pressure was selected for this patient ( 21 /17 cm of water, back- up rate 15). - Central sleep apnea occurred during the therapeutic portion of the study.  - Oxygen desaturation was noted during the diagnostic portion of the study (Min O2 = 74.0%). Oxygen saturation on BIPAP ST 21/17/ BR 15- minimum 93%. - The patient snored with soft snoring volume during the diagnostic portion of the study. - EKG findings include PVCs. - Clinically significant periodic limb movements of sleep did not occur during the study.  DIAGNOSIS - Obstructive Sleep Apnea (G47.33) - Treatment- emergent Central Sleep apnea  RECOMMENDATIONS - Trial of BiPAP ST therapy on 21/17 cm H2O, Back Up Rate 15 with a Small-Medium size [employer] Full Face Evora Full mask and heated humidification. - Be careful with alcohol, sedatives and other CNS depressants that may worsen sleep apnea and disrupt normal sleep architecture. - Sleep hygiene should be reviewed to assess factors that may improve sleep quality. - Weight management and regular exercise should be initiated or continued.  [Electronically signed] 05/13/2023  04:18 PM  Jetty Duhamel MD, ABSM Diplomate, American Board of Sleep Medicine NPI: [id number]                        Jetty Duhamel Diplomate, American Board of Sleep  Medicine  ELECTRONICALLY SIGNED ON:  05/13/2023, 4:11 PM [facility] PH: [phone]   FX: [phone] ACCREDITED BY THE AMERICAN ACADEMY OF SLEEP MEDICINE

## 2023-08-09 ENCOUNTER — Ambulatory Visit: Payer: Medicare Other | Admitting: Internal Medicine

## 2023-09-03 ENCOUNTER — Emergency Department (HOSPITAL_COMMUNITY)
Admission: EM | Admit: 2023-09-03 | Discharge: 2023-09-03 | Attending: Emergency Medicine | Admitting: Emergency Medicine

## 2023-09-03 ENCOUNTER — Other Ambulatory Visit: Payer: Self-pay

## 2023-09-03 ENCOUNTER — Emergency Department (HOSPITAL_COMMUNITY)

## 2023-09-03 DIAGNOSIS — J069 Acute upper respiratory infection, unspecified: Secondary | ICD-10-CM | POA: Insufficient documentation

## 2023-09-03 DIAGNOSIS — J449 Chronic obstructive pulmonary disease, unspecified: Secondary | ICD-10-CM | POA: Diagnosis not present

## 2023-09-03 DIAGNOSIS — Z79899 Other long term (current) drug therapy: Secondary | ICD-10-CM | POA: Diagnosis not present

## 2023-09-03 DIAGNOSIS — I10 Essential (primary) hypertension: Secondary | ICD-10-CM | POA: Insufficient documentation

## 2023-09-03 DIAGNOSIS — Z7982 Long term (current) use of aspirin: Secondary | ICD-10-CM | POA: Insufficient documentation

## 2023-09-03 DIAGNOSIS — R059 Cough, unspecified: Secondary | ICD-10-CM | POA: Diagnosis present

## 2023-09-03 LAB — RESP PANEL BY RT-PCR (RSV, FLU A&B, COVID)  RVPGX2
Influenza A by PCR: NEGATIVE
Influenza B by PCR: NEGATIVE
Resp Syncytial Virus by PCR: NEGATIVE
SARS Coronavirus 2 by RT PCR: NEGATIVE

## 2023-09-03 MED ORDER — ALBUTEROL SULFATE HFA 108 (90 BASE) MCG/ACT IN AERS
2.0000 | INHALATION_SPRAY | Freq: Once | RESPIRATORY_TRACT | Status: AC
  Administered 2023-09-03: 2 via RESPIRATORY_TRACT
  Filled 2023-09-03: qty 6.7

## 2023-09-03 NOTE — Discharge Instructions (Signed)
 Tylenol for any fever or aches.  Use albuterol inhaler every 4-6 hours as needed for wheezing or shortness of breath

## 2023-09-03 NOTE — ED Provider Notes (Signed)
 [place] [facility]ERGENCY DEPARTMENT AT [facility] Provider Note   CSN: [id number] Arrival date & time: 09/03/23  7829     History {Add pertinent medical, surgical, social history, OB history to HPI:1} Chief Complaint  Patient presents with   Shortness of Breath    Journiee Feldkamp is a 73 y.o. female.  She has history of COPD and hypertension.  She complains of cough and congestion for a few days.   Shortness of Breath      Home Medications Prior to Admission medications   Medication Sig Start Date End Date Taking? Authorizing Provider  ACCU-CHEK AVIVA PLUS test strip  02/05/20   [provider]  acetaminophen (TYLENOL) 325 MG tablet Take 650 mg by mouth every 6 (six) hours as needed for moderate pain. Patient not taking: Reported on 09/09/2022    [provider]  albuterol (VENTOLIN HFA) 108 (90 Base) MCG/ACT inhaler Inhale 2 puffs into the lungs every 6 (six) hours as needed for wheezing or shortness of breath.  Patient not taking: Reported on 09/09/2022    [provider]  aspirin 81 MG chewable tablet Chew 81 mg by mouth daily.    [provider]  atorvastatin (LIPITOR) 80 MG tablet Take 80 mg by mouth daily. 06/17/20   [provider]  benzonatate (TESSALON) 100 MG capsule Take 100 mg by mouth 3 (three) times daily as needed for cough. Patient not taking: Reported on 09/09/2022    [provider]  busPIRone (BUSPAR) 5 MG tablet Take 5 mg by mouth 3 (three) times daily.    [provider]  Cranberry 450 MG CAPS Take 450 mg by mouth daily.    [provider]  diphenhydrAMINE (BENADRYL) 50 MG tablet Take 1 tablet 1 hour prior to CT scan Patient not taking: Reported on 02/01/2023 10/26/22   Mallipeddi, Vishnu P, MD  Dulaglutide (TRULICITY) 0.75 MG/0.5ML SOPN Inject 0.75 mg into the skin once a week. Sundays.    [provider]  DULoxetine (CYMBALTA) 60 MG capsule Take 60 mg by mouth 2 (two) times  daily. 06/17/20   [provider]  fluticasone furoate-vilanterol (BREO ELLIPTA) 200-25 MCG/ACT AEPB Inhale 1 puff into the lungs daily.    [provider]  furosemide (LASIX) 20 MG tablet Take 20 mg by mouth every Monday, Wednesday, and Friday.    [provider]  hydrOXYzine (ATARAX) 25 MG tablet Take 25 mg by mouth at bedtime. 06/21/22   [provider]  Insulin Glargine (BASAGLAR KWIKPEN) 100 UNIT/ML Inject 50 Units into the skin at bedtime.    [provider]  lisinopril (ZESTRIL) 10 MG tablet Take 10 mg by mouth daily.    [provider]  loperamide (IMODIUM) 2 MG capsule Take 2-4 mg by mouth 4 (four) times daily as needed for diarrhea or loose stools. Patient not taking: Reported on 09/09/2022    [provider]  magnesium oxide (MAG-OX) 400 MG tablet Take 400 mg by mouth daily.    [provider]  metFORMIN (GLUCOPHAGE) 1000 MG tablet Take 1,000 mg by mouth 2 (two) times daily with a meal.    [provider]  methocarbamol (ROBAXIN) 500 MG tablet Take 1 tablet (500 mg total) by mouth every 8 (eight) hours as needed for muscle spasms. Patient not taking: Reported on 09/09/2022 07/09/22   Gloris Manchester, MD  metoprolol tartrate (LOPRESSOR) 25 MG tablet Take 1 tablet (25 mg total) by mouth 2 (two) times daily. 11/01/22 02/01/23  Mallipeddi, Vishnu P, MD  mirabegron ER (MYRBETRIQ) 25 MG TB24 tablet Take 25 mg by mouth daily.    [provider]  nystatin (MYCOSTATIN/NYSTOP) powder Apply 1 Application topically See admin instructions. Apply topically to the top of right breast twice daily.   Also has a PRN order: Apply to the affected areas every eight hours as needed for redness/rash.    [provider]  oxyCODONE-acetaminophen (PERCOCET/ROXICET) 5-325 MG tablet Take 1 tablet by mouth every 6 (six) hours as needed for severe pain. Patient not taking: Reported on 09/09/2022 04/13/22   Prosperi, Christian H,  PA-C  pantoprazole (PROTONIX) 40 MG tablet Take 40 mg by mouth daily.    [provider]  Polyethyl Glycol-Propyl Glycol (SYSTANE) 0.4-0.3 % SOLN Place 2 drops into both eyes 2 (two) times daily.    [provider]  predniSONE (DELTASONE) 50 MG tablet Take 1 tablet 13 hours prior to CT scan. Take 1 tablet 7 hours prior to CT scan. Take 1 tablet 1 hour prior to CT scan. Patient not taking: Reported on 02/01/2023 10/26/22   Mallipeddi, Vishnu P, MD  pregabalin (LYRICA) 75 MG capsule Take 75 mg by mouth 2 (two) times daily.    [provider]  rOPINIRole (REQUIP) 1 MG tablet Take 1 mg by mouth at bedtime.    [provider]  traMADol (ULTRAM) 50 MG tablet Take 50 mg by mouth in the morning, at noon, and at bedtime. 04/27/20   [provider]  traZODone (DESYREL) 150 MG tablet Take 150 mg by mouth at bedtime.    [provider]      Allergies    Nsaids, Bee venom, Cephalosporins, Chocolate, Darvon [propoxyphene], Ivp dye [iodinated contrast media], Doxycycline, and Penicillins    Review of Systems   Review of Systems  Respiratory:  Positive for shortness of breath.     Physical Exam Updated Vital Signs BP (!) 146/84 (BP Location: Right Arm)   Pulse (!) 56   Temp 98.5 F (36.9 C) (Oral)   Resp 15   SpO2 95%  Physical Exam  ED Results / Procedures / Treatments   Labs (all labs ordered are listed, but only abnormal results are displayed) Labs Reviewed  RESP PANEL BY RT-PCR (RSV, FLU A&B, COVID)  RVPGX2    EKG None  Radiology DG Chest Port 1 View Result Date: 09/03/2023 CLINICAL DATA:  Short of breath and cough.  Headache and back ache. EXAM: PORTABLE CHEST 1 VIEW COMPARISON:  07/09/2022 and prior studies. FINDINGS: Cardiac silhouette normal in size.  No mediastinal or hilar masses. Lungs are clear.  No pleural effusion or pneumothorax. Skeletal structures are grossly intact. IMPRESSION: No active disease. Electronically Signed   By:  Amie Portland M.D.   On: 09/03/2023 09:12    Procedures Procedures  {Document cardiac monitor, telemetry assessment procedure when appropriate:1}  Medications Ordered in ED Medications  albuterol (VENTOLIN HFA) 108 (90 Base) MCG/ACT inhaler 2 puff (has no administration in time range)    ED Course/ Medical Decision Making/ A&P   {   Click here for ABCD2, HEART and other calculatorsREFRESH Note before signing :1}                              Medical Decision Making Amount and/or Complexity of Data Reviewed Radiology: ordered. ECG/medicine tests: ordered.  Risk Prescription drug management.   Patient with a URI and bronchospasm.  She is nontoxic  and will be discharged back to the nursing home with a albuterol inhaler  {Document critical care time when appropriate:1} {Document review of labs and clinical decision tools ie heart score, Chads2Vasc2 etc:1}  {Document your independent review of radiology images, and any outside records:1} {Document your discussion with family members, caretakers, and with consultants:1} {Document social determinants of health affecting pt's care:1} {Document your decision making why or why not admission, treatments were needed:1} Final Clinical Impression(s) / ED Diagnoses Final diagnoses:  URI, acute    Rx / DC Orders ED Discharge Orders     None

## 2023-09-03 NOTE — ED Notes (Addendum)
 [facility] states they will pick pt up from the ED as pt is up for discharge

## 2023-09-03 NOTE — ED Triage Notes (Signed)
 Pt arrived from [facility] via [facility] c/o shob, cough, HA and backache rates pain 10/10. SpO2 98% on RA. Hx of COPD, CHF, and, per pt, degenerative spine syndrome. Per EMS, facility stated flu is going around at there facility.

## 2023-09-19 ENCOUNTER — Other Ambulatory Visit (HOSPITAL_COMMUNITY): Payer: Self-pay

## 2023-09-19 ENCOUNTER — Ambulatory Visit (HOSPITAL_COMMUNITY)
Admission: RE | Admit: 2023-09-19 | Discharge: 2023-09-19 | Disposition: A | Discharge status: Home / Self Care | Source: Ambulatory Visit

## 2023-09-19 DIAGNOSIS — M545 Low back pain, unspecified: Secondary | ICD-10-CM

## 2023-10-12 ENCOUNTER — Encounter: Payer: Self-pay | Admitting: Internal Medicine

## 2023-10-12 ENCOUNTER — Other Ambulatory Visit

## 2023-10-12 ENCOUNTER — Ambulatory Visit: Payer: Medicare Other | Attending: Internal Medicine | Admitting: Internal Medicine

## 2023-10-12 VITALS — BP 124/60 | HR 64 | Ht 63.0 in | Wt 288.0 lb

## 2023-10-12 DIAGNOSIS — R42 Dizziness and giddiness: Secondary | ICD-10-CM | POA: Diagnosis present

## 2023-10-12 DIAGNOSIS — R079 Chest pain, unspecified: Secondary | ICD-10-CM | POA: Insufficient documentation

## 2023-10-12 DIAGNOSIS — I2510 Atherosclerotic heart disease of native coronary artery without angina pectoris: Secondary | ICD-10-CM | POA: Diagnosis present

## 2023-10-12 MED ORDER — RANOLAZINE ER 500 MG PO TB12: 500.0000 mg | ORAL_TABLET | Freq: Two times a day (BID) | ORAL | 3 refills | Status: AC

## 2023-10-12 MED ORDER — NITROGLYCERIN 0.4 MG SL SUBL: 0.4000 mg | SUBLINGUAL_TABLET | SUBLINGUAL | 3 refills | Status: AC | PRN

## 2023-10-12 NOTE — Progress Notes (Signed)
 Cardiology Office Note  Date: 10/12/2023   ID: [name], [name] 1950-07-20, MRN [id number]  PCP:  Leesa Pulling, MD  Cardiologist:  Patra Gherardi P Victoriya Pol, MD Electrophysiologist:  None    History of Present Illness: Avana Wagner is a 73 y.o. female known to have HTN, DM 2, COPD, history of CVA is here for follow-up visit. Accompanied by caretaker.  Patient has sedentary lifestyle. Walks with a walker. She is an Tree surgeon, [employer].  She is active only with the physical therapy sessions every other day.  Chronic chest pains for more than a decade.  She gets chest heaviness with walking and when she gets upset.  LHC in 2012 showed no CAD.  Does not have any other symptoms of presyncope, syncope, palpitations, leg swelling and DOE.  Has stable SOB.  She reports having exertional dizziness.  LHC in 2012 showed angiographically normal coronaries except for mild luminal irregularities.   Past Medical History:  Diagnosis Date   Anxiety    Asthma    Chest pain    Reassuring workup of her time including negative cardiac catheterization and stress testing   COPD (chronic obstructive pulmonary disease) (HCC)    DDD (degenerative disc disease), lumbar    Depression    DJD (degenerative joint disease)    Essential hypertension, benign    Fibromyalgia    GERD (gastroesophageal reflux disease)    IBS (irritable bowel syndrome)    Mixed hyperlipidemia    Neuropathy    Obesity    Seizure (HCC)    Was on meds for 6 months and had no more seizures. No meds now.   Shortness of breath dyspnea    Stroke (HCC)    Type 2 diabetes mellitus (HCC)    Urinary incontinence     Past Surgical History:  Procedure Laterality Date   CARPAL TUNNEL RELEASE Bilateral    Right   CESAREAN SECTION     CHOLECYSTECTOMY     ESOPHAGEAL DILATION N/A 09/21/2021   Procedure: ESOPHAGEAL DILATION;  Surgeon: Urban Garden, MD;  [facility]: [facility];  Service: Gastroenterology;  Laterality: N/A;    ESOPHAGOGASTRODUODENOSCOPY (EGD) WITH PROPOFOL  N/A 09/21/2021   Procedure: ESOPHAGOGASTRODUODENOSCOPY (EGD) WITH PROPOFOL ;  Surgeon: Urban Garden, MD;  Location: [facility];  Service: Gastroenterology;  Laterality: N/A;   FLEXIBLE SIGMOIDOSCOPY N/A 09/21/2021   Procedure: FLEXIBLE SIGMOIDOSCOPY;  Surgeon: Urban Garden, MD;  Location: [facility];  Service: Gastroenterology;  Laterality: N/A;  [address] Patient is in [facility]   Hysterectomy -- unknown     Left Thumb Surgery Bilateral    RADIOLOGY WITH ANESTHESIA N/A 06/30/2015   Procedure: RADIOLOGY WITH ANESTHESIA;  Surgeon: Robbi Childs, MD;  Location: [facility];  Service: Radiology;  Laterality: N/A;   TONSILLECTOMY      Current Outpatient Medications  Medication Sig Dispense Refill   ACCU-CHEK AVIVA PLUS test strip      aspirin 81 MG chewable tablet Chew 81 mg by mouth daily.     atorvastatin (LIPITOR) 80 MG tablet Take 80 mg by mouth daily.     busPIRone (BUSPAR) 5 MG tablet Take 5 mg by mouth 3 (three) times daily.     Cranberry 450 MG CAPS Take 450 mg by mouth daily.     Dulaglutide (TRULICITY) 0.75 MG/0.5ML SOPN Inject 0.75 mg into the skin once a week. Sundays.     DULoxetine (CYMBALTA) 60 MG capsule Take 60 mg by mouth 2 (two) times daily.  fluticasone furoate-vilanterol (BREO ELLIPTA) 200-25 MCG/ACT AEPB Inhale 1 puff into the lungs daily.     furosemide (LASIX) 20 MG tablet Take 20 mg by mouth every Monday, Wednesday, and Friday.     hydrOXYzine (ATARAX) 25 MG tablet Take 25 mg by mouth at bedtime.     Insulin  Glargine (BASAGLAR  KWIKPEN) 100 UNIT/ML Inject 50 Units into the skin at bedtime.     lisinopril (ZESTRIL) 10 MG tablet Take 10 mg by mouth daily.     magnesium oxide (MAG-OX) 400 MG tablet Take 400 mg by mouth daily.     metFORMIN (GLUCOPHAGE) 1000 MG tablet Take 1,000 mg by mouth 2 (two) times daily with a meal.     metoprolol  tartrate (LOPRESSOR ) 25 MG tablet Take 1 tablet (25 mg total) by  mouth 2 (two) times daily. 180 tablet 3   mirabegron ER (MYRBETRIQ) 25 MG TB24 tablet Take 25 mg by mouth daily.     nystatin (MYCOSTATIN/NYSTOP) powder Apply 1 Application topically See admin instructions. Apply topically to the top of right breast twice daily.   Also has a PRN order: Apply to the affected areas every eight hours as needed for redness/rash.     pantoprazole  (PROTONIX ) 40 MG tablet Take 40 mg by mouth daily.     Polyethyl Glycol-Propyl Glycol (SYSTANE) 0.4-0.3 % SOLN Place 2 drops into both eyes 2 (two) times daily.     pregabalin  (LYRICA ) 75 MG capsule Take 75 mg by mouth 2 (two) times daily.     rOPINIRole (REQUIP) 1 MG tablet Take 1 mg by mouth at bedtime.     traMADol (ULTRAM) 50 MG tablet Take 50 mg by mouth in the morning, at noon, and at bedtime.     traZODone (DESYREL) 150 MG tablet Take 150 mg by mouth at bedtime.     acetaminophen  (TYLENOL ) 325 MG tablet Take 650 mg by mouth every 6 (six) hours as needed for moderate pain. (Patient not taking: Reported on 09/09/2022)     albuterol  (VENTOLIN  HFA) 108 (90 Base) MCG/ACT inhaler Inhale 2 puffs into the lungs every 6 (six) hours as needed for wheezing or shortness of breath.  (Patient not taking: Reported on 09/09/2022)     benzonatate (TESSALON) 100 MG capsule Take 100 mg by mouth 3 (three) times daily as needed for cough. (Patient not taking: Reported on 09/09/2022)     diphenhydrAMINE  (BENADRYL ) 50 MG tablet Take 1 tablet 1 hour prior to CT scan (Patient not taking: Reported on 10/12/2023) 1 tablet 0   loperamide (IMODIUM) 2 MG capsule Take 2-4 mg by mouth 4 (four) times daily as needed for diarrhea or loose stools. (Patient not taking: Reported on 09/09/2022)     methocarbamol  (ROBAXIN ) 500 MG tablet Take 1 tablet (500 mg total) by mouth every 8 (eight) hours as needed for muscle spasms. (Patient not taking: Reported on 10/12/2023) 20 tablet 0   oxyCODONE -acetaminophen  (PERCOCET/ROXICET) 5-325 MG tablet Take 1 tablet by mouth  every 6 (six) hours as needed for severe pain. (Patient not taking: Reported on 09/09/2022) 8 tablet 0   predniSONE  (DELTASONE ) 50 MG tablet Take 1 tablet 13 hours prior to CT scan. Take 1 tablet 7 hours prior to CT scan. Take 1 tablet 1 hour prior to CT scan. (Patient not taking: Reported on 10/12/2023) 3 tablet 0   No current facility-administered medications for this visit.   Allergies:  Nsaids, Bee venom, Cephalosporins, Chocolate, Darvon [propoxyphene], Ivp dye [iodinated contrast media], Doxycycline, and Penicillins  Social History: The patient  reports that she quit smoking about 41 years ago. Her smoking use included cigarettes. She started smoking about 43 years ago. She has a 2 pack-year smoking history. She has never used smokeless tobacco. She reports that she does not drink alcohol and does not use drugs.   Family History: The patient's family history includes Coronary artery disease in her father; Heart attack in her father.   ROS:  Please see the history of present illness. Otherwise, complete review of systems is positive for none.  All other systems are reviewed and negative.   Physical Exam: VS:  BP 124/60 (BP Location: Left Arm, Patient Position: Sitting, Cuff Size: Large)   Pulse 64   Ht 5\' 3"  (1.6 m)   Wt 288 lb (130.6 kg)   SpO2 95%   BMI 51.02 kg/m , BMI Body mass index is 51.02 kg/m.  Wt Readings from Last 3 Encounters:  10/12/23 288 lb (130.6 kg)  05/10/23 241 lb (109.3 kg)  04/19/23 228 lb (103.4 kg)    General: Patient appears comfortable at rest. HEENT: Conjunctiva and lids normal, oropharynx clear with moist mucosa. Neck: Supple, no elevated JVP or carotid bruits, no thyromegaly. Lungs: Clear to auscultation, nonlabored breathing at rest. Cardiac: Regular rate and rhythm, no S3 or significant systolic murmur, no pericardial rub. Abdomen: Soft, nontender, no hepatomegaly, bowel sounds present, no guarding or rebound. Extremities: 1+ pitting edema in  bilateral lower extremities Skin: Warm and dry. Musculoskeletal: No kyphosis. Neuropsychiatric: Alert and oriented x3, affect grossly appropriate.  ECG: NSR  Recent Labwork: 10/26/2022: BUN 20; Creatinine, Ser 0.89; Potassium 4.7; Sodium 137  No results found for: "CHOL", "TRIG", "HDL", "CHOLHDL", "VLDL", "LDLCALC", "LDLDIRECT"   Assessment and Plan:  # Possibly cardiac chest pain - Continues to have chest heaviness with exertion and sometimes at rest.  She also gets chest heaviness when she gets upset.  She underwent LHC in 2012 for similar complaints that showed angiographically normal coronaries except for mild luminal irregularities.  -NM stress test from 4/24 was an intermediate risk study ((mild reversible perfusion defects in the apical anterior and apical anterolateral location). CTA cardiac was done in 5/24 to further evaluate, it showed coronary calcium score of 15.9, nondiagnostic study due to breathing artifact. Echo from 4/24 showed normal LVEF and no valvular heart disease. - Will start her on ranolazine  500 mg twice daily and reevaluate her chest pain symptoms.  If any improvement is noted, she will need to be scheduled for LHC.  Continue metoprolol  tartrate 25 mg twice daily. SL NTG PRN chest pain.  # Dizziness, exertional: Obtain 2-week event monitor, live  # History CVA with residual paralysis - Continue aspirin 81 mg once daily, atorvastatin 80 mg nightly.  # HLD - Continue to statin 80 mg nightly, goal LDL less than 70.  # HTN, controlled - Continue lisinopril 10 mg once daily.  # SOB likely secondary to deconditioning from morbid obesity rather than HFpEF - Has stable SOB.  P.o. Lasix 20 mg MWF  # OSA not on CPAP - Follow-up with PCP   Medication Adjustments/Labs and Tests Ordered: Current medicines are reviewed at length with the patient today.  Concerns regarding medicines are outlined above.    Disposition:  Follow up 3 months  Signed, Mate Alegria Beauford Bounds, MD, 10/12/2023 10:29 AM    [facility] [address], [place], [place] [address]

## 2023-10-12 NOTE — Patient Instructions (Signed)
 Medication Instructions:  Your physician has recommended you make the following change in your medication:   -Start nitroglycerin  0.4 mg SL tablets as needed for chest pain. The proper use and anticipated side effects of nitroglycerine has been carefully explained.  If a single episode of chest pain is not relieved by one tablet, the patient will try another within 5 minutes; and if this doesn't relieve the pain, the patient is instructed to call 911 for transportation to an emergency department.  -Start Ranexa  500 mg twice daily   *If you need a refill on your cardiac medications before your next appointment, please call your pharmacy*  Lab Work: None If you have labs (blood work) drawn today and your tests are completely normal, you will receive your results only by: MyChart Message (if you have MyChart) OR A paper copy in the mail If you have any lab test that is abnormal or we need to change your treatment, we will call you to review the results.  Testing/Procedures: Zio  Follow-Up: At [facility], you and your health needs are our priority.  As part of our continuing mission to provide you with exceptional heart care, our providers are all part of one team.  This team includes your primary Cardiologist (physician) and Advanced Practice Providers or APPs (Physician Assistants and Nurse Practitioners) who all work together to provide you with the care you need, when you need it.  Your next appointment:   3 month(s)  Provider:   You may see Vishnu P Mallipeddi, MD or one of the following Advanced Practice Providers on your designated Care Team:   [place], PA-C  Sc[place]ia [place], [place] Theotis Flake, [place]     We recommend signing up for the patient portal called "MyChart".  Sign up information is provided on this After Visit Summary.  MyChart is used to connect with patients for Virtual Visits (Telemedicine).  Patients are able to view lab/test results, encounter  notes, upcoming appointments, etc.  Non-urgent messages can be sent to your provider as well.   To learn more about what you can do with MyChart, go to [email or web address].   Other Instructions ZIO XT- Long Term Monitor Instructions   Your physician has requested you wear your ZIO patch monitor___14____days.   This is a single patch monitor.  [employer] supplies one patch monitor per enrollment.  Additional stickers are not available.   Please do not apply patch if you will be having a Nuclear Stress Test, Echocardiogram, Cardiac CT, MRI, or Chest Xray during the time frame you would be wearing the monitor. The patch cannot be worn during these tests.  You cannot remove and re-apply the ZIO XT patch monitor.   Your ZIO patch monitor will be sent USPS Priority mail from [facility] directly to your home address. The monitor may also be mailed to a PO BOX if home delivery is not available.   It may take 3-5 days to receive your monitor after you have been enrolled.   Once you have received you monitor, please review enclosed instructions.  Your monitor has already been registered assigning a specific monitor serial # to you.   Applying the monitor   Shave hair from upper left chest.   Hold abrader disc by orange tab.  Rub abrader in 40 strokes over left upper chest as indicated in your monitor instructions.   Clean area with 4 enclosed alcohol pads .  Use all pads to assure are is cleaned thoroughly.  Let  dry.   Apply patch as indicated in monitor instructions.  Patch will be place under collarbone on left side of chest with arrow pointing upward.   Rub patch adhesive wings for 2 minutes.Remove white label marked "1".  Remove white label marked "2".  Rub patch adhesive wings for 2 additional minutes.   While looking in a mirror, press and release button in center of patch.  A small green light will flash 3-4 times .  This will be your only indicator the monitor has been turned  on.     Do not shower for the first 24 hours.  You may shower after the first 24 hours.   Press button if you feel a symptom. You will hear a small click.  Record Date, Time and Symptom in the Patient Log Book.   When you are ready to remove patch, follow instructions on last 2 pages of Patient Log Book.  Stick patch monitor onto last page of Patient Log Book.   Place Patient Log Book in [place] box.  Use locking tab on box and tape box closed securely.  The [employer] has [employer] on it.  Please place in mailbox as soon as possible.  Your physician should have your test results approximately 7 days after the monitor has been mailed back to [facility].   Call [facility] Customer Care at [phone] if you have questions regarding your ZIO XT patch monitor.  Call them immediately if you see an orange light blinking on your monitor.   If your monitor falls off in less than 4 days contact our Monitor department at [phone].  If your monitor becomes loose or falls off after 4 days call [employer] at [phone] for suggestions on securing your monitor.

## 2023-11-04 ENCOUNTER — Other Ambulatory Visit: Payer: Self-pay

## 2023-11-04 ENCOUNTER — Emergency Department (HOSPITAL_COMMUNITY)

## 2023-11-04 ENCOUNTER — Observation Stay (HOSPITAL_COMMUNITY)
Admission: EM | Admit: 2023-11-04 | Discharge: 2023-11-07 | Disposition: A | Discharge status: Skilled Nursing Facility | Attending: Internal Medicine | Admitting: Internal Medicine

## 2023-11-04 ENCOUNTER — Encounter (HOSPITAL_COMMUNITY): Payer: Self-pay

## 2023-11-04 DIAGNOSIS — I10 Essential (primary) hypertension: Secondary | ICD-10-CM | POA: Diagnosis present

## 2023-11-04 DIAGNOSIS — R41841 Cognitive communication deficit: Secondary | ICD-10-CM | POA: Diagnosis not present

## 2023-11-04 DIAGNOSIS — I110 Hypertensive heart disease with heart failure: Secondary | ICD-10-CM | POA: Insufficient documentation

## 2023-11-04 DIAGNOSIS — E785 Hyperlipidemia, unspecified: Secondary | ICD-10-CM | POA: Diagnosis not present

## 2023-11-04 DIAGNOSIS — J449 Chronic obstructive pulmonary disease, unspecified: Secondary | ICD-10-CM | POA: Insufficient documentation

## 2023-11-04 DIAGNOSIS — J45909 Unspecified asthma, uncomplicated: Secondary | ICD-10-CM | POA: Diagnosis not present

## 2023-11-04 DIAGNOSIS — R29810 Facial weakness: Secondary | ICD-10-CM | POA: Diagnosis not present

## 2023-11-04 DIAGNOSIS — Z7902 Long term (current) use of antithrombotics/antiplatelets: Secondary | ICD-10-CM | POA: Diagnosis not present

## 2023-11-04 DIAGNOSIS — E1142 Type 2 diabetes mellitus with diabetic polyneuropathy: Secondary | ICD-10-CM | POA: Diagnosis not present

## 2023-11-04 DIAGNOSIS — Z794 Long term (current) use of insulin: Secondary | ICD-10-CM | POA: Diagnosis not present

## 2023-11-04 DIAGNOSIS — Z6841 Body Mass Index (BMI) 40.0 and over, adult: Secondary | ICD-10-CM | POA: Diagnosis not present

## 2023-11-04 DIAGNOSIS — G8928 Other chronic postprocedural pain: Secondary | ICD-10-CM | POA: Diagnosis not present

## 2023-11-04 DIAGNOSIS — Z87891 Personal history of nicotine dependence: Secondary | ICD-10-CM | POA: Diagnosis not present

## 2023-11-04 DIAGNOSIS — E782 Mixed hyperlipidemia: Secondary | ICD-10-CM | POA: Diagnosis not present

## 2023-11-04 DIAGNOSIS — Z79899 Other long term (current) drug therapy: Secondary | ICD-10-CM | POA: Insufficient documentation

## 2023-11-04 DIAGNOSIS — G8929 Other chronic pain: Secondary | ICD-10-CM | POA: Diagnosis present

## 2023-11-04 DIAGNOSIS — I639 Cerebral infarction, unspecified: Principal | ICD-10-CM

## 2023-11-04 DIAGNOSIS — I6789 Other cerebrovascular disease: Secondary | ICD-10-CM | POA: Insufficient documentation

## 2023-11-04 DIAGNOSIS — G629 Polyneuropathy, unspecified: Secondary | ICD-10-CM

## 2023-11-04 DIAGNOSIS — Z7984 Long term (current) use of oral hypoglycemic drugs: Secondary | ICD-10-CM | POA: Insufficient documentation

## 2023-11-04 DIAGNOSIS — I5030 Unspecified diastolic (congestive) heart failure: Secondary | ICD-10-CM | POA: Diagnosis not present

## 2023-11-04 DIAGNOSIS — Z8673 Personal history of transient ischemic attack (TIA), and cerebral infarction without residual deficits: Secondary | ICD-10-CM | POA: Diagnosis not present

## 2023-11-04 DIAGNOSIS — E119 Type 2 diabetes mellitus without complications: Secondary | ICD-10-CM

## 2023-11-04 LAB — CBC
HCT: 36.9 % (ref 36.0–46.0)
Hemoglobin: 11.5 g/dL — ABNORMAL LOW (ref 12.0–15.0)
MCH: 28.8 pg (ref 26.0–34.0)
MCHC: 31.2 g/dL (ref 30.0–36.0)
MCV: 92.5 fL (ref 80.0–100.0)
Platelets: 294 10*3/uL (ref 150–400)
RBC: 3.99 MIL/uL (ref 3.87–5.11)
RDW: 13.7 % (ref 11.5–15.5)
WBC: 10.4 10*3/uL (ref 4.0–10.5)
nRBC: 0 % (ref 0.0–0.2)

## 2023-11-04 LAB — RAPID URINE DRUG SCREEN, HOSP PERFORMED
Amphetamines: NOT DETECTED
Barbiturates: NOT DETECTED
Benzodiazepines: NOT DETECTED
Cocaine: NOT DETECTED
Opiates: NOT DETECTED
Tetrahydrocannabinol: NOT DETECTED

## 2023-11-04 LAB — DIFFERENTIAL
Abs Immature Granulocytes: 0.03 10*3/uL (ref 0.00–0.07)
Basophils Absolute: 0.1 10*3/uL (ref 0.0–0.1)
Basophils Relative: 1 %
Eosinophils Absolute: 0.6 10*3/uL — ABNORMAL HIGH (ref 0.0–0.5)
Eosinophils Relative: 6 %
Immature Granulocytes: 0 %
Lymphocytes Relative: 29 %
Lymphs Abs: 3 10*3/uL (ref 0.7–4.0)
Monocytes Absolute: 0.6 10*3/uL (ref 0.1–1.0)
Monocytes Relative: 6 %
Neutro Abs: 6.1 10*3/uL (ref 1.7–7.7)
Neutrophils Relative %: 58 %

## 2023-11-04 LAB — COMPREHENSIVE METABOLIC PANEL WITH GFR
ALT: 23 U/L (ref 0–44)
AST: 21 U/L (ref 15–41)
Albumin: 3.4 g/dL — ABNORMAL LOW (ref 3.5–5.0)
Alkaline Phosphatase: 53 U/L (ref 38–126)
Anion gap: 5 (ref 5–15)
BUN: 18 mg/dL (ref 8–23)
CO2: 27 mmol/L (ref 22–32)
Calcium: 9.2 mg/dL (ref 8.9–10.3)
Chloride: 104 mmol/L (ref 98–111)
Creatinine, Ser: 0.89 mg/dL (ref 0.44–1.00)
GFR, Estimated: >60 mL/min (ref >60)
Glucose, Bld: 82 mg/dL (ref 70–99)
Potassium: 4.3 mmol/L (ref 3.5–5.1)
Sodium: 136 mmol/L (ref 135–145)
Total Bilirubin: 0.6 mg/dL (ref 0.0–1.2)
Total Protein: 6.5 g/dL (ref 6.5–8.1)

## 2023-11-04 LAB — I-STAT CHEM 8, ED
BUN: 20 mg/dL (ref 8–23)
Calcium, Ion: 1.21 mmol/L (ref 1.15–1.40)
Chloride: 104 mmol/L (ref 98–111)
Creatinine, Ser: 1 mg/dL (ref 0.44–1.00)
Glucose, Bld: 80 mg/dL (ref 70–99)
HCT: 36 % (ref 36.0–46.0)
Hemoglobin: 12.2 g/dL (ref 12.0–15.0)
Potassium: 4.8 mmol/L (ref 3.5–5.1)
Sodium: 141 mmol/L (ref 135–145)
TCO2: 28 mmol/L (ref 22–32)

## 2023-11-04 LAB — CBG MONITORING, ED: Glucose-Capillary: 80 mg/dL (ref 70–99)

## 2023-11-04 LAB — ETHANOL: Alcohol, Ethyl (B): <15 mg/dL (ref <15)

## 2023-11-04 LAB — GLUCOSE, CAPILLARY: Glucose-Capillary: 98 mg/dL (ref 70–99)

## 2023-11-04 LAB — APTT: aPTT: 28 s (ref 24–36)

## 2023-11-04 LAB — PROTIME-INR
INR: 1 (ref 0.8–1.2)
Prothrombin Time: 13.4 s (ref 11.4–15.2)

## 2023-11-04 MED ORDER — MIRABEGRON ER 25 MG PO TB24
50.0000 mg | ORAL_TABLET | Freq: Every day | ORAL | Status: DC
  Administered 2023-11-05 – 2023-11-07 (×3): 50 mg via ORAL
  Filled 2023-11-04 (×3): qty 2

## 2023-11-04 MED ORDER — CLOPIDOGREL BISULFATE 75 MG PO TABS
75.0000 mg | ORAL_TABLET | Freq: Once | ORAL | Status: AC
  Administered 2023-11-04: 75 mg via ORAL
  Filled 2023-11-04: qty 1

## 2023-11-04 MED ORDER — FLUTICASONE FUROATE-VILANTEROL 200-25 MCG/ACT IN AEPB
1.0000 | INHALATION_SPRAY | Freq: Every day | RESPIRATORY_TRACT | Status: DC
  Administered 2023-11-05 – 2023-11-07 (×3): 1 via RESPIRATORY_TRACT
  Filled 2023-11-04: qty 28

## 2023-11-04 MED ORDER — LISINOPRIL 10 MG PO TABS
10.0000 mg | ORAL_TABLET | Freq: Every day | ORAL | Status: DC
  Filled 2023-11-04: qty 1

## 2023-11-04 MED ORDER — ENOXAPARIN SODIUM 40 MG/0.4ML IJ SOSY: 40.0000 mg | PREFILLED_SYRINGE | INTRAMUSCULAR | Status: DC

## 2023-11-04 MED ORDER — ACETAMINOPHEN 325 MG PO TABS
650.0000 mg | ORAL_TABLET | ORAL | Status: DC | PRN
  Administered 2023-11-04 – 2023-11-06 (×4): 650 mg via ORAL
  Filled 2023-11-04 (×4): qty 2

## 2023-11-04 MED ORDER — SODIUM CHLORIDE 0.9% FLUSH: 3.0000 mL | INTRAVENOUS | Status: DC | PRN

## 2023-11-04 MED ORDER — ASPIRIN 81 MG PO CHEW
81.0000 mg | CHEWABLE_TABLET | Freq: Every day | ORAL | Status: DC
  Administered 2023-11-05 – 2023-11-07 (×3): 81 mg via ORAL
  Filled 2023-11-04 (×3): qty 1

## 2023-11-04 MED ORDER — CLOPIDOGREL BISULFATE 75 MG PO TABS
75.0000 mg | ORAL_TABLET | Freq: Every day | ORAL | Status: DC
  Administered 2023-11-05 – 2023-11-07 (×3): 75 mg via ORAL
  Filled 2023-11-04 (×3): qty 1

## 2023-11-04 MED ORDER — STROKE: EARLY STAGES OF RECOVERY BOOK: Freq: Once | Status: AC

## 2023-11-04 MED ORDER — BUPRENORPHINE 5 MCG/HR TD PTWK: 1.0000 | MEDICATED_PATCH | TRANSDERMAL | Status: DC

## 2023-11-04 MED ORDER — ATORVASTATIN CALCIUM 40 MG PO TABS
80.0000 mg | ORAL_TABLET | Freq: Every day | ORAL | Status: DC
  Administered 2023-11-05 – 2023-11-07 (×3): 80 mg via ORAL
  Filled 2023-11-04 (×3): qty 2

## 2023-11-04 MED ORDER — ACETAMINOPHEN 650 MG RE SUPP: 650.0000 mg | RECTAL | Status: DC | PRN

## 2023-11-04 MED ORDER — INSULIN GLARGINE-YFGN 100 UNIT/ML ~~LOC~~ SOLN
50.0000 [IU] | Freq: Every day | SUBCUTANEOUS | Status: DC
  Administered 2023-11-04 – 2023-11-06 (×3): 50 [IU] via SUBCUTANEOUS
  Filled 2023-11-04 (×4): qty 0.5

## 2023-11-04 MED ORDER — SODIUM CHLORIDE 0.9% FLUSH
3.0000 mL | Freq: Two times a day (BID) | INTRAVENOUS | Status: DC
Start: 2023-11-04 — End: 2023-11-07
  Administered 2023-11-04: 10 mL via INTRAVENOUS
  Administered 2023-11-05: 3 mL via INTRAVENOUS
  Administered 2023-11-05: 10 mL via INTRAVENOUS
  Administered 2023-11-06: 5 mL via INTRAVENOUS
  Administered 2023-11-06: 10 mL via INTRAVENOUS
  Administered 2023-11-07: 3 mL via INTRAVENOUS

## 2023-11-04 MED ORDER — INSULIN ASPART 100 UNIT/ML IJ SOLN
0.0000 [IU] | Freq: Three times a day (TID) | INTRAMUSCULAR | Status: DC
Start: 2023-11-05 — End: 2023-11-07
  Administered 2023-11-06: 3 [IU] via SUBCUTANEOUS
  Administered 2023-11-06: 2 [IU] via SUBCUTANEOUS
  Administered 2023-11-06: 8 [IU] via SUBCUTANEOUS
  Administered 2023-11-07: 3 [IU] via SUBCUTANEOUS
  Administered 2023-11-07: 8 [IU] via SUBCUTANEOUS

## 2023-11-04 MED ORDER — ENOXAPARIN SODIUM 80 MG/0.8ML IJ SOSY
65.0000 mg | PREFILLED_SYRINGE | INTRAMUSCULAR | Status: DC
  Administered 2023-11-04 – 2023-11-06 (×3): 65 mg via SUBCUTANEOUS
  Filled 2023-11-04 (×3): qty 0.8

## 2023-11-04 MED ORDER — PANTOPRAZOLE SODIUM 20 MG PO TBEC
20.0000 mg | DELAYED_RELEASE_TABLET | Freq: Every day | ORAL | Status: DC
  Administered 2023-11-05 – 2023-11-06 (×2): 20 mg via ORAL
  Filled 2023-11-04 (×3): qty 1

## 2023-11-04 MED ORDER — ROPINIROLE HCL 1 MG PO TABS
1.0000 mg | ORAL_TABLET | Freq: Every day | ORAL | Status: DC
  Administered 2023-11-04 – 2023-11-06 (×3): 1 mg via ORAL
  Filled 2023-11-04 (×3): qty 1

## 2023-11-04 MED ORDER — SENNOSIDES-DOCUSATE SODIUM 8.6-50 MG PO TABS: 1.0000 | ORAL_TABLET | Freq: Every evening | ORAL | Status: DC | PRN

## 2023-11-04 MED ORDER — INSULIN ASPART 100 UNIT/ML IJ SOLN
0.0000 [IU] | Freq: Every day | INTRAMUSCULAR | Status: DC
  Administered 2023-11-06: 2 [IU] via SUBCUTANEOUS

## 2023-11-04 MED ORDER — ACETAMINOPHEN 160 MG/5ML PO SOLN: 650.0000 mg | ORAL | Status: DC | PRN

## 2023-11-04 NOTE — Consult Note (Signed)
 [facility] Tele[facility]rology Consult Services   Patient Name:   Valerie Wagner, Weaver Date of Birth:   1951-04-27 Identification Number:   MRN - [id number] Date of Service:   11/04/2023 16:21:44  Diagnosis:       I63.89 - Cerebrovascular accident (CVA) due to other mechanism [facility])  Impression:      1. acute on chronic Left arm/hand weakness  - CTH no acute findings  - symptoms have improved since it began, but she has not returned to normal  - hx stroke with residual mild L sided weakness  - presentation concerning for acute on chronic stroke vs recrudescence  Our recommendations are outlined below.  Recommendations:        Stroke/Telemetry Floor       Neuro Checks (Q4)       Bedside Swallow Eval       DVT Prophylaxis       IV Fluids, Normal Saline       Head of Bed 30 Degrees       Euglycemia and Avoid Hyperthermia (PRN Acetaminophen )       Bolus with Clopidogrel 300 mg bolus x1 and initiate dual antiplatelet therapy with Aspirin 81 mg daily and Clopidogrel 75 mg daily       Antihypertensives PRN if Blood pressure is greater than 220/120 or there is a concern for End organ damage/contraindications for permissive HTN. If blood pressure is greater than 220/120 give labetalol PO or IV or Vasotec IV with a goal of 15% reduction in BP during the first 24 hours.       routine MRI brain without contrast       echocardiogram       a1c, lipid panel       PT/OT consult       bradycardia work up per ED  Sign Out:       Discussed with Emergency Department Provider    ------------------------------------------------------------------------------  Advanced Imaging: Advanced Imaging Deferred because:  NIH 3, poor thrombectomy candidate at this time due to the nature of the symptoms and her baseline.   Metrics: Last Known Well: 11/04/2023 12:00:00 Dispatch Time: 11/04/2023 16:21:44 Arrival Time: 11/04/2023 16:13:00 Initial Response Time: 11/04/2023 16:23:48 Symptoms:  L weak. Initial patient interaction: 11/04/2023 16:29:25 NIHSS Assessment Completed: 11/04/2023 16:35:00 Patient is not a candidate for Thrombolytic. Thrombolytic Medical Decision: 11/04/2023 16:29:25 Patient was not deemed candidate for Thrombolytic because of following reasons: LKW outside 4.5 hr window. .  CT Head: Images were unavailable to me at the time of the exam. Radiologist was unavailable to review images. As per the radiologist report: no acute hemorrhage or core infarct. I personally reviewed all the CT images that were available to me and it showed: no acute hemorrhage or core infarct.  Primary Provider Notified of Diagnostic Impression and Management Plan on: 11/04/2023 16:54:55    ------------------------------------------------------------------------------  History of Present Illness: Patient is a 73 year old Female.  Patient was brought by EMS for symptoms of L weak. Valerie Wagner is a 73yo woman pmh stroke, CAD, HTN, CHF, DM2, COPD, meningoencephalitis, chronic pain. LNW noon arrived via EMS from SNF for Left sided weakness and slurred speech.  Hx of stroke with residual Left sided weakness. Says today around noon she was unable to move her Left arm and hand, associated with a headache. It has improved a little. Mild chronic Left sided weakness at baseline. Decreased LT in the LLE.     Past Medical History:      There is  no history of Atrial Fibrillation      There is no history of Seizures  Medications:  No Anticoagulant use  Antiplatelet use: Yes aspirin 81mg  Reviewed EMR for current medications  Allergies:  Reviewed  Social History: Smoking: Former Alcohol Use: No Drug Use: No  Family History:  There is no family history of premature cerebrovascular disease pertinent to this consultation  ROS : 14 Points Review of Systems was performed and was negative except mentioned in HPI.  Past Surgical History: There Is No Surgical History  Contributory To Today's Visit     Examination: BP(142/64), Pulse(50), 1A: Level of Consciousness - Alert; keenly responsive + 0 1B: Ask Month and Age - Both Questions Right + 0 1C: Blink Eyes & Squeeze Hands - Performs Both Tasks + 0 2: Test Horizontal Extraocular Movements - Normal + 0 3: Test Visual Fields - No Visual Loss + 0 4: Test Facial Palsy (Use Grimace if Obtunded) - Minor paralysis (flat nasolabial fold, smile asymmetry) + 1 5A: Test Left Arm Motor Drift - Drift, but doesn't hit bed + 1 5B: Test Right Arm Motor Drift - No Drift for 10 Seconds + 0 6A: Test Left Leg Motor Drift - No Drift for 5 Seconds + 0 6B: Test Right Leg Motor Drift - No Drift for 5 Seconds + 0 7: Test Limb Ataxia (FNF/Heel-Shin) - No Ataxia + 0 8: Test Sensation - Mild-Moderate Loss: Can Sense Being Touched + 1 9: Test Language/Aphasia - Normal; No aphasia + 0 10: Test Dysarthria - Normal + 0 11: Test Extinction/Inattention - No abnormality + 0  NIHSS Score: 3  NIHSS Free Text : L NLF  Pre-Morbid Modified Rankin Scale: 3 Points = Moderate disability; requiring some help, but able to walk without assistance  Spoke with : ER physician I reviewed the available imaging via Rapid and initiated discussion with the primary provider  This consult was conducted in real time using interactive audio and Immunologist. Patient was informed of the technology being used for this visit and agreed to proceed. Patient located in hospital and provider located at home/office setting.   Patient is being evaluated for possible acute neurologic impairment and high probability of imminent or life-threatening deterioration. I spent total of 30 minutes providing care to this patient, including time for face to face visit via telemedicine, review of medical records, imaging studies and discussion of findings with providers, the patient and/or family.   Dr Peder Bourdon   [facility] For Inpatient follow-up with  [facility] physician please call [facility] at [phone]. As we are not an outpatient service for any post hospital discharge needs please contact the hospital for assistance. If you have any questions for the [facility] physicians or need to reconsult for clinical or diagnostic changes please contact us  via [facility] at [phone].

## 2023-11-04 NOTE — H&P (Signed)
 History and Physical    Patient: Valerie Wagner ZOX:[id number] DOB: [birth year] DOA: 11/04/2023 DOS: the patient was seen and examined on 11/04/2023 PCP: Leesa Pulling, MD  Patient coming from: SNF  Chief Complaint:  Chief Complaint  Patient presents with   Code Stroke   HPI: Valerie Wagner is a 73 y.o. female with medical history significant of diabetes, hypertension, chronic pain, history of stroke, COPD, obesity.  Patient's prior stroke caused some residual left-sided weakness, particularly in the patient's foot.  She has had to walks with a walker for assistance.  She comes in today with some increased left-sided weakness with some possible left-sided facial droop.  Since her arrival, her symptoms have been improving, although she states that they are not back to her baseline.  There is no slurred speech.  No fevers, chills, nausea, vomiting.  Review of Systems: As mentioned in the history of present illness. All other systems reviewed and are negative. Past Medical History:  Diagnosis Date   Anxiety    Asthma    Chest pain    Reassuring workup of her time including negative cardiac catheterization and stress testing   COPD (chronic obstructive pulmonary disease) (HCC)    DDD (degenerative disc disease), lumbar    Depression    DJD (degenerative joint disease)    Essential hypertension, benign    Fibromyalgia    GERD (gastroesophageal reflux disease)    IBS (irritable bowel syndrome)    Mixed hyperlipidemia    Neuropathy    Obesity    Seizure (HCC)    Was on meds for 6 months and had no more seizures. No meds now.   Shortness of breath dyspnea    Stroke (HCC)    Type 2 diabetes mellitus (HCC)    Urinary incontinence    Past Surgical History:  Procedure Laterality Date   CARPAL TUNNEL RELEASE Bilateral    Right   CESAREAN SECTION     CHOLECYSTECTOMY     ESOPHAGEAL DILATION N/A 09/21/2021   Procedure: ESOPHAGEAL DILATION;  Surgeon: Urban Garden, MD;  [facility]:  [facility];  Service: Gastroenterology;  Laterality: N/A;   ESOPHAGOGASTRODUODENOSCOPY (EGD) WITH PROPOFOL  N/A 09/21/2021   Procedure: ESOPHAGOGASTRODUODENOSCOPY (EGD) WITH PROPOFOL ;  Surgeon: Urban Garden, MD;  Location: [facility];  Service: Gastroenterology;  Laterality: N/A;   FLEXIBLE SIGMOIDOSCOPY N/A 09/21/2021   Procedure: FLEXIBLE SIGMOIDOSCOPY;  Surgeon: Urban Garden, MD;  Location: [facility];  Service: Gastroenterology;  Laterality: N/A;  805 Patient is in [facility]   Hysterectomy -- unknown     Left Thumb Surgery Bilateral    RADIOLOGY WITH ANESTHESIA N/A 06/30/2015   Procedure: RADIOLOGY WITH ANESTHESIA;  Surgeon: Robbi Childs, MD;  Location: [facility];  Service: Radiology;  Laterality: N/A;   TONSILLECTOMY     Social History:  reports that she quit smoking about 41 years ago. Her smoking use included cigarettes. She started smoking about 43 years ago. She has a 2 pack-year smoking history. She has never used smokeless tobacco. She reports that she does not drink alcohol and does not use drugs.  Allergies  Allergen Reactions   Nsaids Shortness Of Breath   Bee Venom     unknown   Cephalosporins     unknown   Chocolate     unknown   Darvon [Propoxyphene]     unknown   Ivp Dye [Iodinated Contrast Media] Swelling   Doxycycline Rash   Penicillins Rash    Family History  Problem Relation Age of Onset   Coronary artery disease Father    Heart attack Father        Died age 37    Prior to Admission medications   Medication Sig Start Date End Date Taking? Authorizing Provider  ACCU-CHEK AVIVA PLUS test strip  02/05/20   [provider]  acetaminophen  (TYLENOL ) 325 MG tablet Take 650 mg by mouth every 6 (six) hours as needed for moderate pain. Patient not taking: Reported on 09/09/2022    [provider]  albuterol  (VENTOLIN  HFA) 108 (90 Base) MCG/ACT inhaler Inhale 2 puffs into the lungs every 6 (six) hours as needed for  wheezing or shortness of breath.  Patient not taking: Reported on 09/09/2022    [provider]  ANTI-DIARRHEAL 2 MG tablet Take 2 mg by mouth daily. 07/24/23   [provider]  aspirin 81 MG chewable tablet Chew 81 mg by mouth daily.    [provider]  atorvastatin (LIPITOR) 80 MG tablet Take 80 mg by mouth daily. 06/17/20   [provider]  atropine 1 % ophthalmic solution SMARTSIG:In Eye(s) 08/25/23   [provider]  benzonatate (TESSALON) 100 MG capsule Take 100 mg by mouth 3 (three) times daily as needed for cough. Patient not taking: Reported on 09/09/2022    [provider]  buprenorphine (BUTRANS) 5 MCG/HR PTWK Place 1 patch onto the skin once a week. 10/18/23   [provider]  busPIRone (BUSPAR) 5 MG tablet Take 5 mg by mouth 3 (three) times daily.    [provider]  cetirizine (ZYRTEC) 10 MG tablet Take 10 mg by mouth daily. 10/24/23   [provider]  Cranberry Juice Powder 425 MG CAPS Take 1 capsule by mouth daily. 08/29/23   [provider]  diphenhydrAMINE  (BENADRYL ) 50 MG tablet Take 1 tablet 1 hour prior to CT scan Patient not taking: Reported on 10/12/2023 10/26/22   Mallipeddi, Vishnu P, MD  dorzolamide-timolol (COSOPT) 2-0.5 % ophthalmic solution SMARTSIG:In Eye(s) 08/16/23   [provider]  Dulaglutide (TRULICITY) 0.75 MG/0.5ML SOPN Inject 0.75 mg into the skin once a week. Sundays.    [provider]  DULoxetine (CYMBALTA) 60 MG capsule Take 60 mg by mouth 2 (two) times daily. 06/17/20   [provider]  fluorometholone (FML) 0.1 % ophthalmic suspension SMARTSIG:In Eye(s) 08/16/23   [provider]  fluticasone furoate-vilanterol (BREO ELLIPTA) 200-25 MCG/ACT AEPB Inhale 1 puff into the lungs daily.    [provider]  furosemide (LASIX) 20 MG tablet Take 20 mg by mouth every Monday, Wednesday, and Friday.    [provider]  hydrOXYzine  (ATARAX) 25 MG tablet Take 25 mg by mouth at bedtime. 06/21/22   [provider]  Insulin  Glargine (BASAGLAR  KWIKPEN) 100 UNIT/ML Inject 50 Units into the skin at bedtime.    [provider]  ketorolac (ACULAR) 0.5 % ophthalmic solution SMARTSIG:In Eye(s) 07/27/23   [provider]  lisinopril (ZESTRIL) 10 MG tablet Take 10 mg by mouth daily.    [provider]  magnesium oxide (MAG-OX) 400 (240 Mg) MG tablet Take 1 tablet by mouth daily. 10/24/23   [provider]  metFORMIN (GLUCOPHAGE) 1000 MG tablet Take 1,000 mg by mouth 2 (two) times daily with a meal.    [provider]  methocarbamol  (ROBAXIN ) 500 MG tablet Take 1 tablet (500 mg total) by mouth every 8 (eight) hours as needed for muscle spasms. Patient not taking: Reported on 10/12/2023  07/09/22   Iva Mariner, MD  metoprolol  tartrate (LOPRESSOR ) 25 MG tablet Take 1 tablet (25 mg total) by mouth 2 (two) times daily. 11/01/22 10/12/23  Mallipeddi, Vishnu P, MD  MYRBETRIQ 50 MG TB24 tablet Take 50 mg by mouth daily. 10/24/23   [provider]  naloxone [facility]) nasal spray 4 mg/0.1 mL Place 1 spray into the nose once. 08/22/23   [provider]  nitroGLYCERIN  (NITROSTAT ) 0.4 MG SL tablet Place 1 tablet (0.4 mg total) under the tongue every 5 (five) minutes as needed for chest pain. 10/12/23 01/10/24  Mallipeddi, Vishnu P, MD  nystatin cream (MYCOSTATIN) Apply 1 Application topically 2 (two) times daily. 09/25/23   [provider]  ofloxacin (OCUFLOX) 0.3 % ophthalmic solution  07/28/23   [provider]  oxyCODONE -acetaminophen  (PERCOCET/ROXICET) 5-325 MG tablet Take 1 tablet by mouth every 6 (six) hours as needed for severe pain. Patient not taking: Reported on 09/09/2022 04/13/22   Prosperi, Christian H, PA-C  pantoprazole  (PROTONIX ) 20 MG tablet Take 20 mg by mouth daily. 10/24/23   [provider]  Polyethyl Glycol-Propyl Glycol (SYSTANE) 0.4-0.3 % SOLN Place 2  drops into both eyes 2 (two) times daily.    [provider]  prednisoLONE acetate (PRED FORTE) 1 % ophthalmic suspension SMARTSIG:In Eye(s) 10/04/23   [provider]  predniSONE  (DELTASONE ) 50 MG tablet Take 1 tablet 13 hours prior to CT scan. Take 1 tablet 7 hours prior to CT scan. Take 1 tablet 1 hour prior to CT scan. Patient not taking: Reported on 10/12/2023 10/26/22   Mallipeddi, Vishnu P, MD  pregabalin  (LYRICA ) 75 MG capsule Take 75 mg by mouth 2 (two) times daily.    [provider]  ranolazine  (RANEXA ) 500 MG 12 hr tablet Take 1 tablet (500 mg total) by mouth 2 (two) times daily. 10/12/23   Mallipeddi, Vishnu P, MD  rOPINIRole (REQUIP) 1 MG tablet Take 1 mg by mouth at bedtime.    [provider]  traMADol (ULTRAM) 50 MG tablet Take 50 mg by mouth in the morning, at noon, and at bedtime. 04/27/20   [provider]  traZODone (DESYREL) 150 MG tablet Take 150 mg by mouth at bedtime.    [provider]  Vitamin D, Ergocalciferol, (DRISDOL) 1.25 MG (50000 UNIT) CAPS capsule Take 50,000 Units by mouth once a week. 10/24/23   [provider]  ZTLIDO  1.8 % PTCH Apply 1 patch topically daily. 08/24/23   [provider]    Physical Exam: Vitals:   11/04/23 1700 11/04/23 1715 11/04/23 1730 11/04/23 1745  BP: 126/76 135/61 (!) 136/43 (!) 142/55  Pulse: (!) 53 (!) 51 (!) 51 (!) 53  Resp: 17 11 17 14   Temp:      TempSrc:      SpO2: 93% 91% 91% 92%  Weight:      Height:       General: Elderly female. Awake and alert and oriented x3. No acute cardiopulmonary distress.  HEENT: Normocephalic atraumatic.  Right and left ears normal in appearance.  Pupils equal, round, reactive to light. Extraocular muscles are intact. Sclerae anicteric and noninjected.  Moist mucosal membranes. No mucosal lesions.  Neck: Neck supple without lymphadenopathy. No carotid bruits. No masses palpated.  Cardiovascular: Regular rate with normal S1-S2 sounds.  No murmurs, rubs, gallops auscultated. No JVD.  Respiratory: Good respiratory effort with no wheezes, rales, rhonchi. Lungs clear to auscultation bilaterally.  No accessory muscle use. Abdomen: Soft, nontender, nondistended. Active bowel sounds.  No masses or hepatosplenomegaly  Skin: No rashes, lesions, or ulcerations.  Dry, warm to touch. 2+ dorsalis pedis and radial pulses. Musculoskeletal: No calf or leg pain. All major joints not erythematous nontender.  No upper or lower joint deformation.  Good ROM.  No contractures  Psychiatric: Intact judgment and insight. Pleasant and cooperative. Neurologic: Left lower extremity weakness: Patient appears to have dropfoot and very limited strength in that side.  No facial droop noted.  Data Reviewed: Results for orders placed or performed during the hospital encounter of 11/04/23 (from the past 24 hours)  Protime-INR     Status: None   Collection Time: 11/04/23  4:22 PM  Result Value Ref Range   Prothrombin Time 13.4 11.4 - 15.2 seconds   INR 1.0 0.8 - 1.2  APTT     Status: None   Collection Time: 11/04/23  4:22 PM  Result Value Ref Range   aPTT 28 24 - 36 seconds  CBC     Status: Abnormal   Collection Time: 11/04/23  4:22 PM  Result Value Ref Range   WBC 10.4 4.0 - 10.5 K/uL   RBC 3.99 3.87 - 5.11 MIL/uL   Hemoglobin 11.5 (L) 12.0 - 15.0 g/dL   HCT 62.1 30.8 - 65.7 %   MCV 92.5 80.0 - 100.0 fL   MCH 28.8 26.0 - 34.0 pg   MCHC 31.2 30.0 - 36.0 g/dL   RDW 84.6 96.2 - 95.2 %   Platelets 294 150 - 400 K/uL   nRBC 0.0 0.0 - 0.2 %  Comprehensive metabolic panel     Status: Abnormal   Collection Time: 11/04/23  4:22 PM  Result Value Ref Range   Sodium 136 135 - 145 mmol/L   Potassium 4.3 3.5 - 5.1 mmol/L   Chloride 104 98 - 111 mmol/L   CO2 27 22 - 32 mmol/L   Glucose, Bld 82 70 - 99 mg/dL   BUN 18 8 - 23 mg/dL   Creatinine, Ser 8.41 0.44 - 1.00 mg/dL   Calcium 9.2 8.9 - 32.4 mg/dL   Total Protein 6.5 6.5 - 8.1 g/dL   Albumin 3.4 (L)  3.5 - 5.0 g/dL   AST 21 15 - 41 U/L   ALT 23 0 - 44 U/L   Alkaline Phosphatase 53 38 - 126 U/L   Total Bilirubin 0.6 0.0 - 1.2 mg/dL   GFR, Estimated >40 >10 mL/min   Anion gap 5 5 - 15  CBG monitoring, ED     Status: None   Collection Time: 11/04/23  4:54 PM  Result Value Ref Range   Glucose-Capillary 80 70 - 99 mg/dL  I-stat chem 8, ED     Status: None   Collection Time: 11/04/23  4:57 PM  Result Value Ref Range   Sodium 141 135 - 145 mmol/L   Potassium 4.8 3.5 - 5.1 mmol/L   Chloride 104 98 - 111 mmol/L   BUN 20 8 - 23 mg/dL   Creatinine, Ser 2.72 0.44 - 1.00 mg/dL   Glucose, Bld 80 70 - 99 mg/dL   Calcium, Ion 5.36 6.44 - 1.40 mmol/L   TCO2 28 22 - 32 mmol/L   Hemoglobin 12.2 12.0 - 15.0 g/dL   HCT 03.4 74.2 - 59.5 %  Ethanol     Status: None   Collection Time: 11/04/23  5:00 PM  Result Value Ref Range   Alcohol, Ethyl (B) <15 <15 mg/dL  Differential     Status: Abnormal  Collection Time: 11/04/23  5:00 PM  Result Value Ref Range   Neutrophils Relative % 58 %   Neutro Abs 6.1 1.7 - 7.7 K/uL   Lymphocytes Relative 29 %   Lymphs Abs 3.0 0.7 - 4.0 K/uL   Monocytes Relative 6 %   Monocytes Absolute 0.6 0.1 - 1.0 K/uL   Eosinophils Relative 6 %   Eosinophils Absolute 0.6 (H) 0.0 - 0.5 K/uL   Basophils Relative 1 %   Basophils Absolute 0.1 0.0 - 0.1 K/uL   Immature Granulocytes 0 %   Abs Immature Granulocytes 0.03 0.00 - 0.07 K/uL  Urine rapid drug screen (hosp performed)     Status: None   Collection Time: 11/04/23  7:46 PM  Result Value Ref Range   Opiates NONE DETECTED NONE DETECTED   Cocaine NONE DETECTED NONE DETECTED   Benzodiazepines NONE DETECTED NONE DETECTED   Amphetamines NONE DETECTED NONE DETECTED   Tetrahydrocannabinol NONE DETECTED NONE DETECTED   Barbiturates NONE DETECTED NONE DETECTED    CT HEAD CODE STROKE WO CONTRAST Result Date: 11/04/2023 CLINICAL DATA:  Code stroke. Neuro deficit, acute, stroke suspected. EXAM: CT HEAD WITHOUT CONTRAST  TECHNIQUE: Contiguous axial images were obtained from the base of the skull through the vertex without intravenous contrast. RADIATION DOSE REDUCTION: This exam was performed according to the departmental dose-optimization program which includes automated exposure control, adjustment of the mA and/or kV according to patient size and/or use of iterative reconstruction technique. COMPARISON:  Head CT 02/02/2023. FINDINGS: Brain: Generalized cerebral atrophy. Patchy and ill-defined hypoattenuation within the cerebral white matter, nonspecific but compatible with mild chronic small vessel ischemic disease. There is no acute intracranial hemorrhage. No demarcated cortical infarct. No extra-axial fluid collection. No evidence of an intracranial mass. No midline shift. Vascular: No hyperdense vessel.  Atherosclerotic calcifications. Skull: No calvarial fracture. 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 Sinuses/Orbits: No orbital mass or acute orbital finding. No significant paranasal sinus disease at the imaged levels. 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 ASPECTS (Alberta Stroke Program Early CT Score) - Ganglionic level infarction (caudate, lentiform nuclei, internal capsule, insula, M1-M3 cortex): 7 - Supraganglionic infarction (M4-M6 cortex): 3 Total score (0-10 with 10 being normal): 10 No acute intracranial finding. These results were called by telephone at the time of interpretation on 11/04/2023 at 4:45 pm to provider [facility] , who verbally acknowledged these results. IMPRESSION: 1.  No acute intracranial finding. 2. Mild cerebral white matter chronic small vessel ischemic disease. 3. Mild cerebral atrophy. 4. 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 Electronically Signed   By: Bascom Lily D.O.   On: 11/04/2023 16:46     Assessment and Plan: No notes have been filed under this hospital  service. Service: Hospitalist  Principal Problem:   Facial droop Active Problems:   Essential hypertension, benign   Mixed hyperlipidemia   Chronic pain   Polyneuropathy   Severe obesity (BMI >= 40) (HCC)   Type 2 diabetes mellitus without complication, without long-term current use of insulin  (HCC)   (HFpEF) heart failure with preserved ejection fraction (HCC)   History of stroke  Facial droop with History of stroke with residual left sided deficits Observation on telemetry MRI head Carotid Dopplers  Echocardiogram tomorrow Hemoglobin A1c, lipid panel in the morning PT/OT/speech therapy consult Aspirin and plavix 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 Observation status is judged to be reasonable and necessary in order to provide the required intensity of service to ensure the patient's safety. The patient's presenting symptoms, physical exam findings, and initial radiographic and laboratory data in the context of their medical condition is felt to place them at decreased risk for further clinical deterioration. Furthermore, it is anticipated that the patient will be medically stable for discharge from the hospital within 2 midnights of admission.   Author: Ulices Maack J Margaruite Top, DO 11/04/2023 7:01 PM  For on call review [email or web address].

## 2023-11-04 NOTE — ED Provider Notes (Signed)
 [facility] EMERGENCY DEPARTMENT AT [facility] Provider Note   CSN: [id number] Arrival date & time: 11/04/23  1615  An emergency department physician performed an initial assessment on this suspected stroke patient at 1619.  History  Chief Complaint  Patient presents with   Code Stroke    Valerie Wagner is a 73 y.o. female.  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  For about the last hour it was reported that the patient had been calling out for help because she could not move her left side.  She was last seen normal at 12:00 noon today approximately 4 hours and 15 minutes prior to arrival.  She arrives by about 4:18 PM and I called a code stroke on arrival.  It was reported that the nurses at the facility found the patient to have left-sided deficits and some left-sided facial droop, they alerted paramedics who arrived immediately and transported the patient as a possible code stroke.  She does take an 81 mg aspirin but otherwise does not take any other anticoagulants.  She does have a history of hypertension and diabetes.  The patient denies headache chest pain or shortness of breath  Home Medications Prior to Admission medications   Medication Sig Start Date End Date Taking? Authorizing Provider  ACCU-CHEK AVIVA PLUS test strip  02/05/20   [provider]  acetaminophen  (TYLENOL ) 325 MG tablet Take 650 mg by mouth every 6 (six) hours as needed for moderate pain. Patient not taking: Reported on 09/09/2022    [provider]  albuterol  (VENTOLIN  HFA) 108 (90 Base) MCG/ACT inhaler Inhale 2 puffs into the lungs every 6 (six) hours as needed for wheezing or shortness of breath.  Patient not taking: Reported on 09/09/2022    [provider]  ANTI-DIARRHEAL 2 MG tablet Take 2 mg by mouth daily. 07/24/23   [provider]  aspirin 81 MG chewable tablet Chew 81 mg by mouth daily.    [provider]  atorvastatin (LIPITOR) 80 MG tablet Take 80 mg by mouth daily. 06/17/20   [provider]  atropine 1 % ophthalmic solution SMARTSIG:In Eye(s) 08/25/23   [provider]  benzonatate (TESSALON) 100 MG capsule Take 100 mg by mouth 3 (three) times daily as needed for cough. Patient not taking: Reported on 09/09/2022    [provider]  buprenorphine (BUTRANS) 5 MCG/HR PTWK Place 1 patch onto the skin once a week. 10/18/23   [provider]  busPIRone (BUSPAR) 5 MG tablet Take 5 mg by mouth 3 (three) times daily.    [provider]  cetirizine (ZYRTEC) 10 MG tablet Take 10 mg by mouth daily. 10/24/23   [provider]  Cranberry Juice Powder 425 MG CAPS Take 1 capsule by mouth daily. 08/29/23   [provider]  diphenhydrAMINE  (BENADRYL ) 50 MG tablet Take 1 tablet 1 hour prior to CT scan Patient not taking: Reported on 10/12/2023 10/26/22   Mallipeddi, Vishnu P, MD  dorzolamide-timolol (COSOPT) 2-0.5 % ophthalmic solution SMARTSIG:In Eye(s) 08/16/23   [provider]  Dulaglutide (TRULICITY) 0.75 MG/0.5ML SOPN Inject 0.75 mg into the skin once a week. Sundays.    [provider]  DULoxetine (CYMBALTA) 60 MG capsule Take 60 mg by mouth 2 (two) times daily. 06/17/20   [provider]  fluorometholone (FML)  0.1 % ophthalmic suspension SMARTSIG:In Eye(s) 08/16/23   [provider]  fluticasone furoate-vilanterol (BREO ELLIPTA) 200-25 MCG/ACT AEPB Inhale 1 puff into the lungs daily.    [provider]  furosemide (LASIX) 20 MG tablet Take 20 mg by mouth every Monday, Wednesday, and Friday.    [provider]  hydrOXYzine (ATARAX) 25 MG tablet Take 25 mg by mouth at bedtime. 06/21/22   [provider]  Insulin  Glargine (BASAGLAR  KWIKPEN) 100 UNIT/ML Inject 50 Units into the skin at bedtime.     [provider]  ketorolac (ACULAR) 0.5 % ophthalmic solution SMARTSIG:In Eye(s) 07/27/23   [provider]  lisinopril (ZESTRIL) 10 MG tablet Take 10 mg by mouth daily.    [provider]  magnesium oxide (MAG-OX) 400 (240 Mg) MG tablet Take 1 tablet by mouth daily. 10/24/23   [provider]  metFORMIN (GLUCOPHAGE) 1000 MG tablet Take 1,000 mg by mouth 2 (two) times daily with a meal.    [provider]  methocarbamol  (ROBAXIN ) 500 MG tablet Take 1 tablet (500 mg total) by mouth every 8 (eight) hours as needed for muscle spasms. Patient not taking: Reported on 10/12/2023 07/09/22   Iva Mariner, MD  metoprolol  tartrate (LOPRESSOR ) 25 MG tablet Take 1 tablet (25 mg total) by mouth 2 (two) times daily. 11/01/22 10/12/23  Mallipeddi, Vishnu P, MD  MYRBETRIQ 50 MG TB24 tablet Take 50 mg by mouth daily. 10/24/23   [provider]  naloxone [facility]) nasal spray 4 mg/0.1 mL Place 1 spray into the nose once. 08/22/23   [provider]  nitroGLYCERIN  (NITROSTAT ) 0.4 MG SL tablet Place 1 tablet (0.4 mg total) under the tongue every 5 (five) minutes as needed for chest pain. 10/12/23 01/10/24  Mallipeddi, Vishnu P, MD  nystatin cream (MYCOSTATIN) Apply 1 Application topically 2 (two) times daily. 09/25/23   [provider]  ofloxacin (OCUFLOX) 0.3 % ophthalmic solution  07/28/23   [provider]  oxyCODONE -acetaminophen  (PERCOCET/ROXICET) 5-325 MG tablet Take 1 tablet by mouth every 6 (six) hours as needed for severe pain. Patient not taking: Reported on 09/09/2022 04/13/22   Prosperi, Christian H, PA-C  pantoprazole  (PROTONIX ) 20 MG tablet Take 20 mg by mouth daily. 10/24/23   [provider]  Polyethyl Glycol-Propyl Glycol (SYSTANE) 0.4-0.3 % SOLN Place 2 drops into both eyes 2 (two) times daily.    [provider]  prednisoLONE acetate (PRED FORTE) 1 % ophthalmic suspension SMARTSIG:In Eye(s) 10/04/23   [provider]   predniSONE  (DELTASONE ) 50 MG tablet Take 1 tablet 13 hours prior to CT scan. Take 1 tablet 7 hours prior to CT scan. Take 1 tablet 1 hour prior to CT scan. Patient not taking: Reported on 10/12/2023 10/26/22   Mallipeddi, Vishnu P, MD  pregabalin  (LYRICA ) 75 MG capsule Take 75 mg by mouth 2 (two) times daily.    [provider]  ranolazine  (RANEXA ) 500 MG 12 hr tablet Take 1 tablet (500 mg total) by mouth 2 (two) times daily. 10/12/23   Mallipeddi, Vishnu P, MD  rOPINIRole (REQUIP) 1 MG tablet Take 1 mg by mouth at bedtime.    [provider]  traMADol (ULTRAM) 50 MG tablet Take 50 mg by mouth in the morning, at noon, and at bedtime. 04/27/20   [provider]  traZODone (DESYREL) 150 MG tablet Take 150 mg by mouth at bedtime.    [provider]  Vitamin D, Ergocalciferol, (DRISDOL) 1.25 MG (50000 UNIT) CAPS capsule Take  50,000 Units by mouth once a week. 10/24/23   [provider]  ZTLIDO  1.8 % PTCH Apply 1 patch topically daily. 08/24/23   [provider]      Allergies    Nsaids, Bee venom, Cephalosporins, Chocolate, Darvon [propoxyphene], Ivp dye [iodinated contrast media], Doxycycline, and Penicillins    Review of Systems   Review of Systems  All other systems reviewed and are negative.   Physical Exam Updated Vital Signs BP (!) 142/64 (BP Location: Right Arm)   Pulse (!) 50   Temp 98.2 F (36.8 C) (Oral)   Resp 14   Ht 1.676 m (5\' 6" )   Wt 134.9 kg   SpO2 93%   BMI 47.99 kg/m  Physical Exam Vitals and nursing note reviewed.  Constitutional:      General: She is not in acute distress.    Appearance: She is well-developed.  HENT:     Head: Normocephalic and atraumatic.     Mouth/Throat:     Pharynx: No oropharyngeal exudate.  Eyes:     General: No scleral icterus.       Right eye: No discharge.        Left eye: No discharge.     Conjunctiva/sclera: Conjunctivae normal.     Pupils: Pupils are equal, round, and reactive to  light.  Neck:     Thyroid : No thyromegaly.     Vascular: No JVD.  Cardiovascular:     Rate and Rhythm: Normal rate and regular rhythm.     Heart sounds: Normal heart sounds. No murmur heard.    No friction rub. No gallop.  Pulmonary:     Effort: Pulmonary effort is normal. No respiratory distress.     Breath sounds: Normal breath sounds. No wheezing or rales.  Abdominal:     General: Bowel sounds are normal. There is no distension.     Palpations: Abdomen is soft. There is no mass.     Tenderness: There is no abdominal tenderness.  Musculoskeletal:        General: No tenderness. Normal range of motion.     Cervical back: Normal range of motion and neck supple.  Lymphadenopathy:     Cervical: No cervical adenopathy.  Skin:    General: Skin is warm and dry.     Findings: No erythema or rash.  Neurological:     Mental Status: She is alert.     Coordination: Coordination normal.     Comments: Left hand weakness, cannot grip with her left hand, she has weakness of the left arm compared to the right, left leg is weak compared to the right, there is decrease sensation of the left leg left arm and left face.  There is subtle left facial droop, speech is normal, vision is at baseline.  Psychiatric:        Behavior: Behavior normal.     ED Results / Procedures / Treatments   Labs (all labs ordered are listed, but only abnormal results are displayed) Labs Reviewed  CBC - Abnormal; Notable for the following components:      Result Value   Hemoglobin 11.5 (*)    All other components within normal limits  COMPREHENSIVE METABOLIC PANEL WITH GFR - Abnormal; Notable for the following components:   Albumin 3.4 (*)    All other components within normal limits  ETHANOL  PROTIME-INR  APTT  DIFFERENTIAL  RAPID URINE DRUG SCREEN, HOSP PERFORMED  I-STAT CHEM 8, ED  CBG MONITORING, ED  EKG EKG Interpretation Date/Time:  Saturday Nov 04 2023 16:47:39 EDT Ventricular Rate:  100 PR  Interval:    QRS Duration:  95 QT Interval:  425 QTC Calculation: 570 R Axis:   16  Text Interpretation: Sinus bradycardia , rate of 50 Low voltage, precordial leads Prolonged QT interval since last tracing no significant change Confirmed by Early Glisson ([id number]) on 11/04/2023 5:29:33 PM  Radiology CT HEAD CODE STROKE WO CONTRAST Result Date: 11/04/2023 CLINICAL DATA:  Code stroke. Neuro deficit, acute, stroke suspected. EXAM: CT HEAD WITHOUT CONTRAST TECHNIQUE: Contiguous axial images were obtained from the base of the skull through the vertex without intravenous contrast. RADIATION DOSE REDUCTION: This exam was performed according to the departmental dose-optimization program which includes automated exposure control, adjustment of the mA and/or kV according to patient size and/or use of iterative reconstruction technique. COMPARISON:  Head CT 02/02/2023. FINDINGS: Brain: Generalized cerebral atrophy. Patchy and ill-defined hypoattenuation within the cerebral white matter, nonspecific but compatible with mild chronic small vessel ischemic disease. There is no acute intracranial hemorrhage. No demarcated cortical infarct. No extra-axial fluid collection. No evidence of an intracranial mass. No midline shift. Vascular: No hyperdense vessel.  Atherosclerotic calcifications. Skull: No calvarial fracture. 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 Sinuses/Orbits: No orbital mass or acute orbital finding. No significant paranasal sinus disease at the imaged levels. 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 ASPECTS ([place]berta Stroke Program Early CT Score) - Ganglionic level infarction (caudate, lentiform nuclei, internal capsule, insula, M1-M3 cortex): 7 - Supraganglionic infarction (M4-M6 cortex): 3 Total score (0-10 with 10 being normal): 10 No acute intracranial finding. These results were called by telephone at the time of interpretation on  11/04/2023 at 4:45 pm to provider [facility] , who verbally acknowledged these results. IMPRESSION: 1.  No acute intracranial finding. 2. Mild cerebral white matter chronic small vessel ischemic disease. 3. Mild cerebral atrophy. 4. 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 Electronically Signed   By: Bascom Lily D.O.   On: 11/04/2023 16:46    Procedures Procedures    Medications Ordered in ED Medications  clopidogrel (PLAVIX) tablet 75 mg (has no administration in time range)    ED Course/ Medical Decision Making/ A&P Clinical Course as of 11/04/23 1744  Sat Nov 04, 2023  1657 Discussed the case with the neurologist who recommends inpatient admission for stroke workup including MRI and Plavix load [BM]    Clinical Course User Index [BM] Early Glisson, MD                                 Medical Decision Making Amount and/or Complexity of Data Reviewed Labs: ordered. Radiology: ordered.  Risk Prescription drug management. Decision regarding hospitalization.   - This patient presents to the ED for concern of possible stroke, this involves an extensive number of treatment options, and is a complaint that carries with it a high risk of complications and morbidity.  The differential diagnosis includes throat, hypoglycemia, infection, hemorrhage, aneurysm, tumor, seizure Code stroke activated at about 4:20 PM   Co morbidities that complicate the patient evaluation  Diabetes, hypertension, prior stroke   Additional history obtained:  Additional history obtained from paramedics as well as the electronic medical record External records from outside source obtained and reviewed including prior medical record which shows that the patient had an echocardiogram about  1 year ago, this showed an ejection fraction of 60 to 65% CT coronary angiogram showed multichamber enlargement, arterial hypertension   Lab Tests:  I Ordered, and personally  interpreted labs.  The pertinent results include: Metabolic panel and CBC are both rather unremarkable   Imaging Studies ordered:  I ordered imaging studies including CT scan of the brain without contrast I independently visualized and interpreted imaging which showed no acute findings in the brain, chronic small vessel disease, parotid mass visualized incompletely I agree with the radiologist interpretation   Cardiac Monitoring: / EKG:  The patient was maintained on a cardiac monitor.  I personally viewed and interpreted the cardiac monitored which showed an underlying rhythm of: Sinus bradycardia   Problem List / ED Course / Critical interventions / Medication management  I discussed the case with the neurologist on-call, they want the patient be admitted to the hospital.  They recommend Plavix I ordered medication including Plavix for stroke Reevaluation of the patient after these medicines showed that the patient patient is gradually improving I have reviewed the patients home medicines and have made adjustments as needed   Consultations Obtained:  I requested consultation with the hospitalist Dr. Cathyann Cobia,  and discussed lab and imaging findings as well as pertinent plan - they recommend: Admission   Social Determinants of Health:  Nursing home patient   Test / Admission - Considered:  Admit         Final Clinical Impression(s) / ED Diagnoses Final diagnoses:  Acute ischemic stroke [facility])    Rx / DC Orders ED Discharge Orders     None         Early Glisson, MD 11/04/23 1745

## 2023-11-04 NOTE — ED Triage Notes (Signed)
 Pt BIB ems for CODE STROKE. Pt experience left arm and leg weakness starting at noon today. Pt states dizziness and headache starting around noon as well.

## 2023-11-04 NOTE — Progress Notes (Addendum)
 [phone] elert for ems patient coming in door 1615 registered and EDP assessing, page sent to [facility] doc [place] 1616 page sent to Dr. Janett Medin, request for page to ts per Dr. Lieutenant Reese 1621 page sent to ts 1622 down to ct  LKW 1200, uses walker. Left sided weakness, has deficits on left side from prior stroke  1629 Dr. Aisha Ali on screen 1632 patient back in room  No tnk. 1651 ncct relayed to [facility]

## 2023-11-04 NOTE — Progress Notes (Signed)
 PHARMACIST - PHYSICIAN COMMUNICATION  CONCERNING:  Enoxaparin  (Lovenox ) for DVT Prophylaxis    RECOMMENDATION: Patient was prescribed enoxaprin 40mg  q24 hours for VTE prophylaxis.   Filed Weights   11/04/23 1641  Weight: 134.9 kg (297 lb 4.8 oz)    Body mass index is 47.99 kg/m.  Estimated Creatinine Clearance: 74 mL/min (by C-G formula based on SCr of 1 mg/dL).   Based on [facility] policy patient is candidate for enoxaparin  0.5mg /kg TBW SQ every 24 hours based on BMI being >30.  DESCRIPTION: Pharmacy has adjusted enoxaparin  dose per [facility] policy.  Patient is now receiving enoxaparin  65 mg every 24 hours    Alice Innocent, PharmD Clinical Pharmacist  11/04/2023 7:16 PM

## 2023-11-05 ENCOUNTER — Observation Stay (HOSPITAL_COMMUNITY)

## 2023-11-05 DIAGNOSIS — E782 Mixed hyperlipidemia: Secondary | ICD-10-CM | POA: Diagnosis not present

## 2023-11-05 DIAGNOSIS — G459 Transient cerebral ischemic attack, unspecified: Secondary | ICD-10-CM

## 2023-11-05 DIAGNOSIS — R29810 Facial weakness: Principal | ICD-10-CM

## 2023-11-05 DIAGNOSIS — J449 Chronic obstructive pulmonary disease, unspecified: Secondary | ICD-10-CM | POA: Diagnosis not present

## 2023-11-05 DIAGNOSIS — Z8673 Personal history of transient ischemic attack (TIA), and cerebral infarction without residual deficits: Secondary | ICD-10-CM | POA: Diagnosis not present

## 2023-11-05 LAB — ECHOCARDIOGRAM COMPLETE
AR max vel: 2.23 cm2
AV Peak grad: 11 mmHg
Ao pk vel: 1.66 m/s
Area-P 1/2: 2.56 cm2
Height: 66 in
MV VTI: 2.44 cm2
S' Lateral: 3.1 cm
Weight: 4756.8 [oz_av]

## 2023-11-05 LAB — LIPID PANEL
Cholesterol: 119 mg/dL (ref 0–200)
HDL: 34 mg/dL — ABNORMAL LOW (ref >40)
LDL Cholesterol: 64 mg/dL (ref 0–99)
Total CHOL/HDL Ratio: 3.5 ratio
Triglycerides: 103 mg/dL (ref <150)
VLDL: 21 mg/dL (ref 0–40)

## 2023-11-05 LAB — GLUCOSE, CAPILLARY
Glucose-Capillary: 73 mg/dL (ref 70–99)
Glucose-Capillary: 97 mg/dL (ref 70–99)
Glucose-Capillary: 192 mg/dL — ABNORMAL HIGH (ref 70–99)

## 2023-11-05 LAB — HIV ANTIBODY (ROUTINE TESTING W REFLEX): HIV Screen 4th Generation wRfx: NONREACTIVE

## 2023-11-05 LAB — HEMOGLOBIN A1C
Hgb A1c MFr Bld: 6.7 % — ABNORMAL HIGH (ref 4.8–5.6)
Mean Plasma Glucose: 145.59 mg/dL

## 2023-11-05 MED ORDER — PERFLUTREN LIPID MICROSPHERE
1.0000 mL | INTRAVENOUS | Status: AC | PRN
Start: 2023-11-05 — End: 2023-11-05
  Administered 2023-11-05: 2 mL via INTRAVENOUS

## 2023-11-05 NOTE — TOC CM/SW Note (Addendum)
 CM called [facility], spoke with [facility]. Valerie Wagner reported patient cannot come back today because no one to admitted her  to the facility.

## 2023-11-05 NOTE — Progress Notes (Addendum)
 PROGRESS NOTE    Valerie Wagner  ZOX:[id number] DOB: [birth year] DOA: 11/04/2023 PCP: Leesa Pulling, MD   Brief Narrative:    Valerie Wagner is a 73 y.o. female with medical history significant of diabetes, hypertension, chronic pain, history of stroke, COPD, obesity.  Patient's prior stroke caused some residual left-sided weakness, particularly in the patient's foot.  She has had to walks with a walker for assistance.  She comes in today with some increased left-sided weakness with some possible left-sided facial droop.  She has noted to have some bradycardia as well.  Assessment & Plan:   Principal Problem:   Facial droop Active Problems:   Essential hypertension, benign   Mixed hyperlipidemia   Chronic pain   Polyneuropathy   Severe obesity (BMI >= 40) (HCC)   Type 2 diabetes mellitus without complication, without long-term current use of insulin  (HCC)   (HFpEF) heart failure with preserved ejection fraction (HCC)   History of stroke  Assessment and Plan:   Facial droop resolved and likely related to TIA Observation on telemetry MRI head negative for any acute findings Carotid Dopplers without significant findings Echocardiogram performed with results pending Hemoglobin A1c 6.7%, lipid panel with LDL 64 PT/OT/speech therapy consult Aspirin and plavix Type 2 diabetes with polyneuropathy and chronic pain Hold metformin Continue home insulin  regimen with sliding scale insulin  Heart failure with preserved EF Continue Lasix Hypertension Continue antihypertensives Hyperlipidemia Bradycardia Further investigation with repeat EKG and 2D echocardiogram Morbid obesity BMI 47.99   DVT prophylaxis: Lovenox  Code Status: Full Family Communication: None at bedside Disposition Plan:  Status is: Observation The patient remains OBS appropriate and will d/c before 2 midnights.  Consultants:  Neurology  Procedures:  None  Antimicrobials:  None   Subjective: Patient seen  and evaluated today with no new acute complaints or concerns. No acute concerns or events noted overnight.  Her facial droop has resolved.  She denies any new complaints or concerns.  Objective: Vitals:   11/04/23 2219 11/05/23 0300 11/05/23 0628 11/05/23 0939  BP: (!) 124/55 (!) 166/66 (!) 159/67   Pulse: (!) 59 (!) 52    Resp: 18 19 17    Temp: 98.9 F (37.2 C) 97.6 F (36.4 C) 97.7 F (36.5 C)   TempSrc: Oral Oral Oral   SpO2: 95%  97% 96%  Weight:      Height:        Intake/Output Summary (Last 24 hours) at 11/05/2023 1433 Last data filed at 11/05/2023 1426 Gross per 24 hour  Intake 720 ml  Output 900 ml  Net -180 ml   Filed Weights   11/04/23 1641  Weight: 134.9 kg    Examination:  General exam: Appears calm and comfortable, morbidly obese Respiratory system: Clear to auscultation. Respiratory effort normal. Cardiovascular system: S1 & S2 heard, RRR.  Gastrointestinal system: Abdomen is soft Central nervous system: Alert and awake Extremities: No edema Skin: No significant lesions noted Psychiatry: Flat affect.    Data Reviewed: I have personally reviewed following labs and imaging studies  CBC: Recent Labs  Lab 11/04/23 1622 11/04/23 1657 11/04/23 1700  WBC 10.4  --   --   NEUTROABS  --   --  6.1  HGB 11.5* 12.2  --   HCT 36.9 36.0  --   MCV 92.5  --   --   PLT 294  --   --    Basic Metabolic Panel: Recent Labs  Lab 11/04/23 1622 11/04/23 1657  NA 136 141  K  4.3 4.8  CL 104 104  CO2 27  --   GLUCOSE 82 80  BUN 18 20  CREATININE 0.89 1.00  CALCIUM 9.2  --    GFR: Estimated Creatinine Clearance: 74 mL/min (by C-G formula based on SCr of 1 mg/dL). Liver Function Tests: Recent Labs  Lab 11/04/23 1622  AST 21  ALT 23  ALKPHOS 53  BILITOT 0.6  PROT 6.5  ALBUMIN 3.4*   No results for input(s): "LIPASE", "AMYLASE" in the last 168 hours. No results for input(s): "AMMONIA" in the last 168 hours. Coagulation Profile: Recent Labs  Lab  11/04/23 1622  INR 1.0   Cardiac Enzymes: No results for input(s): "CKTOTAL", "CKMB", "CKMBINDEX", "TROPONINI" in the last 168 hours. BNP (last 3 results) No results for input(s): "PROBNP" in the last 8760 hours. HbA1C: Recent Labs    11/05/23 0449  HGBA1C 6.7*   CBG: Recent Labs  Lab 11/04/23 1654 11/04/23 2114 11/05/23 0741 11/05/23 1132  GLUCAP 80 98 73 97   Lipid Profile: Recent Labs    11/05/23 0449  CHOL 119  HDL 34*  LDLCALC 64  TRIG 161  CHOLHDL 3.5   Thyroid  Function Tests: No results for input(s): "TSH", "T4TOTAL", "FREET4", "T3FREE", "THYROIDAB" in the last 72 hours. Anemia Panel: No results for input(s): "VITAMINB12", "FOLATE", "FERRITIN", "TIBC", "IRON", "RETICCTPCT" in the last 72 hours. Sepsis Labs: No results for input(s): "PROCALCITON", "LATICACIDVEN" in the last 168 hours.  No results found for this or any previous visit (from the past 240 hours).       Radiology Studies: US  Carotid Bilateral (at [facility] and [facility] only) Result Date: 11/05/2023 CLINICAL DATA:  TIA, negative head MR EXAM: BILATERAL CAROTID DUPLEX ULTRASOUND TECHNIQUE: Martina Sledge scale imaging, color Doppler and duplex ultrasound were performed of bilateral carotid and vertebral arteries in the neck. COMPARISON:  03/01/2016 by report only FINDINGS: Criteria: Quantification of carotid stenosis is based on velocity parameters that correlate the residual internal carotid diameter with NASCET-based stenosis levels, using the diameter of the distal internal carotid lumen as the denominator for stenosis measurement. The following velocity measurements were obtained: RIGHT ICA: 80/22 cm/sec CCA: 60/11 cm/sec SYSTOLIC ICA/CCA RATIO:  1.3 ECA: 118 cm/sec LEFT ICA: 87/15 cm/sec CCA: 75/12 cm/sec SYSTOLIC ICA/CCA RATIO:  1.1 ECA: 103 cm/sec RIGHT CAROTID ARTERY: Mild tortuosity. Smooth intimal thickening in the bulb without high-grade stenosis. Normal waveforms and color Doppler signal throughout. RIGHT  VERTEBRAL ARTERY:  Normal flow direction and waveform. LEFT CAROTID ARTERY: Intimal thickening in the common carotid artery and bulb. No significant plaque accumulation or stenosis. Normal waveforms and color Doppler signal. Distal ICA tortuous. LEFT VERTEBRAL ARTERY:  Normal flow direction and waveform. Technologist describes technically difficult study secondary to patient breathing pattern, snoring, body habitus. IMPRESSION: 1. Bilateral carotid intimal thickening without significant plaque or stenosis. 2. Antegrade bilateral vertebral arterial flow. Electronically Signed   By: Nicoletta Barrier M.D.   On: 11/05/2023 11:45   MR BRAIN WO CONTRAST Result Date: 11/05/2023 CLINICAL DATA:  73 year old female code stroke presentation yesterday. EXAM: MRI HEAD WITHOUT CONTRAST TECHNIQUE: Multiplanar, multiecho pulse sequences of the brain and surrounding structures were obtained without intravenous contrast. COMPARISON:  Head CT yesterday.  Brain MRI 06/27/2015. Cervical spine CT 12/08/2009. FINDINGS: Brain: No restricted diffusion to suggest acute infarction. No midline shift, mass effect, evidence of mass lesion, ventriculomegaly, extra-axial collection or acute intracranial hemorrhage. Cervicomedullary junction and pituitary are within normal limits. Mild motion degradation. Martina Sledge and white matter signal is within normal limits  for age, minimal scattered nonspecific subcortical white matter T2 and FLAIR hyperintensity. No cortical encephalomalacia or chronic cerebral blood products identified. Deep gray nuclei, brainstem and cerebellum appear negative. Vascular: Major intracranial vascular flow voids not as well evaluated due to motion artifact on axial and coronal T2 imaging, but grossly maintained. Skull and upper cervical spine: Negative for age visible cervical spine. Visualized bone marrow signal is within normal limits. Sinuses/Orbits: Postoperative changes to the right globe since 2017, otherwise negative. Other:  Mild chronic mastoid effusions are stable. Left parotid space rounded, roughly 2.3 cm T2 heterogeneous T1 hypointense soft tissue nodule with diffusion signal (series 4, image 20, series 10 image 18) was visible on the axial CT images in 2011, about 1.2 cm at that time. IMPRESSION: 1. No acute intracranial abnormality. 2. Benign left parotid space salivary neoplasm, present since 2011. Electronically Signed   By: Marlise Simpers M.D.   On: 11/05/2023 09:03   CT HEAD CODE STROKE WO CONTRAST Result Date: 11/04/2023 CLINICAL DATA:  Code stroke. Neuro deficit, acute, stroke suspected. EXAM: CT HEAD WITHOUT CONTRAST TECHNIQUE: Contiguous axial images were obtained from the base of the skull through the vertex without intravenous contrast. RADIATION DOSE REDUCTION: This exam was performed according to the departmental dose-optimization program which includes automated exposure control, adjustment of the mA and/or kV according to patient size and/or use of iterative reconstruction technique. COMPARISON:  Head CT 02/02/2023. FINDINGS: Brain: Generalized cerebral atrophy. Patchy and ill-defined hypoattenuation within the cerebral white matter, nonspecific but compatible with mild chronic small vessel ischemic disease. There is no acute intracranial hemorrhage. No demarcated cortical infarct. No extra-axial fluid collection. No evidence of an intracranial mass. No midline shift. Vascular: No hyperdense vessel.  Atherosclerotic calcifications. Skull: No calvarial fracture. 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 Sinuses/Orbits: No orbital mass or acute orbital finding. No significant paranasal sinus disease at the imaged levels. 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 ASPECTS (Alberta Stroke Program Early CT Score) - Ganglionic level infarction (caudate, lentiform nuclei, internal capsule, insula, M1-M3 cortex): 7 - Supraganglionic infarction (M4-M6 cortex):  3 Total score (0-10 with 10 being normal): 10 No acute intracranial finding. These results were called by telephone at the time of interpretation on 11/04/2023 at 4:45 pm to provider [facility] , who verbally acknowledged these results. IMPRESSION: 1.  No acute intracranial finding. 2. Mild cerebral white matter chronic small vessel ischemic disease. 3. Mild cerebral atrophy. 4. 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 Electronically Signed   By: Bascom Lily D.O.   On: 11/04/2023 16:46        Scheduled Meds:   stroke: early stages of recovery book   Does not apply Once   aspirin  81 mg Oral Daily   atorvastatin  80 mg Oral Daily   buprenorphine  1 patch Transdermal Weekly   clopidogrel  75 mg Oral Daily   enoxaparin  (LOVENOX ) injection  65 mg Subcutaneous Q24H   fluticasone furoate-vilanterol  1 puff Inhalation Daily   insulin  aspart  0-15 Units Subcutaneous TID WC   insulin  aspart  0-5 Units Subcutaneous QHS   insulin  glargine-yfgn  50 Units Subcutaneous QHS   mirabegron ER  50 mg Oral Daily   pantoprazole   20 mg Oral Daily   rOPINIRole  1 mg Oral QHS   sodium chloride  flush  3-10 mL Intravenous Q12H     LOS: 0 days  Time spent: 55 minutes    Rolondo Pierre Loran Rock, DO [facility]  If 7PM-7AM, please contact night-coverage [email or web address] 11/05/2023, 2:33 PM

## 2023-11-05 NOTE — Plan of Care (Signed)

## 2023-11-05 NOTE — Progress Notes (Signed)
   11/05/23 0749  TOC Brief Assessment  Insurance and Status Reviewed  Patient has primary care physician Yes  Home environment has been reviewed Hypokalemia  Prior level of function: Needs assistance  Social Drivers of Health Review SDOH reviewed no interventions necessary  Readmission risk has been reviewed Yes  Transition of care needs no transition of care needs at this time   Observation for facial droop. Transition of Care Department [facility]) has reviewed patient, and no TOC needs have been identified at this time. We will continue to monitor patient advancement through interdisciplinary progressions rounds. If new patient transition needs arise, please place a TOC consult.

## 2023-11-05 NOTE — Evaluation (Signed)
 Physical Therapy Evaluation Patient Details Name: Valerie Wagner MRN: [id number] DOB: 1950-08-18 Today's Date: 11/05/2023  History of Present Illness  Valerie Wagner is a 73 y.o. female with medical history significant of diabetes, hypertension, chronic pain, history of stroke, COPD, obesity.  Patient's prior stroke caused some residual left-sided weakness, particularly in the patient's foot.  She has had to walks with a walker for assistance.  She comes in today with some increased left-sided weakness with some possible left-sided facial droop.  Since her arrival, her symptoms have been improving, although she states that they are not back to her baseline.  There is no slurred speech.  No fevers, chills, nausea, vomiting.]   Clinical Impression  Patient demonstrates decreased strength on left side of body especially LUE, abnormal pain rating in back, impaired balance and difficulty walking. Pt requires mod assistance for bed mobility, functional transfers, and ambulation. Patient demonstrates difficulty with ambulation during today's session with RW decreased stride length and velocity noted. Patient also demonstrates emotion about worsening of left sided weakness and the passing of a life long friend yesterday. Patient requires education on role of PT and the importance of functional mobility. Patient would benefit from skilled physical therapy for increased independence with ambulation, increased strength of left side, and balance for improved gait quality, return to higher level of function with ADLs, and progress towards therapy goals.          If plan is discharge home, recommend the following: A lot of help with walking and/or transfers;A lot of help with bathing/dressing/bathroom;Assistance with cooking/housework;Assist for transportation;Help with stairs or ramp for entrance   Can travel by private vehicle        Equipment Recommendations Rolling walker (2 wheels)  Recommendations for  Other Services       Functional Status Assessment Patient has had a recent decline in their functional status and demonstrates the ability to make significant improvements in function in a reasonable and predictable amount of time.     Precautions / Restrictions Precautions Precautions: Fall Recall of Precautions/Restrictions: Intact Restrictions Weight Bearing Restrictions Per Provider Order: No      Mobility  Bed Mobility Overal bed mobility: Needs Assistance Bed Mobility: Supine to Sit     Supine to sit: Mod assist, HOB elevated, Used rails          Transfers Overall transfer level: Needs assistance Equipment used: Rolling walker (2 wheels) Transfers: Bed to chair/wheelchair/BSC, Sit to/from Stand Sit to Stand: Mod assist   Step pivot transfers: Mod assist            Ambulation/Gait Ambulation/Gait assistance: Mod assist Gait Distance (Feet): 4 Feet Assistive device: Rolling walker (2 wheels) Gait Pattern/deviations: Decreased step length - left, Decreased stride length Gait velocity: decreased     General Gait Details: unsteady, increased dependence on RW  Stairs            Wheelchair Mobility     Tilt Bed    Modified Rankin (Stroke Patients Only)       Balance Overall balance assessment: Needs assistance Sitting-balance support: Single extremity supported Sitting balance-Leahy Scale: Fair     Standing balance support: Bilateral upper extremity supported Standing balance-Leahy Scale: Poor Standing balance comment: AD dependent                             Pertinent Vitals/Pain Pain Assessment Pain Assessment: 0-10 Pain Score: 10-Worst pain ever Pain Location: back Pain Intervention(s):  Limited activity within patient's tolerance, Repositioned    Home Living Family/patient expects to be discharged to:: Assisted living                        Prior Function Prior Level of Function : Needs assist              Mobility Comments: stays in room at assisted living for most of the day ADLs Comments: Assisted living     Extremity/Trunk Assessment   Upper Extremity Assessment Upper Extremity Assessment: Generalized weakness;LUE deficits/detail LUE Deficits / Details: Worsened left sided weakness, abnormal tone of wrist and finger flexors, however pt is able to grip RW    Lower Extremity Assessment Lower Extremity Assessment: Generalized weakness;LLE deficits/detail LLE Deficits / Details: decreased strength compared to the RLE, able to take 4 steps with RW support, AD dependent with WB of LLE    Cervical / Trunk Assessment Cervical / Trunk Assessment: Normal  Communication   Communication Communication: No apparent difficulties    Cognition Arousal: Alert Behavior During Therapy: WFL for tasks assessed/performed   PT - Cognitive impairments: No apparent impairments                         Following commands: Intact       Cueing Cueing Techniques: Verbal cues     General Comments      Exercises     Assessment/Plan    PT Assessment Patient needs continued PT services  PT Problem List Decreased strength;Decreased range of motion;Decreased activity tolerance;Decreased balance;Decreased mobility;Decreased coordination;Pain       PT Treatment Interventions DME instruction;Gait training;Stair training;Functional mobility training;Therapeutic activities;Therapeutic exercise;Balance training;Neuromuscular re-education;Patient/family education    PT Goals (Current goals can be found in the Care Plan section)  Acute Rehab PT Goals Patient Stated Goal: to move to SNF in [place] PT Goal Formulation: With patient Time For Goal Achievement: 11/19/23 Potential to Achieve Goals: Good    Frequency Min 3X/week     Co-evaluation               AM-PAC PT "6 Clicks" Mobility  Outcome Measure Help needed turning from your back to your side while in a flat bed  without using bedrails?: A Lot Help needed moving from lying on your back to sitting on the side of a flat bed without using bedrails?: A Lot Help needed moving to and from a bed to a chair (including a wheelchair)?: A Lot Help needed standing up from a chair using your arms (e.g., wheelchair or bedside chair)?: A Lot Help needed to walk in hospital room?: A Lot Help needed climbing 3-5 steps with a railing? : Total 6 Click Score: 11    End of Session Equipment Utilized During Treatment: Gait belt Activity Tolerance: Patient limited by pain Patient left: in chair;with call bell/phone within reach;with chair alarm set Nurse Communication: Mobility status;Precautions PT Visit Diagnosis: Unsteadiness on feet (R26.81);Muscle weakness (generalized) (M62.81);Difficulty in walking, not elsewhere classified (R26.2)    Time: 1610-9604 PT Time Calculation (min) (ACUTE ONLY): 34 min   Charges:   PT Evaluation $PT Eval Low Complexity: 1 Low PT Treatments $Therapeutic Activity: 8-22 mins PT General Charges $$ ACUTE PT VISIT: 1 Visit         Armond Bertin, PT, DPT [facility] Office: [phone] 3:11 PM, 11/05/23

## 2023-11-05 NOTE — Plan of Care (Signed)
  Problem: Education: Goal: Knowledge of General Education information will improve Description: Including pain rating scale, medication(s)/side effects and non-pharmacologic comfort measures Outcome: Progressing   Problem: Health Behavior/Discharge Planning: Goal: Ability to manage health-related needs will improve Outcome: Progressing   Problem: Ischemic Stroke/TIA Tissue Perfusion: Goal: Complications of ischemic stroke/TIA will be minimized Outcome: Progressing

## 2023-11-05 NOTE — Care Management Obs Status (Signed)
 MEDICARE OBSERVATION STATUS NOTIFICATION   Patient Details  Name: Valerie Wagner MRN: [id number] Date of Birth: [birth year]   Medicare Observation Status Notification Given:   Yes    Lynda Sands, RN 11/05/2023, 7:40 AM

## 2023-11-05 NOTE — Plan of Care (Signed)
  Problem: Acute Rehab PT Goals(only PT should resolve) Goal: Pt Will Go Supine/Side To Sit Outcome: Progressing Flowsheets (Taken 11/05/2023 1512) Pt will go Supine/Side to Sit: with minimal assist Goal: Patient Will Transfer Sit To/From Stand Outcome: Progressing Flowsheets (Taken 11/05/2023 1512) Patient will transfer sit to/from stand: with minimal assist Goal: Pt Will Transfer Bed To Chair/Chair To Bed Outcome: Progressing Flowsheets (Taken 11/05/2023 1512) Pt will Transfer Bed to Chair/Chair to Bed: with min assist Goal: Pt Will Ambulate Outcome: Progressing Flowsheets (Taken 11/05/2023 1512) Pt will Ambulate:  15 feet  with minimal assist  with rolling walker   Armond Bertin, PT, DPT [facility] Office: [phone] 3:12 PM, 11/05/23

## 2023-11-06 ENCOUNTER — Observation Stay (HOSPITAL_COMMUNITY)

## 2023-11-06 DIAGNOSIS — R531 Weakness: Secondary | ICD-10-CM

## 2023-11-06 DIAGNOSIS — G43109 Migraine with aura, not intractable, without status migrainosus: Secondary | ICD-10-CM | POA: Diagnosis not present

## 2023-11-06 DIAGNOSIS — R29810 Facial weakness: Secondary | ICD-10-CM | POA: Diagnosis not present

## 2023-11-06 LAB — GLUCOSE, CAPILLARY
Glucose-Capillary: 141 mg/dL — ABNORMAL HIGH (ref 70–99)
Glucose-Capillary: 186 mg/dL — ABNORMAL HIGH (ref 70–99)
Glucose-Capillary: 271 mg/dL — ABNORMAL HIGH (ref 70–99)
Glucose-Capillary: 243 mg/dL — ABNORMAL HIGH (ref 70–99)

## 2023-11-06 MED ORDER — PROCHLORPERAZINE EDISYLATE 10 MG/2ML IJ SOLN
10.0000 mg | Freq: Once | INTRAMUSCULAR | Status: AC
  Administered 2023-11-06: 10 mg via INTRAVENOUS
  Filled 2023-11-06: qty 2

## 2023-11-06 MED ORDER — DEXAMETHASONE SODIUM PHOSPHATE 10 MG/ML IJ SOLN
10.0000 mg | Freq: Once | INTRAMUSCULAR | Status: AC
  Administered 2023-11-06: 10 mg via INTRAVENOUS
  Filled 2023-11-06: qty 1

## 2023-11-06 MED ORDER — HYDRALAZINE HCL 20 MG/ML IJ SOLN: 10.0000 mg | INTRAMUSCULAR | Status: DC | PRN

## 2023-11-06 MED ORDER — PANTOPRAZOLE SODIUM 40 MG PO TBEC
40.0000 mg | DELAYED_RELEASE_TABLET | Freq: Every day | ORAL | Status: DC
  Administered 2023-11-07: 40 mg via ORAL
  Filled 2023-11-06: qty 1

## 2023-11-06 MED ORDER — MAGNESIUM SULFATE IN D5W 1-5 GM/100ML-% IV SOLN
1.0000 g | Freq: Once | INTRAVENOUS | Status: AC
  Administered 2023-11-06: 1 g via INTRAVENOUS
  Filled 2023-11-06: qty 100

## 2023-11-06 MED ORDER — VALPROATE SODIUM 100 MG/ML IV SOLN
500.0000 mg | Freq: Once | INTRAVENOUS | Status: AC
  Administered 2023-11-06: 500 mg via INTRAVENOUS
  Filled 2023-11-06: qty 5

## 2023-11-06 MED ORDER — POLYETHYLENE GLYCOL 3350 17 G PO PACK
17.0000 g | PACK | Freq: Every day | ORAL | Status: DC
  Administered 2023-11-06: 17 g via ORAL
  Filled 2023-11-06 (×2): qty 1

## 2023-11-06 NOTE — [place] FL2 (Signed)
 [place]  MEDICAID FL2 LEVEL OF CARE FORM     IDENTIFICATION  Patient Name: Valerie Wagner Birthdate: [birth year] Sex: female Admission Date (Current Location): 11/04/2023  [facility] and [place] Number:  [employer] and Address:  [facility],  [address] [place] [address]      Provider Number: [id number]  Attending Physician Name and Address:  Cornelius Dill, DO  Relative Name and Phone Number:       Current Level of Care: Hospital Recommended Level of Care: Skilled Nursing Facility Prior Approval Number:    Date Approved/Denied:   PASRR Number: [id number]  Discharge Plan: SNF    Current Diagnoses: Patient Active Problem List   Diagnosis Date Noted   Facial droop 11/04/2023   Cardiac chest pain 10/12/2023   Dizziness 10/12/2023   DOE (dyspnea on exertion) 02/01/2023   OSA (obstructive sleep apnea) 09/09/2022   (HFpEF) heart failure with preserved ejection fraction (HCC) 09/09/2022   History of stroke 09/09/2022   Dysphagia 08/23/2021   Meningoencephalitis    Acute encephalopathy 06/26/2015   Polyneuropathy 06/26/2015   Severe obesity (BMI >= 40) (HCC) 06/26/2015   Fever 06/26/2015   Type 2 diabetes mellitus without complication, without long-term current use of insulin  (HCC)    Severe sepsis (HCC) 06/25/2015   Chronic pain 06/25/2015   Atypical chest pain 02/09/2012   Essential hypertension, benign 02/09/2012   Mixed hyperlipidemia 02/09/2012    Orientation RESPIRATION BLADDER Height & Weight     Self, Situation, Time, Place  Normal Continent Weight: 297 lb 4.8 oz (134.9 kg) Height:  5\' 6"  (167.6 cm)  BEHAVIORAL SYMPTOMS/MOOD NEUROLOGICAL BOWEL NUTRITION STATUS      Continent Diet (Heart healthy)  AMBULATORY STATUS COMMUNICATION OF NEEDS Skin   Extensive Assist Verbally Normal                       Personal Care Assistance Level of Assistance  Bathing, Feeding, Dressing Bathing Assistance: Limited assistance Feeding  assistance: Independent Dressing Assistance: Limited assistance     Functional Limitations Info  Sight, Hearing, Speech Sight Info: Impaired Hearing Info: Impaired Speech Info: Adequate    SPECIAL CARE FACTORS FREQUENCY  PT (By licensed PT), OT (By licensed OT)     PT Frequency: 5 times weekly OT Frequency: 5 times weekly            Contractures Contractures Info: Not present    Additional Factors Info  Code Status, Allergies Code Status Info: FULL Allergies Info: Nsaids, Bee Venom, Cephalosporins, Chocolate, Darvon (Propoxyphene), Doxycycline, Ivp Dye (Iodinated Contrast Media), Penicillins           Current Medications (11/06/2023):  This is the current hospital active medication list Current Facility-Administered Medications  Medication Dose Route Frequency Provider Last Rate Last Admin    stroke: early stages of recovery book   Does not apply Once Stinson, Jacob J, DO       acetaminophen  (TYLENOL ) tablet 650 mg  650 mg Oral Q4H PRN Stinson, Jacob J, DO   650 mg at 11/05/23 1942   Or   acetaminophen  (TYLENOL ) 160 MG/5ML solution 650 mg  650 mg Per Tube Q4H PRN Stinson, Jacob J, DO       Or   acetaminophen  (TYLENOL ) suppository 650 mg  650 mg Rectal Q4H PRN Stinson, Jacob J, DO       aspirin  chewable tablet 81 mg  81 mg Oral Daily Stinson, Jacob J, DO   81 mg at  11/05/23 0944   atorvastatin  (LIPITOR) tablet 80 mg  80 mg Oral Daily Stinson, Jacob J, DO   80 mg at 11/05/23 0944   [START ON 11/10/2023] buprenorphine  (BUTRANS ) 5 MCG/HR 1 patch  1 patch Transdermal Weekly Stinson, Jacob J, DO       clopidogrel  (PLAVIX ) tablet 75 mg  75 mg Oral Daily Stinson, Jacob J, DO   75 mg at 11/05/23 1610   enoxaparin  (LOVENOX ) injection 65 mg  65 mg Subcutaneous Q24H Hunt, Madison H, RPH   65 mg at 11/05/23 2116   fluticasone  furoate-vilanterol (BREO ELLIPTA ) 200-25 MCG/ACT 1 puff  1 puff Inhalation Daily Stinson, Jacob J, DO   1 puff at 11/06/23 0750   insulin  aspart (novoLOG )  injection 0-15 Units  0-15 Units Subcutaneous TID WC Stinson, Jacob J, DO   2 Units at 11/06/23 0825   insulin  aspart (novoLOG ) injection 0-5 Units  0-5 Units Subcutaneous QHS Stinson, Jacob J, DO       insulin  glargine-yfgn (SEMGLEE ) injection 50 Units  50 Units Subcutaneous QHS Stinson, Jacob J, DO   50 Units at 11/05/23 2115   mirabegron  ER (MYRBETRIQ ) tablet 50 mg  50 mg Oral Daily Stinson, Jacob J, DO   50 mg at 11/05/23 9604   pantoprazole  (PROTONIX ) EC tablet 20 mg  20 mg Oral Daily Stinson, Jacob J, DO   20 mg at 11/05/23 5409   rOPINIRole  (REQUIP ) tablet 1 mg  1 mg Oral QHS Stinson, Jacob J, DO   1 mg at 11/05/23 2115   senna-docusate (Senokot-S) tablet 1 tablet  1 tablet Oral QHS PRN Stinson, Jacob J, DO       sodium chloride  flush (NS) 0.9 % injection 3-10 mL  3-10 mL Intravenous Q12H Stinson, Jacob J, DO   10 mL at 11/05/23 2100   sodium chloride  flush (NS) 0.9 % injection 3-10 mL  3-10 mL Intravenous PRN Stinson, Jacob J, DO         Discharge Medications: Please see discharge summary for a list of discharge medications.  Relevant Imaging Results:  Relevant Lab Results:   Additional Information SSN: [id number], [place]

## 2023-11-06 NOTE — Progress Notes (Signed)
 PROGRESS NOTE    Valerie Wagner  ZOX:[id number] DOB: 06-08-1951 DOA: 11/04/2023 PCP: Leesa Pulling, MD   Brief Narrative:    Valerie Wagner is a 73 y.o. female with medical history significant of diabetes, hypertension, chronic pain, history of stroke, COPD, obesity.  Patient's prior stroke caused some residual left-sided weakness, particularly in the patient's foot.  She has had to walks with a walker for assistance.  She comes in today with some increased left-sided weakness with some possible left-sided facial droop.  She has noted to have some bradycardia as well, but this has improved.  Neurology recommending MRI head angiogram as well as migraine cocktail due to suspicion of migraine with aura and/or TIA.  Assessment & Plan:   Principal Problem:   Facial droop Active Problems:   Essential hypertension, benign   Mixed hyperlipidemia   Chronic pain   Polyneuropathy   Severe obesity (BMI >= 40) (HCC)   Type 2 diabetes mellitus without complication, without long-term current use of insulin  (HCC)   (HFpEF) heart failure with preserved ejection fraction (HCC)   History of stroke  Assessment and Plan:   Facial droop resolved and likely related to TIA versus stroke recrudescence or migraine with aura Observation on telemetry MRI head negative for any acute findings MRI head angiogram pending Carotid Dopplers without significant findings Echocardiogram unremarkable Hemoglobin A1c 6.7%, lipid panel with LDL 64 PT/OT/speech therapy consult recommending SNF and will need placement Aspirin  and plavix  for 3 weeks versus 3 months pending MRI image Migraine cocktail per neurology Type 2 diabetes with polyneuropathy and chronic pain Hold metformin Continue home insulin  regimen with sliding scale insulin  Heart failure with preserved EF Continue Lasix Hypertension Continue antihypertensives Hyperlipidemia Bradycardia Further investigation with repeat EKG and 2D echocardiogram Morbid  obesity BMI 47.99   DVT prophylaxis: Lovenox  Code Status: Full Family Communication: None at bedside Disposition Plan:  Status is: Observation The patient will require care spanning > 2 midnights and should be moved to inpatient because: Patient will require SNF placement.   Consultants:  Neurology  Procedures:  None  Antimicrobials:  None   Subjective: Patient seen and evaluated today with no new acute complaints or concerns.  She is noted to have headache this morning, no acute overnight events.  Neurology planning for further testing today.  PT recommending SNF and patient will require placement.  Objective: Vitals:   11/06/23 0033 11/06/23 0438 11/06/23 0751 11/06/23 1215  BP: (!) 152/63 (!) 156/64  (!) 169/102  Pulse: (!) 55 (!) 53  66  Resp: 18 18  20   Temp: 98.1 F (36.7 C) 98.1 F (36.7 C)  98.1 F (36.7 C)  TempSrc:  Oral  Oral  SpO2: 95% 97% 98% 95%  Weight:      Height:        Intake/Output Summary (Last 24 hours) at 11/06/2023 1319 Last data filed at 11/06/2023 0715 Gross per 24 hour  Intake 480 ml  Output 2600 ml  Net -2120 ml   Filed Weights   11/04/23 1641  Weight: 134.9 kg    Examination:  General exam: Appears calm and comfortable, morbidly obese Respiratory system: Clear to auscultation. Respiratory effort normal. Cardiovascular system: S1 & S2 heard, RRR.  Gastrointestinal system: Abdomen is soft Central nervous system: Alert and awake Extremities: No edema Skin: No significant lesions noted Psychiatry: Flat affect.    Data Reviewed: I have personally reviewed following labs and imaging studies  CBC: Recent Labs  Lab 11/04/23 1622 11/04/23 1657 11/04/23  1700  WBC 10.4  --   --   NEUTROABS  --   --  6.1  HGB 11.5* 12.2  --   HCT 36.9 36.0  --   MCV 92.5  --   --   PLT 294  --   --    Basic Metabolic Panel: Recent Labs  Lab 11/04/23 1622 11/04/23 1657  NA 136 141  K 4.3 4.8  CL 104 104  CO2 27  --   GLUCOSE 82 80   BUN 18 20  CREATININE 0.89 1.00  CALCIUM  9.2  --    GFR: Estimated Creatinine Clearance: 74 mL/min (by C-G formula based on SCr of 1 mg/dL). Liver Function Tests: Recent Labs  Lab 11/04/23 1622  AST 21  ALT 23  ALKPHOS 53  BILITOT 0.6  PROT 6.5  ALBUMIN 3.4*   No results for input(s): "LIPASE", "AMYLASE" in the last 168 hours. No results for input(s): "AMMONIA" in the last 168 hours. Coagulation Profile: Recent Labs  Lab 11/04/23 1622  INR 1.0   Cardiac Enzymes: No results for input(s): "CKTOTAL", "CKMB", "CKMBINDEX", "TROPONINI" in the last 168 hours. BNP (last 3 results) No results for input(s): "PROBNP" in the last 8760 hours. HbA1C: Recent Labs    11/05/23 0449  HGBA1C 6.7*   CBG: Recent Labs  Lab 11/05/23 0741 11/05/23 1132 11/05/23 2048 11/06/23 0807 11/06/23 1137  GLUCAP 73 97 192* 141* 186*   Lipid Profile: Recent Labs    11/05/23 0449  CHOL 119  HDL 34*  LDLCALC 64  TRIG 161  CHOLHDL 3.5   Thyroid  Function Tests: No results for input(s): "TSH", "T4TOTAL", "FREET4", "T3FREE", "THYROIDAB" in the last 72 hours. Anemia Panel: No results for input(s): "VITAMINB12", "FOLATE", "FERRITIN", "TIBC", "IRON", "RETICCTPCT" in the last 72 hours. Sepsis Labs: No results for input(s): "PROCALCITON", "LATICACIDVEN" in the last 168 hours.  No results found for this or any previous visit (from the past 240 hours).       Radiology Studies: ECHOCARDIOGRAM COMPLETE Result Date: 11/05/2023    ECHOCARDIOGRAM REPORT   Patient Name:   Valerie Wagner Date of Exam: 11/05/2023 Medical Rec #:  [id number]    Height:       66.0 in Accession #:    [id number]   Weight:       297.3 lb Date of Birth:  1950-12-27    BSA:          2.367 m Patient Age:    72 years     BP:           159/67 mmHg Patient Gender: F            HR:           53 bpm. Exam Location:  [facility] Procedure: 2D Echo, Cardiac Doppler, Color Doppler and Intracardiac            Opacification Agent (Both  Spectral and Color Flow Doppler were            utilized during procedure). Indications:    TIA  History:        Patient has prior history of Echocardiogram examinations. Stroke                 and COPD, Signs/Symptoms:Dyspnea and Chest Pain; Risk                 Factors:Former Smoker.  Sonographer:    Willey Harrier Referring Phys: [phone] JACOB J STINSON IMPRESSIONS  1. Left ventricular  ejection fraction, by estimation, is 60 to 65%. The left ventricle has normal function. The left ventricle has no regional wall motion abnormalities. There is mild concentric left ventricular hypertrophy. Left ventricular diastolic parameters are consistent with Grade I diastolic dysfunction (impaired relaxation).  2. Right ventricular systolic function is normal. The right ventricular size is mildly enlarged.  3. The mitral valve is normal in structure. Trivial mitral valve regurgitation. No evidence of mitral stenosis.  4. The aortic valve is normal in structure. Aortic valve regurgitation is not visualized. No aortic stenosis is present.  5. The inferior vena cava is normal in size with greater than 50% respiratory variability, suggesting right atrial pressure of 3 mmHg. FINDINGS  Left Ventricle: Left ventricular ejection fraction, by estimation, is 60 to 65%. The left ventricle has normal function. The left ventricle has no regional wall motion abnormalities. The left ventricular internal cavity size was normal in size. There is  mild concentric left ventricular hypertrophy. Left ventricular diastolic parameters are consistent with Grade I diastolic dysfunction (impaired relaxation). Right Ventricle: The right ventricular size is mildly enlarged. No increase in right ventricular wall thickness. Right ventricular systolic function is normal. Left Atrium: Left atrial size was normal in size. Right Atrium: Right atrial size was normal in size. Pericardium: There is no evidence of pericardial effusion. Mitral Valve: The mitral valve is  normal in structure. Trivial mitral valve regurgitation. No evidence of mitral valve stenosis. MV peak gradient, 3.9 mmHg. The mean mitral valve gradient is 1.0 mmHg. Tricuspid Valve: The tricuspid valve is normal in structure. Tricuspid valve regurgitation is trivial. No evidence of tricuspid stenosis. Aortic Valve: The aortic valve is normal in structure. Aortic valve regurgitation is not visualized. No aortic stenosis is present. Aortic valve peak gradient measures 11.0 mmHg. Pulmonic Valve: The pulmonic valve was normal in structure. Pulmonic valve regurgitation is not visualized. No evidence of pulmonic stenosis. Aorta: The aortic root is normal in size and structure. Venous: The inferior vena cava is normal in size with greater than 50% respiratory variability, suggesting right atrial pressure of 3 mmHg. IAS/Shunts: No atrial level shunt detected by color flow Doppler.  LEFT VENTRICLE PLAX 2D LVIDd:         5.20 cm   Diastology LVIDs:         3.10 cm   LV e' medial:    7.18 cm/s LV PW:         1.00 cm   LV E/e' medial:  11.7 LV IVS:        0.90 cm   LV e' lateral:   10.10 cm/s LVOT diam:     2.00 cm   LV E/e' lateral: 8.3 LV SV:         91 LV SV Index:   38 LVOT Area:     3.14 cm  RIGHT VENTRICLE             IVC RV Basal diam:  3.40 cm     IVC diam: 2.00 cm RV S prime:     15.70 cm/s TAPSE (M-mode): 2.5 cm LEFT ATRIUM             Index        RIGHT ATRIUM           Index LA Vol (A2C):   55.7 ml 23.53 ml/m  RA Area:     14.10 cm LA Vol (A4C):   38.9 ml 16.44 ml/m  RA Volume:   38.90 ml  16.44  ml/m LA Biplane Vol: 49.1 ml 20.75 ml/m  AORTIC VALVE AV Area (Vmax): 2.23 cm AV Vmax:        166.00 cm/s AV Peak Grad:   11.0 mmHg LVOT Vmax:      118.00 cm/s LVOT Vmean:     80.100 cm/s LVOT VTI:       0.289 m  AORTA Ao Root diam: 3.30 cm Ao Asc diam:  3.30 cm MITRAL VALVE MV Area (PHT): 2.56 cm    SHUNTS MV Area VTI:   2.44 cm    Systemic VTI:  0.29 m MV Peak grad:  3.9 mmHg    Systemic Diam: 2.00 cm MV Mean  grad:  1.0 mmHg MV Vmax:       0.99 m/s MV Vmean:      52.1 cm/s MV Decel Time: 296 msec MV E velocity: 84.00 cm/s MV A velocity: 95.50 cm/s MV E/A ratio:  0.88 Jules Oar MD Electronically signed by Jules Oar MD Signature Date/Time: 11/05/2023/3:24:46 PM    Final    US  Carotid Bilateral (at [facility] and [facility] only) Result Date: 11/05/2023 CLINICAL DATA:  TIA, negative head MR EXAM: BILATERAL CAROTID DUPLEX ULTRASOUND TECHNIQUE: Martina Sledge scale imaging, color Doppler and duplex ultrasound were performed of bilateral carotid and vertebral arteries in the neck. COMPARISON:  03/01/2016 by report only FINDINGS: Criteria: Quantification of carotid stenosis is based on velocity parameters that correlate the residual internal carotid diameter with NASCET-based stenosis levels, using the diameter of the distal internal carotid lumen as the denominator for stenosis measurement. The following velocity measurements were obtained: RIGHT ICA: 80/22 cm/sec CCA: 60/11 cm/sec SYSTOLIC ICA/CCA RATIO:  1.3 ECA: 118 cm/sec LEFT ICA: 87/15 cm/sec CCA: 75/12 cm/sec SYSTOLIC ICA/CCA RATIO:  1.1 ECA: 103 cm/sec RIGHT CAROTID ARTERY: Mild tortuosity. Smooth intimal thickening in the bulb without high-grade stenosis. Normal waveforms and color Doppler signal throughout. RIGHT VERTEBRAL ARTERY:  Normal flow direction and waveform. LEFT CAROTID ARTERY: Intimal thickening in the common carotid artery and bulb. No significant plaque accumulation or stenosis. Normal waveforms and color Doppler signal. Distal ICA tortuous. LEFT VERTEBRAL ARTERY:  Normal flow direction and waveform. Technologist describes technically difficult study secondary to patient breathing pattern, snoring, body habitus. IMPRESSION: 1. Bilateral carotid intimal thickening without significant plaque or stenosis. 2. Antegrade bilateral vertebral arterial flow. Electronically Signed   By: Nicoletta Barrier M.D.   On: 11/05/2023 11:45   MR BRAIN WO CONTRAST Result Date:  11/05/2023 CLINICAL DATA:  73 year old female code stroke presentation yesterday. EXAM: MRI HEAD WITHOUT CONTRAST TECHNIQUE: Multiplanar, multiecho pulse sequences of the brain and surrounding structures were obtained without intravenous contrast. COMPARISON:  Head CT yesterday.  Brain MRI 06/27/2015. Cervical spine CT 12/08/2009. FINDINGS: Brain: No restricted diffusion to suggest acute infarction. No midline shift, mass effect, evidence of mass lesion, ventriculomegaly, extra-axial collection or acute intracranial hemorrhage. Cervicomedullary junction and pituitary are within normal limits. Mild motion degradation. Martina Sledge and white matter signal is within normal limits for age, minimal scattered nonspecific subcortical white matter T2 and FLAIR hyperintensity. No cortical encephalomalacia or chronic cerebral blood products identified. Deep gray nuclei, brainstem and cerebellum appear negative. Vascular: Major intracranial vascular flow voids not as well evaluated due to motion artifact on axial and coronal T2 imaging, but grossly maintained. Skull and upper cervical spine: Negative for age visible cervical spine. Visualized bone marrow signal is within normal limits. Sinuses/Orbits: Postoperative changes to the right globe since 2017, otherwise negative. Other: Mild chronic mastoid effusions are stable. Left parotid  space rounded, roughly 2.3 cm T2 heterogeneous T1 hypointense soft tissue nodule with diffusion signal (series 4, image 20, series 10 image 18) was visible on the axial CT images in 2011, about 1.2 cm at that time. IMPRESSION: 1. No acute intracranial abnormality. 2. Benign left parotid space salivary neoplasm, present since 2011. Electronically Signed   By: Marlise Simpers M.D.   On: 11/05/2023 09:03   CT HEAD CODE STROKE WO CONTRAST Result Date: 11/04/2023 CLINICAL DATA:  Code stroke. Neuro deficit, acute, stroke suspected. EXAM: CT HEAD WITHOUT CONTRAST TECHNIQUE: Contiguous axial images were obtained from  the base of the skull through the vertex without intravenous contrast. RADIATION DOSE REDUCTION: This exam was performed according to the departmental dose-optimization program which includes automated exposure control, adjustment of the mA and/or kV according to patient size and/or use of iterative reconstruction technique. COMPARISON:  Head CT 02/02/2023. FINDINGS: Brain: Generalized cerebral atrophy. Patchy and ill-defined hypoattenuation within the cerebral white matter, nonspecific but compatible with mild chronic small vessel ischemic disease. There is no acute intracranial hemorrhage. No demarcated cortical infarct. No extra-axial fluid collection. No evidence of an intracranial mass. No midline shift. Vascular: No hyperdense vessel.  Atherosclerotic calcifications. Skull: No calvarial fracture. 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 Sinuses/Orbits: No orbital mass or acute orbital finding. No significant paranasal sinus disease at the imaged levels. 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 ASPECTS (Alberta Stroke Program Early CT Score) - Ganglionic level infarction (caudate, lentiform nuclei, internal capsule, insula, M1-M3 cortex): 7 - Supraganglionic infarction (M4-M6 cortex): 3 Total score (0-10 with 10 being normal): 10 No acute intracranial finding. These results were called by telephone at the time of interpretation on 11/04/2023 at 4:45 pm to provider [facility] , who verbally acknowledged these results. IMPRESSION: 1.  No acute intracranial finding. 2. Mild cerebral white matter chronic small vessel ischemic disease. 3. Mild cerebral atrophy. 4. 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 Electronically Signed   By: Bascom Lily D.O.   On: 11/04/2023 16:46        Scheduled Meds:  aspirin   81 mg Oral Daily   atorvastatin   80 mg Oral Daily   [START ON 11/10/2023] buprenorphine   1 patch  Transdermal Weekly   clopidogrel   75 mg Oral Daily   enoxaparin  (LOVENOX ) injection  65 mg Subcutaneous Q24H   fluticasone  furoate-vilanterol  1 puff Inhalation Daily   insulin  aspart  0-15 Units Subcutaneous TID WC   insulin  aspart  0-5 Units Subcutaneous QHS   insulin  glargine-yfgn  50 Units Subcutaneous QHS   mirabegron  ER  50 mg Oral Daily   [START ON 11/07/2023] pantoprazole   40 mg Oral Daily   polyethylene glycol  17 g Oral Daily   rOPINIRole   1 mg Oral QHS   sodium chloride  flush  3-10 mL Intravenous Q12H     LOS: 0 days    Time spent: 55 minutes    Ezriel Boffa Loran Rock, DO [facility]  If 7PM-7AM, please contact night-coverage [email or web address] 11/06/2023, 1:19 PM

## 2023-11-06 NOTE — Plan of Care (Signed)
  Problem: Acute Rehab OT Goals (only OT should resolve) Goal: Pt. Will Perform Grooming Flowsheets (Taken 11/06/2023 1028) Pt Will Perform Grooming: with modified independence Goal: Pt. Will Perform Upper Body Dressing Flowsheets (Taken 11/06/2023 1028) Pt Will Perform Upper Body Dressing:  with contact guard assist  sitting Goal: Pt. Will Transfer To Toilet Flowsheets (Taken 11/06/2023 1028) Pt Will Transfer to Toilet:  with supervision  stand pivot transfer Goal: Pt/Caregiver Will Perform Home Exercise Program Flowsheets (Taken 11/06/2023 1028) Pt/caregiver will Perform Home Exercise Program:  Increased ROM  Increased strength  Right Upper extremity  Left upper extremity  With minimal assist  Kryssa Risenhoover OT, MOT

## 2023-11-06 NOTE — Consult Note (Signed)
 I connected with  Valerie Wagner on 11/06/23 by a video enabled telemedicine application and verified that I am speaking with the correct person using two identifiers.   I discussed the limitations of evaluation and management by telemedicine. The patient expressed understanding and agreed to proceed.  Location of patient: [facility] Location of physician: [facility]   Neurology Consultation Reason for Consult: Left-sided weakness, headache Referring Physician: Dr Doreene Gammon  CC: Left-sided weakness  History is obtained from: Patient, chart review  HPI: Valerie Wagner is a 73 y.o. female with past medical history of hypertension, hyperlipidemia, type 2 diabetes, suspected meningitis in 2017 (was briefly on Keppra  but not on any antiseizure medications anymore), prior history of stroke with residual left-sided weakness who presented with worsening left-sided weakness.  Patient cannot remember last known normal anymore.  However per chart review symptoms started at around noon on 11/04/2023.  Patient states she noticed holocephalic headache associated with photophobia, felt like her "my brain".  She then felt weak all over however per chart review team was concerned that she was weaker on the left side.  Currently she states she still has a headache reportedly mild and her " legs feel like jello".  States she takes aspirin  81 mg daily.  Denies any other concerns  Last known normal: 5/17 20/2025 around noon Event happened at nursing facility No tPA and thrombectomy as outside window mRS 4 (Walks with a rolling walker at baseline)  ROS: All other systems reviewed and negative except as noted in the HPI.   Past Medical History:  Diagnosis Date   Anxiety    Asthma    Chest pain    Reassuring workup of her time including negative cardiac catheterization and stress testing   COPD (chronic obstructive pulmonary disease) (HCC)    DDD (degenerative disc disease), lumbar     Depression    DJD (degenerative joint disease)    Essential hypertension, benign    Fibromyalgia    GERD (gastroesophageal reflux disease)    IBS (irritable bowel syndrome)    Mixed hyperlipidemia    Neuropathy    Obesity    Seizure (HCC)    Was on meds for 6 months and had no more seizures. No meds now.   Shortness of breath dyspnea    Stroke [facility])    Type 2 diabetes mellitus (HCC)    Urinary incontinence      Family History  Problem Relation Age of Onset   Coronary artery disease Father    Heart attack Father        Died age 36     Social History:  reports that she quit smoking about 41 years ago. Her smoking use included cigarettes. She started smoking about 43 years ago. She has a 2 pack-year smoking history. She has never used smokeless tobacco. She reports that she does not drink alcohol and does not use drugs.   Medications Prior to Admission  Medication Sig Dispense Refill Last Dose/Taking   acetaminophen  (TYLENOL ) 325 MG tablet Take 650 mg by mouth 4 (four) times daily as needed (pain).   Unknown   albuterol  (VENTOLIN  HFA) 108 (90 Base) MCG/ACT inhaler Inhale 2 puffs into the lungs every 4 (four) hours as needed for wheezing or shortness of breath.   Unknown   ANTI-DIARRHEAL 2 MG tablet Take 2 mg by mouth daily as needed for diarrhea or loose stools.   Unknown   aspirin  81 MG chewable tablet Chew 81 mg by  mouth daily.   11/05/2023 at  8:00 AM   atorvastatin  (LIPITOR) 80 MG tablet Take 80 mg by mouth daily.   11/05/2023 Morning   buprenorphine  (BUTRANS ) 5 MCG/HR PTWK Place 1 patch onto the skin every Friday.   11/03/2023   busPIRone (BUSPAR) 5 MG tablet Take 7.5 mg by mouth 2 (two) times daily.   11/05/2023 Morning   cetirizine (ZYRTEC) 10 MG tablet Take 10 mg by mouth at bedtime.   11/04/2023 Evening   CRANBERRY JUICE POWDER PO Take 400 mg by mouth daily.   11/05/2023 Morning   dorzolamide-timolol (COSOPT) 2-0.5 % ophthalmic solution Place 1 drop into the right eye 2 (two)  times daily.   11/05/2023 Morning   Dulaglutide (TRULICITY) 0.75 MG/0.5ML SOPN Inject 0.75 mg into the skin once a week. Sundays.   10/29/2023   DULoxetine (CYMBALTA) 60 MG capsule Take 60 mg by mouth 2 (two) times daily.   11/05/2023 Morning   fluticasone  (FLONASE) 50 MCG/ACT nasal spray Place 2 sprays into both nostrils daily.   11/05/2023 Morning   fluticasone  furoate-vilanterol (BREO ELLIPTA ) 200-25 MCG/ACT AEPB Inhale 1 puff into the lungs daily.   11/05/2023 Morning   furosemide (LASIX) 20 MG tablet Take 20 mg by mouth every Monday, Wednesday, and Friday.   11/03/2023   hydrOXYzine (ATARAX) 25 MG tablet Take 25 mg by mouth at bedtime.   11/04/2023 Evening   Insulin  Glargine (BASAGLAR  KWIKPEN) 100 UNIT/ML Inject 40 Units into the skin at bedtime.   11/04/2023 Evening   lisinopril  (ZESTRIL ) 10 MG tablet Take 10 mg by mouth daily.   11/05/2023 Morning   magnesium  oxide (MAG-OX) 400 (240 Mg) MG tablet Take 1 tablet by mouth daily.   11/05/2023 Morning   Menthol, Topical Analgesic, (BIOFREEZE EX) Apply 1 Application topically 3 (three) times daily as needed (pain).   Unknown   metFORMIN (GLUCOPHAGE) 1000 MG tablet Take 1,000 mg by mouth 2 (two) times daily with a meal.   11/05/2023 Morning   methocarbamol  (ROBAXIN ) 500 MG tablet Take 1 tablet (500 mg total) by mouth every 8 (eight) hours as needed for muscle spasms. 20 tablet 0 Unknown   metoprolol  tartrate (LOPRESSOR ) 25 MG tablet Take 1 tablet (25 mg total) by mouth 2 (two) times daily. 180 tablet 3 11/05/2023 Morning   MYRBETRIQ  50 MG TB24 tablet Take 50 mg by mouth daily.   11/05/2023 Morning   naloxone (NARCAN) nasal spray 4 mg/0.1 mL Place 1 spray into the nose once.   Unknown   nitroGLYCERIN  (NITROSTAT ) 0.4 MG SL tablet Place 1 tablet (0.4 mg total) under the tongue every 5 (five) minutes as needed for chest pain. 25 tablet 3 Unknown   nystatin (MYCOSTATIN/NYSTOP) powder Apply 1 Application topically every 8 (eight) hours as needed (redness/rash).    Unknown   nystatin cream (MYCOSTATIN) Apply 1 Application topically 2 (two) times daily. Top of right breast   11/05/2023 Morning   pantoprazole  (PROTONIX ) 20 MG tablet Take 20 mg by mouth daily.   11/05/2023 Morning   pregabalin  (LYRICA ) 75 MG capsule Take 75 mg by mouth 2 (two) times daily.   11/05/2023 Morning   ranolazine  (RANEXA ) 500 MG 12 hr tablet Take 1 tablet (500 mg total) by mouth 2 (two) times daily. 180 tablet 3 11/05/2023 Morning   rOPINIRole  (REQUIP ) 1 MG tablet Take 1 mg by mouth at bedtime.   11/04/2023 Evening   traZODone (DESYREL) 150 MG tablet Take 150 mg by mouth at bedtime.   11/04/2023 Evening  Vitamin D, Ergocalciferol, (DRISDOL) 1.25 MG (50000 UNIT) CAPS capsule Take 50,000 Units by mouth every Wednesday.   11/01/2023   ZTLIDO  1.8 % PTCH Apply 1 patch topically daily. Remove after 12 hours   11/05/2023 Morning   ACCU-CHEK AVIVA PLUS test strip       [EXPIRED] prednisoLONE acetate (PRED FORTE) 1 % ophthalmic suspension Place 1 drop into the right eye daily.   11/01/2023      Exam: Current vital signs: BP (!) 156/64 (BP Location: Right Arm)   Pulse (!) 53   Temp 98.1 F (36.7 C) (Oral)   Resp 18   Ht 5\' 6"  (1.676 m)   Wt 134.9 kg   SpO2 98%   BMI 47.99 kg/m  Vital signs in last 24 hours: Temp:  [98.1 F (36.7 C)-98.8 F (37.1 C)] 98.1 F (36.7 C) (05/19 0438) Pulse Rate:  [53-72] 53 (05/19 0438) Resp:  [18] 18 (05/19 0438) BP: (152-176)/(59-86) 156/64 (05/19 0438) SpO2:  [95 %-98 %] 98 % (05/19 0751)   Physical Exam  Constitutional: Appears well-developed and well-nourished.  Psych: Affect appropriate to situation Neuro: AO x 3, no aphasia, cranial nerves appear grossly intact, antigravity strength with left hemiparesis, FTN intact bilaterally, decree sensation on left side  NIHSS 2   INPUTS: 1A: Level of consciousness --> 0 = Alert; keenly responsive 1B: Ask month and age --> 0 = Both questions right 1C: 'Blink eyes' & 'squeeze hands' --> 0 = Performs  both tasks 2: Horizontal extraocular movements --> 0 = Normal 3: Visual fields --> 0 = No visual loss 4: Facial palsy --> 0 = Normal symmetry 5A: Left arm motor drift --> 1 = Drift, but doesn't hit bed 5B: Right arm motor drift --> 0 = No drift for 10 seconds 6A: Left leg motor drift --> 0 = No drift for 5 seconds 6B: Right leg motor drift --> 0 = No drift for 5 seconds 7: Limb Ataxia --> 0 = No ataxia 8: Sensation --> 1 = Mild-moderate loss: less sharp/more dull  9: Language/aphasia --> 0 = Normal; no aphasia 10: Dysarthria --> 0 = Normal 11: Extinction/inattention --> 0 = No abnormality    I have reviewed labs in epic and the results pertinent to this consultation are: CBC:  Recent Labs  Lab 11/04/23 1622 11/04/23 1657 11/04/23 1700  WBC 10.4  --   --   NEUTROABS  --   --  6.1  HGB 11.5* 12.2  --   HCT 36.9 36.0  --   MCV 92.5  --   --   PLT 294  --   --     Basic Metabolic Panel:  Lab Results  Component Value Date   NA 141 11/04/2023   K 4.8 11/04/2023   CO2 27 11/04/2023   GLUCOSE 80 11/04/2023   BUN 20 11/04/2023   CREATININE 1.00 11/04/2023   CALCIUM  9.2 11/04/2023   GFRNONAA >60 11/04/2023   GFRAA 54 (L) 07/07/2015   Lipid Panel:  Lab Results  Component Value Date   LDLCALC 64 11/05/2023   HgbA1c:  Lab Results  Component Value Date   HGBA1C 6.7 (H) 11/05/2023   Urine Drug Screen:     Component Value Date/Time   LABOPIA NONE DETECTED 11/04/2023 1946   COCAINSCRNUR NONE DETECTED 11/04/2023 1946   LABBENZ NONE DETECTED 11/04/2023 1946   AMPHETMU NONE DETECTED 11/04/2023 1946   THCU NONE DETECTED 11/04/2023 1946   LABBARB NONE DETECTED 11/04/2023 1946    Alcohol  Level     Component Value Date/Time   [facility] <15 11/04/2023 1700     I have reviewed the images obtained:  CT head without contrast 11/04/2023: No acute abnormality MRI brain without contrast 11/05/2023: No acute abnormalities US  carotid bilateral 11/05/2023: Bilateral carotid intimal  thickening without significant plaque or stenosis. Antegrade bilateral vertebral arterial flow.   ASSESSMENT/PLAN: 73 year old female with history of prior stroke and has a left-sided weakness who presented with worsening left-sided weakness and headache.  Left-sided weakness - Differentials include TIA versus recrudescence of prior stroke symptoms in the setting of infection, migraine with aura  Recommendations: - Will obtain MRI head without contrast to look for intracranial stenosis. - If there is significant intracranial stenosis, recommend aspirin  81 mg daily and Plavix  75 mg daily for 3 months followed by Plavix  75 mg daily - If there is no significant intracranial stenosis, recommend aspirin  81 mg daily and Plavix -milligram daily for 3 weeks followed by Plavix  75 mg daily - LDL 64, current continue current atorvastatin  80 mg daily - TTE did not show any thrombus, no regional wall motion abnormalities, no PFO - Will order migraine cocktail - PT/OT - Goal blood pressure normotension - Follow-up with neurology in 3 months (order placed) - Discussed plan with patient and daughter-in-law at bedside - Discussed plan with Dr. Mason Sole via secure chat   Thank you for allowing us  to participate in the care of this patient. If you have any further questions, please contact  me or neurohospitalist.   Roxy Cordial Epile[facility] neurohospitalist

## 2023-11-06 NOTE — Evaluation (Signed)
 Occupational Therapy Evaluation Patient Details Name: Valerie Wagner MRN: [id number] DOB: [birth year] Today's Date: 11/06/2023   History of Present Illness   Valerie Wagner is a 73 y.o. female with medical history significant of diabetes, hypertension, chronic pain, history of stroke, COPD, obesity.  Patient's prior stroke caused some residual left-sided weakness, particularly in the patient's foot.  She has had to walks with a walker for assistance.  She comes in today with some increased left-sided weakness with some possible left-sided facial droop.  Since her arrival, her symptoms have been improving, although she states that they are not back to her baseline.  There is no slurred speech.  No fevers, chills, nausea, vomiting.     Clinical Impressions Pt agreeable to OT evaluation. Pt assisted for most ADL's at baseline at ALF. Supervision assist for mobility with RW in the room at ALF. Pt required min A for transfer to chair using RW. Min to mod A for bed mobility with HOB elevated. Pt reported being to fatigued to attempt ambulation once at the chair. Pt has L UE weakness and limited function at baseline. Pt left in the chair with family and nurse present. Pt will benefit from continued OT in the hospital and recommended venue below to increase strength, balance, and endurance for safe ADL's.        If plan is discharge home, recommend the following:   A little help with walking and/or transfers;A lot of help with bathing/dressing/bathroom;Assistance with cooking/housework;Assist for transportation;Help with stairs or ramp for entrance     Functional Status Assessment   Patient has had a recent decline in their functional status and demonstrates the ability to make significant improvements in function in a reasonable and predictable amount of time.     Equipment Recommendations   None recommended by OT             Precautions/Restrictions   Precautions Precautions:  Fall Recall of Precautions/Restrictions: Intact Restrictions Weight Bearing Restrictions Per Provider Order: No     Mobility Bed Mobility Overal bed mobility: Needs Assistance Bed Mobility: Supine to Sit     Supine to sit: HOB elevated, Min assist, Mod assist     General bed mobility comments: slow labored movement; hand held assist.    Transfers Overall transfer level: Needs assistance Equipment used: Rolling walker (2 wheels) Transfers: Sit to/from Stand, Bed to chair/wheelchair/BSC Sit to Stand: Min assist     Step pivot transfers: Min assist     General transfer comment: extended time; labored movement; increased pain for EOB to chair transfer with RW      Balance Overall balance assessment: Needs assistance Sitting-balance support: No upper extremity supported, Feet supported Sitting balance-Leahy Scale: Fair Sitting balance - Comments: seated at EOB   Standing balance support: Bilateral upper extremity supported, During functional activity, Reliant on assistive device for balance Standing balance-Leahy Scale: Poor Standing balance comment: poor to fair with AD                           ADL either performed or assessed with clinical judgement   ADL Overall ADL's : Needs assistance/impaired Eating/Feeding: Set up;Sitting   Grooming: Moderate assistance;Sitting   Upper Body Bathing: Moderate assistance;Sitting   Lower Body Bathing: Maximal assistance;Total assistance;Sitting/lateral leans   Upper Body Dressing : Moderate assistance;Sitting   Lower Body Dressing: Maximal assistance;Sitting/lateral leans;Total assistance   Toilet Transfer: Minimal assistance;Rolling walker (2 wheels);Stand-pivot Statistician Details (indicate cue type and  reason): Simualted via EOB to chair Toileting- Clothing Manipulation and Hygiene: Total assistance;Maximal assistance;Bed level;Sit to/from stand         General ADL Comments: Pt too fatigued to ambulate  once moved to the chair.     Vision Baseline Vision/History: 1 Wears glasses Ability to See in Adequate Light: 1 Impaired Patient Visual Report: No change from baseline Vision Assessment?: No apparent visual deficits     Perception Perception: Not tested       Praxis Praxis: Not tested       Pertinent Vitals/Pain Pain Assessment Pain Assessment: 0-10 Pain Score: 8  Pain Location: back and head Pain Descriptors / Indicators: Other (Comment) ("hard") Pain Intervention(s): Limited activity within patient's tolerance, Monitored during session, Repositioned     Extremity/Trunk Assessment Upper Extremity Assessment Upper Extremity Assessment: RUE deficits/detail;LUE deficits/detail RUE Deficits / Details: 3-/5 shoulder flexion; generally weak otherwise. LUE Deficits / Details: L UE weakness at baseline from previous stroke. 2+/5 shoudler flexion and elbow flexion/extesion; 3+/5 grasp; limited funtionally. LUE Coordination: decreased fine motor;decreased gross motor   Lower Extremity Assessment Lower Extremity Assessment: Defer to PT evaluation   Cervical / Trunk Assessment Cervical / Trunk Assessment: Normal   Communication Communication Communication: No apparent difficulties   Cognition Arousal: Alert Behavior During Therapy: WFL for tasks assessed/performed Cognition: No apparent impairments                               Following commands: Intact       Cueing  General Comments   Cueing Techniques: Verbal cues                 Home Living Family/patient expects to be discharged to:: Assisted living                             Home Equipment: Rolling Walker (2 wheels);Hospital bed          Prior Functioning/Environment Prior Level of Function : Needs assist       Physical Assist : ADLs (physical);Mobility (physical) Mobility (physical): Transfers;Bed mobility;Gait;Stairs ADLs (physical):  Bathing;Dressing;Toileting;IADLs Mobility Comments: Pt ambulates in her room with RW and supervision assist. Pt requires assist to get out of her hospital bed at baseline. ADLs Comments: Pt is assited for bathing, dressing, toileting, and IADL's.    OT Problem List: Decreased strength;Decreased range of motion;Decreased activity tolerance;Impaired balance (sitting and/or standing);Obesity   OT Treatment/Interventions: Self-care/ADL training;Therapeutic exercise;Therapeutic activities;Neuromuscular education;Patient/family education;Balance training      OT Goals(Current goals can be found in the care plan section)   Acute Rehab OT Goals Patient Stated Goal: improve function OT Goal Formulation: With patient Time For Goal Achievement: 11/20/23 Potential to Achieve Goals: Good   OT Frequency:  Min 2X/week                  AM-PAC OT "6 Clicks" Daily Activity     Outcome Measure Help from another person eating meals?: A Little Help from another person taking care of personal grooming?: A Lot Help from another person toileting, which includes using toliet, bedpan, or urinal?: A Lot Help from another person bathing (including washing, rinsing, drying)?: A Lot Help from another person to put on and taking off regular upper body clothing?: A Lot Help from another person to put on and taking off regular lower body clothing?: A Lot 6 Click Score: 13  End of Session Equipment Utilized During Treatment: Rolling walker (2 wheels);Gait belt Nurse Communication: Mobility status  Activity Tolerance: Patient tolerated treatment well Patient left: in chair;with call bell/phone within reach;with nursing/sitter in room  OT Visit Diagnosis: Unsteadiness on feet (R26.81);Other abnormalities of gait and mobility (R26.89);Muscle weakness (generalized) (M62.81);Other symptoms and signs involving the nervous system (V78.469)                Time: 6295-2841 OT Time Calculation (min): 14  min Charges:  OT General Charges $OT Visit: 1 Visit OT Evaluation $OT Eval Low Complexity: 1 Low  Kishon Garriga OT, MOT  Thurnell Floss 11/06/2023, 10:25 AM

## 2023-11-06 NOTE — Evaluation (Signed)
 Speech Language Pathology Evaluation Patient Details Name: Valerie Wagner MRN: [id number] DOB: 04/25/1951 Today's Date: 11/06/2023 Time: [id number] [phone]) SLP Time Calculation (min) (ACUTE ONLY): 23 min  Problem List:  Patient Active Problem List   Diagnosis Date Noted   Facial droop 11/04/2023   Cardiac chest pain 10/12/2023   Dizziness 10/12/2023   DOE (dyspnea on exertion) 02/01/2023   OSA (obstructive sleep apnea) 09/09/2022   (HFpEF) heart failure with preserved ejection fraction (HCC) 09/09/2022   History of stroke 09/09/2022   Dysphagia 08/23/2021   Meningoencephalitis    Acute encephalopathy 06/26/2015   Polyneuropathy 06/26/2015   Severe obesity (BMI >= 40) (HCC) 06/26/2015   Fever 06/26/2015   Type 2 diabetes mellitus without complication, without long-term current use of insulin  (HCC)    Severe sepsis (HCC) 06/25/2015   Chronic pain 06/25/2015   Atypical chest pain 02/09/2012   Essential hypertension, benign 02/09/2012   Mixed hyperlipidemia 02/09/2012   Past Medical History:  Past Medical History:  Diagnosis Date   Anxiety    Asthma    Chest pain    Reassuring workup of her time including negative cardiac catheterization and stress testing   COPD (chronic obstructive pulmonary disease) (HCC)    DDD (degenerative disc disease), lumbar    Depression    DJD (degenerative joint disease)    Essential hypertension, benign    Fibromyalgia    GERD (gastroesophageal reflux disease)    IBS (irritable bowel syndrome)    Mixed hyperlipidemia    Neuropathy    Obesity    Seizure (HCC)    Was on meds for 6 months and had no more seizures. No meds now.   Shortness of breath dyspnea    Stroke (HCC)    Type 2 diabetes mellitus (HCC)    Urinary incontinence    Past Surgical History:  Past Surgical History:  Procedure Laterality Date   CARPAL TUNNEL RELEASE Bilateral    Right   CESAREAN SECTION     CHOLECYSTECTOMY     ESOPHAGEAL DILATION N/A 09/21/2021   Procedure:  ESOPHAGEAL DILATION;  Surgeon: Urban Garden, MD;  [facility];  Service: Gastroenterology;  Laterality: N/A;   ESOPHAGOGASTRODUODENOSCOPY (EGD) WITH PROPOFOL  N/A 09/21/2021   Procedure: ESOPHAGOGASTRODUODENOSCOPY (EGD) WITH PROPOFOL ;  Surgeon: Urban Garden, MD;  [facility]: [facility];  Service: Gastroenterology;  Laterality: N/A;   FLEXIBLE SIGMOIDOSCOPY N/A 09/21/2021   Procedure: FLEXIBLE SIGMOIDOSCOPY;  Surgeon: Urban Garden, MD;  Location: [facility];  Service: Gastroenterology;  Laterality: N/A;  [facility] in [facility]   Hysterectomy -- unknown     Left Thumb Surgery Bilateral    RADIOLOGY WITH ANESTHESIA N/A 06/30/2015   Procedure: RADIOLOGY WITH ANESTHESIA;  Surgeon: Robbi Childs, MD;  Location: [facility];  Service: Radiology;  Laterality: N/A;   TONSILLECTOMY     HPI:  Valerie Wagner is a 73 y.o. female with past medical history of hypertension, hyperlipidemia, type 2 diabetes, suspected meningitis in 2017 (was briefly on Keppra  but not on any antiseizure medications anymore), prior history of stroke with residual left-sided weakness who presented with worsening left-sided weakness.  Patient cannot remember last known normal anymore.  However per chart review symptoms started at around noon on 11/04/2023.  Patient states she noticed holocephalic headache associated with photophobia, felt like her "my brain".  She then felt weak all over however per chart review team was concerned that she was weaker on the left side.  Currently she states she  still has a headache reportedly mild and her " legs feel like jello".  States she takes aspirin  81 mg daily. CT head without contrast 11/04/2023: No acute abnormality  MRI brain without contrast 11/05/2023: No acute abnormalities  US  carotid bilateral 11/05/2023: Bilateral carotid intimal thickening without significant plaque or stenosis. Antegrade bilateral vertebral arterial flow.   Assessment / Plan  / Recommendation Clinical Impression  Pt does not presents with overt dysarthria, aphasia, or cognitive deficits, however overall evaluation was somewhat limited due to Pt feeling "nervous" and "sweaty", often hesitating in conversation. She reports previous CVA which impacted her speech and left hand with only mild residual deficits per Pt. Pt able to communicate thoughts at the conversation level with mild hesitiations and comprension WNL. MRI completed yesterday was negative for acute changes, and MRA from today is pending. Pt also reports h/o migraines and is unsure if this is source of communication change. She also indicates improvement today from yesterday. Recommend f/u SLP services at next venue of care to ensure Pt is at baseline for communication skills. No further acute SLP needs identified at this time.    SLP Assessment  SLP Recommendation/Assessment: All further Speech Lanaguage Pathology  needs can be addressed in the next venue of care SLP Visit Diagnosis: Cognitive communication deficit (R41.841)    Recommendations for follow up therapy are one component of a multi-disciplinary discharge planning process, led by the attending physician.  Recommendations may be updated based on patient status, additional functional criteria and insurance authorization.    Follow Up Recommendations  Follow physician's recommendations for discharge plan and follow up therapies    Assistance Recommended at Discharge  PRN  Functional Status Assessment Patient has had a recent decline in their functional status and demonstrates the ability to make significant improvements in function in a reasonable and predictable amount of time.  Frequency and Duration     (evaluation only in acute)      SLP Evaluation Cognition  Overall Cognitive Status: Difficult to assess Arousal/Alertness: Awake/alert Orientation Level: Oriented X4 Year: 2025 Month: May Day of Week: Correct Memory: Appears  intact Awareness: Appears intact Problem Solving: Appears intact Behaviors: Poor frustration tolerance ("nervous") Safety/Judgment: Appears intact       Comprehension  Auditory Comprehension Overall Auditory Comprehension: Appears within functional limits for tasks assessed Yes/No Questions: Within Functional Limits Commands: Within Functional Limits Conversation: Complex Visual Recognition/Discrimination Discrimination: Within Function Limits Reading Comprehension Reading Status: Not tested    Expression Expression Primary Mode of Expression: Verbal Verbal Expression Overall Verbal Expression: Impaired Initiation: No impairment Automatic Speech: Name;Social Response Level of Generative/Spontaneous Verbalization: Conversation Repetition: No impairment Naming: No impairment Pragmatics: No impairment Non-Verbal Means of Communication: Not applicable Other Verbal Expression Comments: Pt seemingly without dysarthria or aphasia, but reports difficulty getting her words out and "sweating" episodes several times today when trying to communicate Written Expression Dominant Hand: Right Written Expression: Not tested   Oral / Motor  Oral Motor/Sensory Function Overall Oral Motor/Sensory Function: Within functional limits Motor Speech Overall Motor Speech: Appears within functional limits for tasks assessed Respiration: Within functional limits Phonation: Normal Resonance: Within functional limits Articulation: Within functional limitis Intelligibility: Intelligible Motor Planning: Witnin functional limits Motor Speech Errors: Not applicable           Thank you,  Claudetta Cuba, CCC-SLP [phone]  Semiyah Newgent 11/06/2023, 5:05 PM

## 2023-11-06 NOTE — TOC Initial Note (Addendum)
 Transition of Care [facility]) - Initial/Assessment Note    Patient Details  Name: Valerie Wagner MRN: [id number] Date of Birth: 1951/02/03  [facility] CM/SW Contact:    Grandville Lax, LCSWA Phone Number: 11/06/2023, 9:14 AM  Clinical Narrative:                 CSW notes per chart review that pt arrived from [facility]. CSW notes PT is recommending SNF for pt. CSW spoke with pt and friend who was in room about SNF. Pt is agreeable and would like to go to SNF at [facility]. CSW to complete and send out SNF referral. TOC to follow.   Addendum  1pm: CSW spoke to pt at bedside to review bed offers. Pt would like to accept bed at [facility] at this time. CSW to send email to [facility] to confirm pts eligibility for [facility] SNF waiver as she is in OBS status and will not meet the needed 3 midnights INP for SNF stay coverage. TOC to follow.   Expected Discharge Plan: Skilled Nursing Facility Barriers to Discharge: Continued Medical Work up   Patient Goals and CMS Choice Patient states their goals for this hospitalization and ongoing recovery are:: go to SNF CMS Medicare.gov Compare Post Acute Care list provided to:: Patient Choice offered to / list presented to : Patient [place] ownership interest in [facility].provided to:: Patient    Expected Discharge Plan and Services In-house Referral: Clinical Social Work Discharge Planning Services: CM Consult   Living arrangements for the past 2 months: Assisted Living Facility                                      Prior Living Arrangements/Services Living arrangements for the past 2 months: Assisted Living Facility Lives with:: Facility Resident Patient language and need for interpreter reviewed:: Yes Do you feel safe going back to the place where you live?: Yes      Need for Family Participation in Patient Care: Yes (Comment) Care giver support system in place?: Yes (comment) Current home services: DME Criminal  Activity/Legal Involvement Pertinent to Current Situation/Hospitalization: No - Comment as needed  Activities of Daily Living   ADL Screening (condition at time of admission) Independently performs ADLs?: (P) Yes (appropriate for developmental age) Is the patient deaf or have difficulty hearing?: (P) No Does the patient have difficulty seeing, even when wearing glasses/contacts?: (P) No Does the patient have difficulty concentrating, remembering, or making decisions?: (P) No  Permission Sought/Granted                  Emotional Assessment Appearance:: Appears stated age Attitude/Demeanor/Rapport: Engaged Affect (typically observed): Accepting Orientation: : Oriented to Situation, Oriented to  Time, Oriented to Self, Oriented to Place Alcohol / Substance Use: Not Applicable Psych Involvement: No (comment)  Admission diagnosis:  Facial droop [R29.810] Acute ischemic stroke [facility]) [I63.9] Patient Active Problem List   Diagnosis Date Noted   Facial droop 11/04/2023   Cardiac chest pain 10/12/2023   Dizziness 10/12/2023   DOE (dyspnea on exertion) 02/01/2023   OSA (obstructive sleep apnea) 09/09/2022   (HFpEF) heart failure with preserved ejection fraction (HCC) 09/09/2022   History of stroke 09/09/2022   Dysphagia 08/23/2021   Meningoencephalitis    Acute encephalopathy 06/26/2015   Polyneuropathy 06/26/2015   Severe obesity (BMI >= 40) (HCC) 06/26/2015   Fever 06/26/2015  Type 2 diabetes mellitus without complication, without long-term current use of insulin  (HCC)    Severe sepsis (HCC) 06/25/2015   Chronic pain 06/25/2015   Atypical chest pain 02/09/2012   Essential hypertension, benign 02/09/2012   Mixed hyperlipidemia 02/09/2012   PCP:  Leesa Pulling, MD Pharmacy:   [facility] - [place], [place] - [address] [place] [address] Phone: [phone] Fax: [phone]  [facility]  - [place], [place] - [address] [place] [address] Phone: [phone] Fax: [phone]  [facility] - [place], [place] - [address] [place] [address] Phone: [phone] Fax: [phone]  [place], [place] - [address] [place] [address] Phone: [phone] Fax: [phone]     Social Drivers of Health (SDOH) Social History: SDOH Screenings   Food Insecurity: No Food Insecurity (12/14/2020)   Received from [facility], [facility]  Housing: Low Risk  (11/04/2023)  Transportation Needs: No Transportation Needs (12/14/2020)   Received from [facility], [facility]  Utilities: Not At Risk (11/04/2023)  Depression (PHQ2-9): Medium Risk (07/15/2020)  Financial Resource Strain: Low Risk  (12/14/2020)   Received from [facility], [facility]  Social Connections: Unknown (11/01/2021)   Received from [facility], [facility]  Tobacco Use: Medium Risk (11/04/2023)   SDOH Interventions:     Readmission Risk Interventions     No data to display

## 2023-11-07 DIAGNOSIS — R29810 Facial weakness: Secondary | ICD-10-CM | POA: Diagnosis not present

## 2023-11-07 LAB — GLUCOSE, CAPILLARY
Glucose-Capillary: 195 mg/dL — ABNORMAL HIGH (ref 70–99)
Glucose-Capillary: 292 mg/dL — ABNORMAL HIGH (ref 70–99)

## 2023-11-07 MED ORDER — BUPRENORPHINE 5 MCG/HR TD PTWK: 1.0000 | MEDICATED_PATCH | TRANSDERMAL | 0 refills | Status: AC

## 2023-11-07 MED ORDER — BUSPIRONE HCL 5 MG PO TABS: 7.5000 mg | ORAL_TABLET | Freq: Two times a day (BID) | ORAL | 0 refills | Status: AC

## 2023-11-07 MED ORDER — ROPINIROLE HCL 1 MG PO TABS: 1.0000 mg | ORAL_TABLET | Freq: Every evening | ORAL | 0 refills | Status: AC

## 2023-11-07 MED ORDER — CLOPIDOGREL BISULFATE 75 MG PO TABS: 75.0000 mg | ORAL_TABLET | Freq: Every day | ORAL | 3 refills | Status: AC

## 2023-11-07 MED ORDER — ASPIRIN 81 MG PO CHEW: 81.0000 mg | CHEWABLE_TABLET | Freq: Every day | ORAL | 0 refills | Status: AC

## 2023-11-07 NOTE — Care Management Obs Status (Signed)
 MEDICARE OBSERVATION STATUS NOTIFICATION   Patient Details  Name: Valerie Wagner MRN: [id number] Date of Birth: [birth year]   Medicare Observation Status Notification Given:  Yes (letter given by Lynda Sands, RN on 11/05/23)    Valerie Wagner 11/07/2023, 2:21 PM

## 2023-11-07 NOTE — Discharge Summary (Signed)
 Physician Discharge Summary  Valerie Wagner YQI:[id number] DOB: 08-16-1950 DOA: 11/04/2023  PCP: Leesa Pulling, MD  Admit date: 11/04/2023  Discharge date: 11/07/2023  Admitted From:Home  Disposition:  SNF  Recommendations for Outpatient Follow-up:  Follow up with PCP in 1-2 weeks Follow-up with neurology with referral sent Remain on aspirin  and Plavix  for 3 weeks as prescribed open Plavix  only thereafter Lovastatin 80 mg daily Hold further use of metoprolol  given some bradycardia during this admission which has now improved Continue other home medications as prior  Home Health: None  Equipment/Devices: None  Discharge Condition:Stable  CODE STATUS: Full  Diet recommendation: Heart Healthy/carb modified  Brief/Interim Summary:  Valerie Wagner is a 73 y.o. female with medical history significant of diabetes, hypertension, chronic pain, history of stroke, COPD, obesity.  Patient's prior stroke caused some residual left-sided weakness, particularly in the patient's foot.  She has had to walks with a walker for assistance.  She comes in today with some increased left-sided weakness with some possible left-sided facial droop.  She has noted to have some bradycardia as well, but this has improved.  Neurology recommending MRI head angiogram as well as migraine cocktail due to suspicion of migraine with aura and/or TIA.  MRI brain with no significant intracranial stenosis.  She will not remain on aspirin  and Plavix  for 3 weeks followed by Plavix  only as well as atorvastatin  as prescribed.  PT has recommended SNF and patient is agreeable and currently has a bed for discharge.  No other acute events or concerns noted.   Discharge Diagnoses:  Principal Problem:   Facial droop Active Problems:   Essential hypertension, benign   Mixed hyperlipidemia   Chronic pain   Polyneuropathy   Severe obesity (BMI >= 40) (HCC)   Type 2 diabetes mellitus without complication, without long-term current  use of insulin  (HCC)   (HFpEF) heart failure with preserved ejection fraction (HCC)   History of stroke  Principal discharge diagnosis: Left-sided weakness with concerns for TIA versus stroke recrudescence and/or migraine.  Discharge Instructions  Discharge Instructions     Ambulatory referral to Neurology   Complete by: As directed    An appointment is requested in approximately: 8 weeks   Diet - low sodium heart healthy   Complete by: As directed    Increase activity slowly   Complete by: As directed       Allergies as of 11/07/2023       Reactions   Nsaids Shortness Of Breath   Bee Venom Other (See Comments)   Unknown    Cephalosporins Other (See Comments)   Unknown    Chocolate Other (See Comments)   Unknown    Darvon [propoxyphene] Other (See Comments)   Unknown    Doxycycline Rash   Ivp Dye [iodinated Contrast Media] Swelling   Penicillins Rash        Medication List     STOP taking these medications    methocarbamol  500 MG tablet Commonly known as: ROBAXIN    metoprolol  tartrate 25 MG tablet Commonly known as: LOPRESSOR    prednisoLONE acetate 1 % ophthalmic suspension Commonly known as: PRED FORTE       TAKE these medications    Accu-Chek Aviva Plus test strip Generic drug: glucose blood   acetaminophen  325 MG tablet Commonly known as: TYLENOL  Take 650 mg by mouth 4 (four) times daily as needed (pain).   albuterol  108 (90 Base) MCG/ACT inhaler Commonly known as: VENTOLIN  HFA Inhale 2 puffs into the lungs every  4 (four) hours as needed for wheezing or shortness of breath.   Anti-Diarrheal 2 MG tablet Generic drug: loperamide Take 2 mg by mouth daily as needed for diarrhea or loose stools.   aspirin  81 MG chewable tablet Chew 1 tablet (81 mg total) by mouth daily for 21 days.   atorvastatin  80 MG tablet Commonly known as: LIPITOR Take 80 mg by mouth daily.   Basaglar  KwikPen 100 UNIT/ML Inject 40 Units into the skin at bedtime.    BIOFREEZE EX Apply 1 Application topically 3 (three) times daily as needed (pain).   buprenorphine  5 MCG/HR Ptwk Commonly known as: BUTRANS  Place 1 patch onto the skin every Friday. Start taking on: Nov 10, 2023   busPIRone 5 MG tablet Commonly known as: BUSPAR Take 1.5 tablets (7.5 mg total) by mouth 2 (two) times daily.   cetirizine 10 MG tablet Commonly known as: ZYRTEC Take 10 mg by mouth at bedtime.   clopidogrel  75 MG tablet Commonly known as: PLAVIX  Take 1 tablet (75 mg total) by mouth daily. Start taking on: Nov 08, 2023   CRANBERRY JUICE POWDER PO Take 400 mg by mouth daily.   dorzolamide-timolol 2-0.5 % ophthalmic solution Commonly known as: COSOPT Place 1 drop into the right eye 2 (two) times daily.   DULoxetine 60 MG capsule Commonly known as: CYMBALTA Take 60 mg by mouth 2 (two) times daily.   fluticasone  50 MCG/ACT nasal spray Commonly known as: FLONASE Place 2 sprays into both nostrils daily.   fluticasone  furoate-vilanterol 200-25 MCG/ACT Aepb Commonly known as: BREO ELLIPTA  Inhale 1 puff into the lungs daily.   furosemide 20 MG tablet Commonly known as: LASIX Take 20 mg by mouth every Monday, Wednesday, and Friday.   hydrOXYzine 25 MG tablet Commonly known as: ATARAX Take 25 mg by mouth at bedtime.   lisinopril  10 MG tablet Commonly known as: ZESTRIL  Take 10 mg by mouth daily.   magnesium  oxide 400 (240 Mg) MG tablet Commonly known as: MAG-OX Take 1 tablet by mouth daily.   metFORMIN 1000 MG tablet Commonly known as: GLUCOPHAGE Take 1,000 mg by mouth 2 (two) times daily with a meal.   Myrbetriq  50 MG Tb24 tablet Generic drug: mirabegron  ER Take 50 mg by mouth daily.   naloxone 4 MG/0.1ML Liqd nasal spray kit Commonly known as: NARCAN Place 1 spray into the nose once.   nitroGLYCERIN  0.4 MG SL tablet Commonly known as: NITROSTAT  Place 1 tablet (0.4 mg total) under the tongue every 5 (five) minutes as needed for chest pain.    nystatin powder Commonly known as: MYCOSTATIN/NYSTOP Apply 1 Application topically every 8 (eight) hours as needed (redness/rash).   nystatin cream Commonly known as: MYCOSTATIN Apply 1 Application topically 2 (two) times daily. Top of right breast   pantoprazole  20 MG tablet Commonly known as: PROTONIX  Take 20 mg by mouth daily.   pregabalin  75 MG capsule Commonly known as: LYRICA  Take 75 mg by mouth 2 (two) times daily.   ranolazine  500 MG 12 hr tablet Commonly known as: Ranexa  Take 1 tablet (500 mg total) by mouth 2 (two) times daily.   rOPINIRole  1 MG tablet Commonly known as: REQUIP  Take 1 tablet (1 mg total) by mouth at bedtime.   traZODone 150 MG tablet Commonly known as: DESYREL Take 150 mg by mouth at bedtime.   Trulicity 0.75 MG/0.5ML Soaj Generic drug: Dulaglutide Inject 0.75 mg into the skin once a week. Sundays.   Vitamin D (Ergocalciferol) 1.25 MG (50000 UNIT)  Caps capsule Commonly known as: DRISDOL Take 50,000 Units by mouth every Wednesday.   ZTlido  1.8 % Ptch Generic drug: Lidocaine  Apply 1 patch topically daily. Remove after 12 hours        Contact information for follow-up providers     Leesa Pulling, MD. Schedule an appointment as soon as possible for a visit in 1 week(s).   Specialty: Family Medicine Contact information: [address] [place] [address] [phone]         [facility]. Go in 3 month(s).   Contact information: [address]     [address] [place]  [address] [phone]             Contact information for after-discharge care     Destination     [facility] Preferred SNF .   Service: Skilled Nursing Contact information: [address] [place]  [address] [phone]                    Allergies  Allergen Reactions   Nsaids Shortness Of Breath   Bee Venom Other (See Comments)    Unknown    Cephalosporins  Other (See Comments)    Unknown    Chocolate Other (See Comments)    Unknown    Darvon [Propoxyphene] Other (See Comments)    Unknown    Doxycycline Rash   Ivp Dye [Iodinated Contrast Media] Swelling   Penicillins Rash    Consultations: Neurology   Procedures/Studies: MR ANGIO HEAD WO CONTRAST Result Date: 11/07/2023 CLINICAL DATA:  Stroke follow-up, weakness EXAM: MRA HEAD WITHOUT CONTRAST TECHNIQUE: Angiographic images of the Circle of Willis were acquired using MRA technique without intravenous contrast. COMPARISON:  Brain MRI from yesterday FINDINGS: Anterior circulation: Motion artifact especially at the carotid siphons, no flow reducing stenosis suspected when allowing for artifact. There is presumably atheromatous irregularity. No major branch occlusion, beading, or aneurysm. Posterior circulation: The vertebral, basilar, and major branch vessels are smoothly contoured and diffusely patent. Negative for aneurysm or vascular malformation. IMPRESSION: No emergent finding or major vessel stenosis. Electronically Signed   By: Ronnette Coke M.D.   On: 11/07/2023 08:31   ECHOCARDIOGRAM COMPLETE Result Date: 11/05/2023    ECHOCARDIOGRAM REPORT   Patient Name:   TAHIRAH SARA Date of Exam: 11/05/2023 Medical Rec #:  [id number]    Height:       66.0 in Accession #:    [id number]   Weight:       297.3 lb Date of Birth:  12-31-1950    BSA:          2.367 m Patient Age:    72 years     BP:           159/67 mmHg Patient Gender: F            HR:           53 bpm. Exam Location:  [facility] Procedure: 2D Echo, Cardiac Doppler, Color Doppler and Intracardiac            Opacification Agent (Both Spectral and Color Flow Doppler were            utilized during procedure). Indications:    TIA  History:        Patient has prior history of Echocardiogram examinations. Stroke                 and COPD, Signs/Symptoms:Dyspnea and Chest Pain; Risk  Factors:Former Smoker.  Sonographer:    Willey Harrier  Referring Phys: [phone] JACOB J STINSON IMPRESSIONS  1. Left ventricular ejection fraction, by estimation, is 60 to 65%. The left ventricle has normal function. The left ventricle has no regional wall motion abnormalities. There is mild concentric left ventricular hypertrophy. Left ventricular diastolic parameters are consistent with Grade I diastolic dysfunction (impaired relaxation).  2. Right ventricular systolic function is normal. The right ventricular size is mildly enlarged.  3. The mitral valve is normal in structure. Trivial mitral valve regurgitation. No evidence of mitral stenosis.  4. The aortic valve is normal in structure. Aortic valve regurgitation is not visualized. No aortic stenosis is present.  5. The inferior vena cava is normal in size with greater than 50% respiratory variability, suggesting right atrial pressure of 3 mmHg. FINDINGS  Left Ventricle: Left ventricular ejection fraction, by estimation, is 60 to 65%. The left ventricle has normal function. The left ventricle has no regional wall motion abnormalities. The left ventricular internal cavity size was normal in size. There is  mild concentric left ventricular hypertrophy. Left ventricular diastolic parameters are consistent with Grade I diastolic dysfunction (impaired relaxation). Right Ventricle: The right ventricular size is mildly enlarged. No increase in right ventricular wall thickness. Right ventricular systolic function is normal. Left Atrium: Left atrial size was normal in size. Right Atrium: Right atrial size was normal in size. Pericardium: There is no evidence of pericardial effusion. Mitral Valve: The mitral valve is normal in structure. Trivial mitral valve regurgitation. No evidence of mitral valve stenosis. MV peak gradient, 3.9 mmHg. The mean mitral valve gradient is 1.0 mmHg. Tricuspid Valve: The tricuspid valve is normal in structure. Tricuspid valve regurgitation is trivial. No evidence of tricuspid stenosis. Aortic  Valve: The aortic valve is normal in structure. Aortic valve regurgitation is not visualized. No aortic stenosis is present. Aortic valve peak gradient measures 11.0 mmHg. Pulmonic Valve: The pulmonic valve was normal in structure. Pulmonic valve regurgitation is not visualized. No evidence of pulmonic stenosis. Aorta: The aortic root is normal in size and structure. Venous: The inferior vena cava is normal in size with greater than 50% respiratory variability, suggesting right atrial pressure of 3 mmHg. IAS/Shunts: No atrial level shunt detected by color flow Doppler.  LEFT VENTRICLE PLAX 2D LVIDd:         5.20 cm   Diastology LVIDs:         3.10 cm   LV e' medial:    7.18 cm/s LV PW:         1.00 cm   LV E/e' medial:  11.7 LV IVS:        0.90 cm   LV e' lateral:   10.10 cm/s LVOT diam:     2.00 cm   LV E/e' lateral: 8.3 LV SV:         91 LV SV Index:   38 LVOT Area:     3.14 cm  RIGHT VENTRICLE             IVC RV Basal diam:  3.40 cm     IVC diam: 2.00 cm RV S prime:     15.70 cm/s TAPSE (M-mode): 2.5 cm LEFT ATRIUM             Index        RIGHT ATRIUM           Index LA Vol (A2C):   55.7 ml 23.53 ml/m  RA Area:  14.10 cm LA Vol (A4C):   38.9 ml 16.44 ml/m  RA Volume:   38.90 ml  16.44 ml/m LA Biplane Vol: 49.1 ml 20.75 ml/m  AORTIC VALVE AV Area (Vmax): 2.23 cm AV Vmax:        166.00 cm/s AV Peak Grad:   11.0 mmHg LVOT Vmax:      118.00 cm/s LVOT Vmean:     80.100 cm/s LVOT VTI:       0.289 m  AORTA Ao Root diam: 3.30 cm Ao Asc diam:  3.30 cm MITRAL VALVE MV Area (PHT): 2.56 cm    SHUNTS MV Area VTI:   2.44 cm    Systemic VTI:  0.29 m MV Peak grad:  3.9 mmHg    Systemic Diam: 2.00 cm MV Mean grad:  1.0 mmHg MV Vmax:       0.99 m/s MV Vmean:      52.1 cm/s MV Decel Time: 296 msec MV E velocity: 84.00 cm/s MV A velocity: 95.50 cm/s MV E/A ratio:  0.88 Jules Oar MD Electronically signed by Jules Oar MD Signature Date/Time: 11/05/2023/3:24:46 PM    Final    US  Carotid Bilateral (at [facility]  and [facility] only) Result Date: 11/05/2023 CLINICAL DATA:  TIA, negative head MR EXAM: BILATERAL CAROTID DUPLEX ULTRASOUND TECHNIQUE: Martina Sledge scale imaging, color Doppler and duplex ultrasound were performed of bilateral carotid and vertebral arteries in the neck. COMPARISON:  03/01/2016 by report only FINDINGS: Criteria: Quantification of carotid stenosis is based on velocity parameters that correlate the residual internal carotid diameter with NASCET-based stenosis levels, using the diameter of the distal internal carotid lumen as the denominator for stenosis measurement. The following velocity measurements were obtained: RIGHT ICA: 80/22 cm/sec CCA: 60/11 cm/sec SYSTOLIC ICA/CCA RATIO:  1.3 ECA: 118 cm/sec LEFT ICA: 87/15 cm/sec CCA: 75/12 cm/sec SYSTOLIC ICA/CCA RATIO:  1.1 ECA: 103 cm/sec RIGHT CAROTID ARTERY: Mild tortuosity. Smooth intimal thickening in the bulb without high-grade stenosis. Normal waveforms and color Doppler signal throughout. RIGHT VERTEBRAL ARTERY:  Normal flow direction and waveform. LEFT CAROTID ARTERY: Intimal thickening in the common carotid artery and bulb. No significant plaque accumulation or stenosis. Normal waveforms and color Doppler signal. Distal ICA tortuous. LEFT VERTEBRAL ARTERY:  Normal flow direction and waveform. Technologist describes technically difficult study secondary to patient breathing pattern, snoring, body habitus. IMPRESSION: 1. Bilateral carotid intimal thickening without significant plaque or stenosis. 2. Antegrade bilateral vertebral arterial flow. Electronically Signed   By: Nicoletta Barrier M.D.   On: 11/05/2023 11:45   MR BRAIN WO CONTRAST Result Date: 11/05/2023 CLINICAL DATA:  73 year old female code stroke presentation yesterday. EXAM: MRI HEAD WITHOUT CONTRAST TECHNIQUE: Multiplanar, multiecho pulse sequences of the brain and surrounding structures were obtained without intravenous contrast. COMPARISON:  Head CT yesterday.  Brain MRI 06/27/2015. Cervical spine CT  12/08/2009. FINDINGS: Brain: No restricted diffusion to suggest acute infarction. No midline shift, mass effect, evidence of mass lesion, ventriculomegaly, extra-axial collection or acute intracranial hemorrhage. Cervicomedullary junction and pituitary are within normal limits. Mild motion degradation. Martina Sledge and white matter signal is within normal limits for age, minimal scattered nonspecific subcortical white matter T2 and FLAIR hyperintensity. No cortical encephalomalacia or chronic cerebral blood products identified. Deep gray nuclei, brainstem and cerebellum appear negative. Vascular: Major intracranial vascular flow voids not as well evaluated due to motion artifact on axial and coronal T2 imaging, but grossly maintained. Skull and upper cervical spine: Negative for age visible cervical spine. Visualized bone marrow signal is within normal limits.  Sinuses/Orbits: Postoperative changes to the right globe since 2017, otherwise negative. Other: Mild chronic mastoid effusions are stable. Left parotid space rounded, roughly 2.3 cm T2 heterogeneous T1 hypointense soft tissue nodule with diffusion signal (series 4, image 20, series 10 image 18) was visible on the axial CT images in 2011, about 1.2 cm at that time. IMPRESSION: 1. No acute intracranial abnormality. 2. Benign left parotid space salivary neoplasm, present since 2011. Electronically Signed   By: Marlise Simpers M.D.   On: 11/05/2023 09:03   CT HEAD CODE STROKE WO CONTRAST Result Date: 11/04/2023 CLINICAL DATA:  Code stroke. Neuro deficit, acute, stroke suspected. EXAM: CT HEAD WITHOUT CONTRAST TECHNIQUE: Contiguous axial images were obtained from the base of the skull through the vertex without intravenous contrast. RADIATION DOSE REDUCTION: This exam was performed according to the departmental dose-optimization program which includes automated exposure control, adjustment of the mA and/or kV according to patient size and/or use of iterative reconstruction  technique. COMPARISON:  Head CT 02/02/2023. FINDINGS: Brain: Generalized cerebral atrophy. Patchy and ill-defined hypoattenuation within the cerebral white matter, nonspecific but compatible with mild chronic small vessel ischemic disease. There is no acute intracranial hemorrhage. No demarcated cortical infarct. No extra-axial fluid collection. No evidence of an intracranial mass. No midline shift. Vascular: No hyperdense vessel.  Atherosclerotic calcifications. Skull: No calvarial fracture. 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 Sinuses/Orbits: No orbital mass or acute orbital finding. No significant paranasal sinus disease at the imaged levels. 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 ASPECTS (Alberta Stroke Program Early CT Score) - Ganglionic level infarction (caudate, lentiform nuclei, internal capsule, insula, M1-M3 cortex): 7 - Supraganglionic infarction (M4-M6 cortex): 3 Total score (0-10 with 10 being normal): 10 No acute intracranial finding. These results were called by telephone at the time of interpretation on 11/04/2023 at 4:45 pm to provider [facility] , who verbally acknowledged these results. IMPRESSION: 1.  No acute intracranial finding. 2. Mild cerebral white matter chronic small vessel ischemic disease. 3. Mild cerebral atrophy. 4. 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 Electronically Signed   By: Bascom Lily D.O.   On: 11/04/2023 16:46     Discharge Exam: Vitals:   11/07/23 0744 11/07/23 0751  BP:  137/70  Pulse:  71  Resp:  18  Temp:  98.2 F (36.8 C)  SpO2: 97% 97%   Vitals:   11/06/23 2140 11/07/23 0335 11/07/23 0744 11/07/23 0751  BP: (!) 158/74 (!) 164/61  137/70  Pulse: 64 61  71  Resp: 20 19  18   Temp: 99.1 F (37.3 C) 98.1 F (36.7 C)  98.2 F (36.8 C)  TempSrc: Oral Oral  Oral  SpO2: 96% 94% 97% 97%  Weight:      Height:        General: Pt is alert,  awake, not in acute distress Cardiovascular: RRR, S1/S2 +, no rubs, no gallops Respiratory: CTA bilaterally, no wheezing, no rhonchi Abdominal: Soft, NT, ND, bowel sounds + Extremities: no edema, no cyanosis    The results of significant diagnostics from this hospitalization (including imaging, microbiology, ancillary and laboratory) are listed below for reference.     Microbiology: No results found for this or any previous visit (from the past 240 hours).   Labs: BNP (last 3 results) No results for input(s): "BNP" in the last 8760 hours. Basic Metabolic Panel: Recent Labs  Lab 11/04/23 1622 11/04/23 1657  NA  136 141  K 4.3 4.8  CL 104 104  CO2 27  --   GLUCOSE 82 80  BUN 18 20  CREATININE 0.89 1.00  CALCIUM  9.2  --    Liver Function Tests: Recent Labs  Lab 11/04/23 1622  AST 21  ALT 23  ALKPHOS 53  BILITOT 0.6  PROT 6.5  ALBUMIN 3.4*   No results for input(s): "LIPASE", "AMYLASE" in the last 168 hours. No results for input(s): "AMMONIA" in the last 168 hours. CBC: Recent Labs  Lab 11/04/23 1622 11/04/23 1657 11/04/23 1700  WBC 10.4  --   --   NEUTROABS  --   --  6.1  HGB 11.5* 12.2  --   HCT 36.9 36.0  --   MCV 92.5  --   --   PLT 294  --   --    Cardiac Enzymes: No results for input(s): "CKTOTAL", "CKMB", "CKMBINDEX", "TROPONINI" in the last 168 hours. BNP: Invalid input(s): "POCBNP" CBG: Recent Labs  Lab 11/06/23 0807 11/06/23 1137 11/06/23 1622 11/06/23 2137 11/07/23 0755  GLUCAP 141* 186* 271* 243* 195*   D-Dimer No results for input(s): "DDIMER" in the last 72 hours. Hgb A1c Recent Labs    11/05/23 0449  HGBA1C 6.7*   Lipid Profile Recent Labs    11/05/23 0449  CHOL 119  HDL 34*  LDLCALC 64  TRIG 161  CHOLHDL 3.5   Thyroid  function studies No results for input(s): "TSH", "T4TOTAL", "T3FREE", "THYROIDAB" in the last 72 hours.  Invalid input(s): "FREET3" Anemia work up No results for input(s): "VITAMINB12", "FOLATE",  "FERRITIN", "TIBC", "IRON", "RETICCTPCT" in the last 72 hours. Urinalysis    Component Value Date/Time   COLORURINE YELLOW 06/25/2015 1113   APPEARANCEUR CLEAR 06/25/2015 1113   LABSPEC 1.025 06/25/2015 1113   PHURINE 5.5 06/25/2015 1113   GLUCOSEU >1000 (A) 06/25/2015 1113   HGBUR MODERATE (A) 06/25/2015 1113   BILIRUBINUR NEGATIVE 06/25/2015 1113   KETONESUR 40 (A) 06/25/2015 1113   PROTEINUR 100 (A) 06/25/2015 1113   UROBILINOGEN 0.2 04/06/2014 2205   NITRITE NEGATIVE 06/25/2015 1113   LEUKOCYTESUR NEGATIVE 06/25/2015 1113   Sepsis Labs Recent Labs  Lab 11/04/23 1622  WBC 10.4   Microbiology No results found for this or any previous visit (from the past 240 hours).   Time coordinating discharge: 35 minutes  SIGNED:   Cornelius Dill, DO [facility] 11/07/2023, 10:18 AM  If 7PM-7AM, please contact night-coverage [email or web address]

## 2023-11-07 NOTE — TOC Transition Note (Signed)
 Transition of Care [facility]) - Discharge Note   Patient Details  Name: Valerie Wagner MRN: [id number] Date of Birth: 1951/05/08  Transition of Care [facility]) CM/SW Contact:  Grandville Lax, LCSWA Phone Number: 11/07/2023, 1:23 PM  Clinical Narrative:    CSW updated that pt is medically stable for D/C to [facility] SNF today. CSW spoke to [place] with admissions who states they now have an open bed and can accept pt. CSW attempted to reach both of pts family contacts to provide an update, unable to reach. CSW sent D/C clinicals via HUB to facility. CSW provided RN with room and report. CSW to call for transport once RN updates if EMS or wheelchair Carloyn Chi is needed. TOC signing off.   Final next level of care: Skilled Nursing Facility Barriers to Discharge: Barriers Resolved   Patient Goals and CMS Choice Patient states their goals for this hospitalization and ongoing recovery are:: go to SNF CMS Medicare.gov Compare Post Acute Care list provided to:: Patient Choice offered to / list presented to : Patient [place] ownership interest in [facility].provided to:: Patient    Discharge Placement              Patient chooses bed at: [facility] Patient to be transferred to facility by: EMS or [place] Name of family member notified: Attempted to reach contacts, no answer Patient and family notified of of transfer: 11/07/23  Discharge Plan and Services Additional resources added to the After Visit Summary for   In-house Referral: Clinical Social Work Discharge Planning Services: CM Consult                                 Social Drivers of Health (SDOH) Interventions SDOH Screenings   Food Insecurity: No Food Insecurity (12/14/2020)   Received from [facility], [facility]  Housing: Low Risk  (11/04/2023)  Transportation Needs: No Transportation Needs (12/14/2020)   Received from [facility], [facility]  Utilities: Not At Risk (11/04/2023)   Depression (PHQ2-9): Medium Risk (07/15/2020)  Financial Resource Strain: Low Risk  (12/14/2020)   Received from [facility], [facility]  Social Connections: Unknown (11/01/2021)   Received from [facility], [facility]  Tobacco Use: Medium Risk (11/04/2023)     Readmission Risk Interventions     No data to display

## 2023-11-07 NOTE — Plan of Care (Signed)
  Problem: Education: Goal: Knowledge of General Education information will improve Description: Including pain rating scale, medication(s)/side effects and non-pharmacologic comfort measures Outcome: Progressing   Problem: Health Behavior/Discharge Planning: Goal: Ability to manage health-related needs will improve Outcome: Progressing   Problem: Clinical Measurements: Goal: Ability to maintain clinical measurements within normal limits will improve Outcome: Progressing   Problem: Safety: Goal: Ability to remain free from injury will improve Outcome: Progressing   Problem: Skin Integrity: Goal: Risk for impaired skin integrity will decrease Outcome: Progressing   Problem: Education: Goal: Ability to describe self-care measures that may prevent or decrease complications (Diabetes Survival Skills Education) will improve Outcome: Progressing Goal: Individualized Educational Video(s) Outcome: Progressing

## 2023-11-27 DIAGNOSIS — R42 Dizziness and giddiness: Secondary | ICD-10-CM | POA: Diagnosis not present

## 2023-11-29 ENCOUNTER — Ambulatory Visit: Payer: Self-pay | Admitting: Internal Medicine

## 2023-12-25 ENCOUNTER — Other Ambulatory Visit: Payer: Self-pay | Admitting: *Deleted

## 2023-12-25 NOTE — Patient Outreach (Signed)
 Mrs. Hinsch utilized [facility] SNF waiver previously. Verified in [facility], Mrs. Novell discharged from [facility] on 12/05/23.   Pablo Hurst, MSN, RN, BSN [place]  [facility], [facility] RN Post- Acute Care Manager Direct Dial: [phone]

## 2024-01-11 ENCOUNTER — Ambulatory Visit: Attending: Student | Admitting: Student

## 2024-01-11 NOTE — Progress Notes (Deleted)
 Cardiology Office Note    Date:  01/11/2024  ID:  Valerie Wagner, DOB Sep 14, 1950, MRN [id number] Cardiologist: Diannah SHAUNNA Maywood, MD { :  History of Present Illness:    Valerie Wagner is a 73 y.o. female with past medical history of HTN, HLD, Type II DM, COPD, prior CVA and history of chest pain (cardiac catheterization in 2012 showed angiographically normal coronary arteries except for mild luminal irregularities, Coronary CTA in 10/2022 showed less than 25% stenosis along the proximal vessels but assessment of distal vessels limited due to significant breathing artifact) who presents to the office today for 44-month follow-up.   She was last examined by Dr. Mallipeddi in 09/2023 and reported still having chest pain with exertion but by review of notes, this had been chronic and occurring for over a decade. Had stable shortness of breath with no acute changes in this. She did report episodic dizziness. In regards to her chest pain, she was started on Ranexa  500 mg twice daily and if still having symptoms, would recommend to proceed with repeat LHC. She was continued on Lopressor  25 mg twice daily, ASA 81 mg daily, Lisinopril  10 mg daily and Atorvastatin  80 mg daily. A 2-week monitor was recommended given her dizziness and this showed predominantly normal sinus rhythm with an average heart rate of 58 bpm. She did have 5 runs of SVT with the longest lasting for 9 beats. Was also noted to have a 3.2% PAC burden and less than 1% PVC burden.  In the interim, she was admitted to [facility] in 10/2023 for evaluation of left-sided weakness and facial droop. Neurology recommended MRI and this showed no significant intracranial abnormalities. Was recommended to be on ASA and Plavix  for 3 weeks followed by Plavix  only. Was ultimately discharged to SNF for rehab.  By review of Care Everywhere, she was admitted to [facility] in [place], [place] on 01/03/2024 for acute metabolic encephalopathy which was felt to be  due to polypharmacy. Did have an AKI with creatinine at 2.56 on admission and improved to 1.18 by most recent labs on 01/05/2024.  ROS: ***  Studies Reviewed:   EKG: EKG is*** ordered today and demonstrates ***   EKG Interpretation Date/Time:    Ventricular Rate:    PR Interval:    QRS Duration:    QT Interval:    QTC Calculation:   R Axis:      Text Interpretation:         NST: 09/2022   Stress ECG is negative for ischemia. PACs are present at baseline and with stress.   LV perfusion is abnormal. There is evidence of ischemia.There is a small reversible perfusion defect with mild reduction in uptake present in the apical anterior location with abnormal wall motion in the defect area consistent with ischemia. There is another small reversible perfusion defect with mild reduction in uptake present in the apical anterolateral location with normal wall motion in the defect area consistent with ischemia.   Left ventricular function is abnormal. Nuclear stress EF: 47 %.   Findings are consistent with ischemia. The study is intermediate risk.  Coronary CTA: 10/2022  Coronary Arteries:  Normal coronary origin.  Right dominance.   Left main: The left main is a large caliber vessel with a normal take off from the left coronary cusp that bifurcates to form a left anterior descending artery and a left circumflex artery. There is minimal calcified plaque (<25%).   Left anterior descending artery: The proximal LAD contain  minimal non-calcified plaque (<25%). The mid and distal segments are non-diagnostic due to artifact.   Left circumflex artery: The LCX is non-dominant. The proximal and mid LCX segments are patent. The distal LCX is non-diagnostic due to breathing artifact. A large OM vessel is seen that is non-diagnostic due breathing artifact.   Right coronary artery: The RCA is dominant with normal take off from the right coronary cusp. The proximal segment appears  patent, significant lab noted. The mid segment is patent. The distal segment is non-diagnostic due to breathing artifact.   Right Atrium: Right atrial size is within normal limits.   Right Ventricle: The right ventricular cavity is dilated.   Left Atrium: Left atrial size is normal in size with no left atrial appendage filling defect.   Left Ventricle: The ventricular cavity size is within normal limits.   Pulmonary arteries: Dilated pulmonary artery suggestive of pulmonary hypertension.   Pulmonary veins: Normal pulmonary venous drainage.   Pericardium: Normal thickness without significant effusion or calcium  present.   Cardiac valves: The aortic valve is trileaflet without significant calcification. The mitral valve is normal without significant calcification.   Aorta: Normal caliber without significant disease.   Extra-cardiac findings: See attached radiology report for non-cardiac structures.  IMPRESSION: 1. Coronary calcium  score of 15.9. This was 54th percentile for age-, sex, and race-matched controls.   2. Normal coronary origin with right dominance.   3. Non-diagnostic study due to breathing artifact. Proximal segments appear patent as described above, but there is limited evaluation of distal vessels due to significant breathing artifact.   4. Dilated pulmonary artery suggestive of pulmonary hypertension.   RECOMMENDATIONS: 1. CAD-RADS N: Non-diagnostic study. Obstructive CAD can't be excluded. Alternative evaluation is recommended.  Echocardiogram: 10/2023 IMPRESSIONS     1. Left ventricular ejection fraction, by estimation, is 60 to 65%. The  left ventricle has normal function. The left ventricle has no regional  wall motion abnormalities. There is mild concentric left ventricular  hypertrophy. Left ventricular diastolic  parameters are consistent with Grade I diastolic dysfunction (impaired  relaxation).   2. Right ventricular systolic function is  normal. The right ventricular  size is mildly enlarged.   3. The mitral valve is normal in structure. Trivial mitral valve  regurgitation. No evidence of mitral stenosis.   4. The aortic valve is normal in structure. Aortic valve regurgitation is  not visualized. No aortic stenosis is present.   5. The inferior vena cava is normal in size with greater than 50%  respiratory variability, suggesting right atrial pressure of 3 mmHg.    Event Monitor: 10/2023   Patch wear time was for 14 days.   Normal sinus rhythm predominantly ranging from 42 to 91 bpm with an average HR 58 bpm.   5 runs of nonsustained SVT, fastest interval lasting 10 beats and the longest interval lasting 9 beats.   No evidence of ventricular arrhythmias, or pauses.  First-degree AV block is present.   3.2% PAC burden and <1% PVC burden.   No patient triggered events noted.  Risk Assessment/Calculations:   {Does this patient have ATRIAL FIBRILLATION?:[phone]} No BP recorded.  {Refresh Note OR Click here to enter BP  :1}***         Physical Exam:   VS:  There were no vitals taken for this visit.   Wt Readings from Last 3 Encounters:  11/04/23 297 lb 4.8 oz (134.9 kg)  10/12/23 288 lb (130.6 kg)  05/10/23 241 lb (109.3 kg)  GEN: Well nourished, well developed in no acute distress NECK: No JVD; No carotid bruits CARDIAC: ***RRR, no murmurs, rubs, gallops RESPIRATORY:  Clear to auscultation without rales, wheezing or rhonchi  ABDOMEN: Appears non-distended. No obvious abdominal masses. EXTREMITIES: No clubbing or cyanosis. No edema.  Distal pedal pulses are 2+ bilaterally.   Assessment and Plan:   1. Coronary artery disease, unspecified vessel or lesion type, unspecified whether angina present, unspecified whether native or transplanted heart ***  2. Essential hypertension ***  3. Hyperlipidemia LDL goal <70 ***  4. History of stroke ***    Signed, Laymon CHRISTELLA Qua, PA-C

## 2024-01-23 NOTE — Procedures (Signed)
 Per appt notes: no data recorded.

## 2024-01-23 NOTE — Procedures (Signed)
 No show

## 2024-01-31 ENCOUNTER — Inpatient Hospital Stay: Admitting: Neurology

## 2024-01-31 ENCOUNTER — Encounter: Payer: Self-pay | Admitting: Neurology
